# Patient Record
Sex: Female | Born: 1935
Health system: Southern US, Community
[De-identification: ages and names within clinical notes are randomized; demographics above are authoritative.]

## PROBLEM LIST (undated history)

## (undated) DIAGNOSIS — L03221 Cellulitis of neck: Secondary | ICD-10-CM

## (undated) DIAGNOSIS — D329 Benign neoplasm of meninges, unspecified: Secondary | ICD-10-CM

## (undated) DIAGNOSIS — L0211 Cutaneous abscess of neck: Secondary | ICD-10-CM

## (undated) DIAGNOSIS — I10 Essential (primary) hypertension: Secondary | ICD-10-CM

## (undated) DIAGNOSIS — N6019 Diffuse cystic mastopathy of unspecified breast: Secondary | ICD-10-CM

## (undated) DIAGNOSIS — S96919A Strain of unspecified muscle and tendon at ankle and foot level, unspecified foot, initial encounter: Secondary | ICD-10-CM

## (undated) DIAGNOSIS — J3089 Other allergic rhinitis: Secondary | ICD-10-CM

## (undated) DIAGNOSIS — L723 Sebaceous cyst: Secondary | ICD-10-CM

## (undated) HISTORY — DX: Diffuse cystic mastopathy of unspecified breast: N60.19

## (undated) HISTORY — DX: Sebaceous cyst: L72.3

## (undated) HISTORY — DX: Strain of unspecified muscle and tendon at ankle and foot level, unspecified foot, initial encounter: S96.919A

## (undated) HISTORY — DX: Essential (primary) hypertension: I10

## (undated) HISTORY — DX: Cutaneous abscess of neck: L02.11

## (undated) HISTORY — DX: Benign neoplasm of meninges, unspecified: D32.9

## (undated) HISTORY — DX: Cellulitis of neck: L03.221

## (undated) HISTORY — PX: COLONOSCOPY: SHX174

## (undated) HISTORY — PX: BREAST LUMPECTOMY: SHX2

## (undated) HISTORY — PX: TONSILLECTOMY: SUR1361

## (undated) HISTORY — PX: OTHER SURGICAL HISTORY: SHX169

## (undated) HISTORY — DX: Other allergic rhinitis: J30.89

---

## 1999-06-10 ENCOUNTER — Ambulatory Visit (HOSPITAL_COMMUNITY): Admission: RE | Admit: 1999-06-10 | Discharge: 1999-06-10 | Payer: Self-pay | Admitting: Neurosurgery

## 1999-06-11 ENCOUNTER — Encounter: Admission: RE | Admit: 1999-06-11 | Discharge: 1999-06-11 | Payer: Self-pay | Admitting: Internal Medicine

## 1999-06-11 ENCOUNTER — Encounter: Payer: Self-pay | Admitting: Internal Medicine

## 2000-06-11 ENCOUNTER — Ambulatory Visit (HOSPITAL_COMMUNITY): Admission: RE | Admit: 2000-06-11 | Discharge: 2000-06-11 | Payer: Self-pay | Admitting: Internal Medicine

## 2001-03-29 ENCOUNTER — Other Ambulatory Visit: Admission: RE | Admit: 2001-03-29 | Discharge: 2001-03-29 | Payer: Self-pay | Admitting: Obstetrics and Gynecology

## 2001-11-28 ENCOUNTER — Encounter: Payer: Self-pay | Admitting: Internal Medicine

## 2001-11-28 ENCOUNTER — Encounter: Admission: RE | Admit: 2001-11-28 | Discharge: 2001-11-28 | Payer: Self-pay | Admitting: Internal Medicine

## 2002-01-24 ENCOUNTER — Encounter: Payer: Self-pay | Admitting: Internal Medicine

## 2002-01-24 ENCOUNTER — Encounter: Admission: RE | Admit: 2002-01-24 | Discharge: 2002-01-24 | Payer: Self-pay | Admitting: Internal Medicine

## 2002-07-06 ENCOUNTER — Other Ambulatory Visit: Admission: RE | Admit: 2002-07-06 | Discharge: 2002-07-06 | Payer: Self-pay | Admitting: Obstetrics and Gynecology

## 2002-07-12 ENCOUNTER — Encounter: Payer: Self-pay | Admitting: Obstetrics and Gynecology

## 2002-07-12 ENCOUNTER — Encounter: Admission: RE | Admit: 2002-07-12 | Discharge: 2002-07-12 | Payer: Self-pay | Admitting: Obstetrics and Gynecology

## 2004-02-07 ENCOUNTER — Ambulatory Visit: Payer: Self-pay | Admitting: Family Medicine

## 2004-02-18 ENCOUNTER — Ambulatory Visit: Payer: Self-pay | Admitting: Internal Medicine

## 2004-06-12 ENCOUNTER — Ambulatory Visit: Payer: Self-pay | Admitting: Internal Medicine

## 2005-08-03 ENCOUNTER — Ambulatory Visit: Payer: Self-pay | Admitting: Internal Medicine

## 2005-08-31 ENCOUNTER — Ambulatory Visit: Payer: Self-pay | Admitting: Internal Medicine

## 2006-01-25 ENCOUNTER — Ambulatory Visit: Payer: Self-pay | Admitting: Internal Medicine

## 2006-05-10 ENCOUNTER — Ambulatory Visit: Payer: Self-pay | Admitting: Internal Medicine

## 2006-09-21 DIAGNOSIS — I10 Essential (primary) hypertension: Secondary | ICD-10-CM | POA: Insufficient documentation

## 2006-09-21 DIAGNOSIS — D320 Benign neoplasm of cerebral meninges: Secondary | ICD-10-CM | POA: Insufficient documentation

## 2006-09-21 DIAGNOSIS — N6019 Diffuse cystic mastopathy of unspecified breast: Secondary | ICD-10-CM | POA: Insufficient documentation

## 2006-10-05 ENCOUNTER — Ambulatory Visit: Payer: Self-pay | Admitting: Internal Medicine

## 2006-10-05 LAB — CONVERTED CEMR LAB
Albumin: 4.1 g/dL (ref 3.5–5.2)
Alkaline Phosphatase: 72 units/L (ref 39–117)
BUN: 9 mg/dL (ref 6–23)
Basophils Absolute: 0 10*3/uL (ref 0.0–0.1)
Cholesterol: 276 mg/dL (ref 0–200)
Direct LDL: 184.8 mg/dL
Eosinophils Absolute: 0.3 10*3/uL (ref 0.0–0.6)
GFR calc Af Amer: 91 mL/min
Hemoglobin: 14.3 g/dL (ref 12.0–15.0)
Lymphocytes Relative: 30 % (ref 12.0–46.0)
MCHC: 34.5 g/dL (ref 30.0–36.0)
Monocytes Absolute: 0.6 10*3/uL (ref 0.2–0.7)
Monocytes Relative: 8.5 % (ref 3.0–11.0)
Neutro Abs: 3.6 10*3/uL (ref 1.4–7.7)
Potassium: 3.6 meq/L (ref 3.5–5.1)
TSH: 1.42 microintl units/mL (ref 0.35–5.50)
Total CHOL/HDL Ratio: 7.7

## 2006-10-18 ENCOUNTER — Telehealth: Payer: Self-pay | Admitting: Internal Medicine

## 2006-10-19 ENCOUNTER — Telehealth: Payer: Self-pay | Admitting: Internal Medicine

## 2006-10-28 ENCOUNTER — Ambulatory Visit: Payer: Self-pay | Admitting: Internal Medicine

## 2006-10-28 DIAGNOSIS — R079 Chest pain, unspecified: Secondary | ICD-10-CM | POA: Insufficient documentation

## 2006-11-09 ENCOUNTER — Encounter: Admission: RE | Admit: 2006-11-09 | Discharge: 2006-11-09 | Payer: Self-pay | Admitting: Internal Medicine

## 2006-11-23 ENCOUNTER — Telehealth: Payer: Self-pay | Admitting: Internal Medicine

## 2007-02-08 ENCOUNTER — Ambulatory Visit: Payer: Self-pay | Admitting: Internal Medicine

## 2007-02-08 DIAGNOSIS — L723 Sebaceous cyst: Secondary | ICD-10-CM | POA: Insufficient documentation

## 2007-02-08 HISTORY — DX: Sebaceous cyst: L72.3

## 2007-03-18 ENCOUNTER — Ambulatory Visit: Payer: Self-pay | Admitting: Internal Medicine

## 2007-03-18 DIAGNOSIS — L0211 Cutaneous abscess of neck: Secondary | ICD-10-CM | POA: Insufficient documentation

## 2007-03-18 DIAGNOSIS — L03221 Cellulitis of neck: Secondary | ICD-10-CM

## 2007-03-18 HISTORY — DX: Cutaneous abscess of neck: L02.11

## 2007-08-02 ENCOUNTER — Ambulatory Visit: Payer: Self-pay

## 2007-08-02 ENCOUNTER — Encounter: Payer: Self-pay | Admitting: Internal Medicine

## 2007-08-02 ENCOUNTER — Ambulatory Visit: Payer: Self-pay | Admitting: Internal Medicine

## 2007-08-02 DIAGNOSIS — M79609 Pain in unspecified limb: Secondary | ICD-10-CM | POA: Insufficient documentation

## 2007-08-04 ENCOUNTER — Emergency Department (HOSPITAL_COMMUNITY): Admission: EM | Admit: 2007-08-04 | Discharge: 2007-08-04 | Payer: Self-pay | Admitting: Emergency Medicine

## 2007-08-05 ENCOUNTER — Telehealth: Payer: Self-pay | Admitting: Internal Medicine

## 2007-08-06 ENCOUNTER — Ambulatory Visit (HOSPITAL_COMMUNITY): Admission: RE | Admit: 2007-08-06 | Discharge: 2007-08-06 | Payer: Self-pay | Admitting: Family Medicine

## 2007-08-06 ENCOUNTER — Ambulatory Visit: Payer: Self-pay | Admitting: Family Medicine

## 2007-08-11 ENCOUNTER — Telehealth: Payer: Self-pay | Admitting: Internal Medicine

## 2007-08-24 ENCOUNTER — Encounter: Payer: Self-pay | Admitting: Internal Medicine

## 2007-09-21 ENCOUNTER — Encounter: Payer: Self-pay | Admitting: Internal Medicine

## 2007-10-27 ENCOUNTER — Encounter: Payer: Self-pay | Admitting: Internal Medicine

## 2007-12-28 ENCOUNTER — Encounter: Payer: Self-pay | Admitting: Internal Medicine

## 2008-02-08 ENCOUNTER — Encounter: Payer: Self-pay | Admitting: Internal Medicine

## 2008-04-30 ENCOUNTER — Ambulatory Visit: Payer: Self-pay | Admitting: Internal Medicine

## 2008-04-30 DIAGNOSIS — M542 Cervicalgia: Secondary | ICD-10-CM | POA: Insufficient documentation

## 2008-04-30 LAB — CONVERTED CEMR LAB
AST: 18 units/L (ref 0–37)
Basophils Absolute: 0 10*3/uL (ref 0.0–0.1)
Basophils Relative: 0.8 % (ref 0.0–3.0)
Calcium: 9.6 mg/dL (ref 8.4–10.5)
Chloride: 99 meq/L (ref 96–112)
Creatinine, Ser: 0.7 mg/dL (ref 0.4–1.2)
Eosinophils Absolute: 0.2 10*3/uL (ref 0.0–0.7)
GFR calc Af Amer: 105 mL/min
GFR calc non Af Amer: 87 mL/min
HCT: 39.2 % (ref 36.0–46.0)
HDL: 37.8 mg/dL — ABNORMAL LOW (ref >39.0)
MCHC: 35.1 g/dL (ref 30.0–36.0)
MCV: 82.3 fL (ref 78.0–100.0)
Monocytes Absolute: 0.4 10*3/uL (ref 0.1–1.0)
Neutrophils Relative %: 57.3 % (ref 43.0–77.0)
Platelets: 221 10*3/uL (ref 150–400)
RBC: 4.77 M/uL (ref 3.87–5.11)
Total Bilirubin: 1.1 mg/dL (ref 0.3–1.2)
Triglycerides: 283 mg/dL (ref 0–149)

## 2008-05-08 ENCOUNTER — Telehealth: Payer: Self-pay | Admitting: Internal Medicine

## 2008-06-06 ENCOUNTER — Telehealth: Payer: Self-pay | Admitting: Internal Medicine

## 2008-06-14 ENCOUNTER — Ambulatory Visit: Payer: Self-pay | Admitting: Internal Medicine

## 2008-06-14 DIAGNOSIS — J3089 Other allergic rhinitis: Secondary | ICD-10-CM | POA: Insufficient documentation

## 2008-06-14 DIAGNOSIS — D179 Benign lipomatous neoplasm, unspecified: Secondary | ICD-10-CM | POA: Insufficient documentation

## 2008-06-14 HISTORY — DX: Other allergic rhinitis: J30.89

## 2008-09-14 ENCOUNTER — Ambulatory Visit: Payer: Self-pay | Admitting: Internal Medicine

## 2008-09-14 DIAGNOSIS — E785 Hyperlipidemia, unspecified: Secondary | ICD-10-CM | POA: Insufficient documentation

## 2008-09-14 DIAGNOSIS — R42 Dizziness and giddiness: Secondary | ICD-10-CM | POA: Insufficient documentation

## 2008-09-14 LAB — CONVERTED CEMR LAB
BUN: 12 mg/dL (ref 6–23)
Direct LDL: 162.6 mg/dL
HDL: 36.6 mg/dL — ABNORMAL LOW (ref >39.00)
Total CHOL/HDL Ratio: 7
VLDL: 57.4 mg/dL — ABNORMAL HIGH (ref 0.0–40.0)

## 2008-09-19 ENCOUNTER — Telehealth: Payer: Self-pay | Admitting: Internal Medicine

## 2008-09-20 ENCOUNTER — Encounter: Admission: RE | Admit: 2008-09-20 | Discharge: 2008-09-20 | Payer: Self-pay | Admitting: Internal Medicine

## 2008-09-21 ENCOUNTER — Encounter: Payer: Self-pay | Admitting: Internal Medicine

## 2008-09-21 ENCOUNTER — Ambulatory Visit: Payer: Self-pay

## 2008-09-24 ENCOUNTER — Telehealth (INDEPENDENT_AMBULATORY_CARE_PROVIDER_SITE_OTHER): Payer: Self-pay | Admitting: *Deleted

## 2008-10-04 ENCOUNTER — Telehealth (INDEPENDENT_AMBULATORY_CARE_PROVIDER_SITE_OTHER): Payer: Self-pay | Admitting: *Deleted

## 2009-02-05 ENCOUNTER — Telehealth: Payer: Self-pay | Admitting: Internal Medicine

## 2009-05-06 ENCOUNTER — Telehealth: Payer: Self-pay | Admitting: Internal Medicine

## 2009-05-07 ENCOUNTER — Ambulatory Visit: Payer: Self-pay | Admitting: Internal Medicine

## 2009-05-09 ENCOUNTER — Ambulatory Visit: Payer: Self-pay | Admitting: Family Medicine

## 2009-05-09 LAB — CONVERTED CEMR LAB
Specific Gravity, Urine: >=1.03
Urobilinogen, UA: 0.2
pH: 5

## 2009-05-10 ENCOUNTER — Encounter: Payer: Self-pay | Admitting: Internal Medicine

## 2009-05-10 LAB — CONVERTED CEMR LAB
Basophils Relative: 0.7 % (ref 0.0–3.0)
Eosinophils Absolute: 0.2 10*3/uL (ref 0.0–0.7)
MCHC: 33.2 g/dL (ref 30.0–36.0)
MCV: 85.2 fL (ref 78.0–100.0)
Monocytes Absolute: 0.5 10*3/uL (ref 0.1–1.0)
Neutrophils Relative %: 59.1 % (ref 43.0–77.0)
Platelets: 226 10*3/uL (ref 150.0–400.0)
RBC: 4.92 M/uL (ref 3.87–5.11)

## 2009-05-11 ENCOUNTER — Encounter: Payer: Self-pay | Admitting: Internal Medicine

## 2009-05-14 ENCOUNTER — Telehealth: Payer: Self-pay | Admitting: Family Medicine

## 2009-06-19 ENCOUNTER — Ambulatory Visit: Payer: Self-pay | Admitting: Internal Medicine

## 2009-06-19 DIAGNOSIS — M8580 Other specified disorders of bone density and structure, unspecified site: Secondary | ICD-10-CM | POA: Insufficient documentation

## 2009-06-19 LAB — CONVERTED CEMR LAB
AST: 19 units/L (ref 0–37)
Albumin: 4.1 g/dL (ref 3.5–5.2)
Alkaline Phosphatase: 66 units/L (ref 39–117)
CO2: 31 meq/L (ref 19–32)
Chloride: 102 meq/L (ref 96–112)
Cholesterol: 257 mg/dL — ABNORMAL HIGH (ref 0–200)
Glucose, Bld: 84 mg/dL (ref 70–99)
Potassium: 3.5 meq/L (ref 3.5–5.1)
Sodium: 142 meq/L (ref 135–145)
Total CHOL/HDL Ratio: 6

## 2009-07-01 ENCOUNTER — Ambulatory Visit (HOSPITAL_COMMUNITY): Admission: RE | Admit: 2009-07-01 | Discharge: 2009-07-01 | Payer: Self-pay | Admitting: Internal Medicine

## 2009-07-01 LAB — HM MAMMOGRAPHY

## 2009-07-11 ENCOUNTER — Telehealth: Payer: Self-pay | Admitting: Internal Medicine

## 2009-07-18 ENCOUNTER — Encounter: Payer: Self-pay | Admitting: Internal Medicine

## 2009-08-14 ENCOUNTER — Telehealth: Payer: Self-pay | Admitting: Internal Medicine

## 2009-10-02 ENCOUNTER — Ambulatory Visit: Payer: Self-pay | Admitting: Family Medicine

## 2009-10-02 DIAGNOSIS — M25569 Pain in unspecified knee: Secondary | ICD-10-CM | POA: Insufficient documentation

## 2009-10-02 DIAGNOSIS — R3911 Hesitancy of micturition: Secondary | ICD-10-CM | POA: Insufficient documentation

## 2009-12-16 ENCOUNTER — Telehealth: Payer: Self-pay | Admitting: Internal Medicine

## 2010-01-14 ENCOUNTER — Ambulatory Visit: Payer: Self-pay | Admitting: Family Medicine

## 2010-01-14 ENCOUNTER — Telehealth (INDEPENDENT_AMBULATORY_CARE_PROVIDER_SITE_OTHER): Payer: Self-pay | Admitting: *Deleted

## 2010-01-14 LAB — CONVERTED CEMR LAB
Basophils Relative: 0.3 % (ref 0.0–3.0)
Eosinophils Relative: 1.7 % (ref 0.0–5.0)
HCT: 40.1 % (ref 36.0–46.0)
Hemoglobin: 13.8 g/dL (ref 12.0–15.0)
Lymphs Abs: 1.8 10*3/uL (ref 0.7–4.0)
MCV: 84.2 fL (ref 78.0–100.0)
Monocytes Absolute: 0.8 10*3/uL (ref 0.1–1.0)
RBC: 4.76 M/uL (ref 3.87–5.11)
WBC: 7.6 10*3/uL (ref 4.5–10.5)

## 2010-01-21 ENCOUNTER — Ambulatory Visit: Payer: Self-pay | Admitting: Internal Medicine

## 2010-01-21 DIAGNOSIS — R739 Hyperglycemia, unspecified: Secondary | ICD-10-CM | POA: Insufficient documentation

## 2010-01-21 DIAGNOSIS — T887XXA Unspecified adverse effect of drug or medicament, initial encounter: Secondary | ICD-10-CM | POA: Insufficient documentation

## 2010-01-21 LAB — CONVERTED CEMR LAB
ALT: 16 units/L (ref 0–35)
Albumin: 4.2 g/dL (ref 3.5–5.2)
CO2: 33 meq/L — ABNORMAL HIGH (ref 19–32)
Calcium: 9.8 mg/dL (ref 8.4–10.5)
Cholesterol: 291 mg/dL — ABNORMAL HIGH (ref 0–200)
Creatinine, Ser: 0.7 mg/dL (ref 0.4–1.2)
Creatinine,U: 64.7 mg/dL
Direct LDL: 195.7 mg/dL
Glucose, Bld: 106 mg/dL — ABNORMAL HIGH (ref 70–99)
HDL: 43.4 mg/dL (ref >39.00)
Total Bilirubin: 0.9 mg/dL (ref 0.3–1.2)
Total CHOL/HDL Ratio: 7
Triglycerides: 251 mg/dL — ABNORMAL HIGH (ref 0.0–149.0)

## 2010-03-23 ENCOUNTER — Encounter: Payer: Self-pay | Admitting: Internal Medicine

## 2010-03-24 ENCOUNTER — Encounter: Payer: Self-pay | Admitting: Internal Medicine

## 2010-04-01 NOTE — Assessment & Plan Note (Signed)
Summary: low abd pain/dm   Vital Signs:  Patient profile:   77 year old female Temp:     97.3 degrees F oral BP sitting:   140 / 80  (left arm) Cuff size:   regular  Vitals Entered By: Sid Falcon LPN (May 09, 2009 2:16 PM) CC: lower abd pain   History of Present Illness: Acute visit. Patient complains of three-day history of intermittent pain left lower abdominal region. Patient concerned about possible UTI but she's not had any symptoms of burning with urination or urinary frequency. Symptoms are fleeting usually lasting a few seconds, sharp quality, and moderate severity. No exacerbating features. No alleviating features. No history of diverticular disease. No history of kidney stones. Denies any fever, chills, or change in bowel habits.  Patient has hypertension and recent blood pressure medications changed. She has significant concerns regarding her elevated blood pressure. She is tolerating Diovan HCTZ well. Reported allergy to sulfa.  Allergies: 1)  Sulfa  Past History:  Past Medical History: Last updated: 04/30/2008 Diabetes mellitus, type II Hypertension fibercystic breast dz meningioma tendon tear of right ankle  Social History: Last updated: 09/21/2006 Retired Married Never Smoked PMH reviewed for relevance, PSH reviewed for relevance  Review of Systems  The patient denies anorexia, fever, weight loss, chest pain, melena, hematochezia, severe indigestion/heartburn, incontinence, and enlarged lymph nodes.    Physical Exam  General:  Well-developed,well-nourished,in no acute distress; alert,appropriate and cooperative throughout examination Mouth:  Oral mucosa and oropharynx without lesions or exudates.  Teeth in good repair. Neck:  No deformities, masses, or tenderness noted. Lungs:  Normal respiratory effort, chest expands symmetrically. Lungs are clear to auscultation, no crackles or wheezes. Heart:  normal rate and regular rhythm.   Abdomen:  soft,  normal bowel sounds, no distention, no masses, no guarding, no rigidity, no hepatomegaly, and no splenomegaly.  minimal tenderness to deep palpation left lower quadrant. Her tenderness is very inconsistent and seemed to diminish with repeat exam. No evidence for inguinal hernia. her minimal tenderness is almost more suprapubic just to the left side. Extremities:  no edema   Impression & Recommendations:  Problem # 1:  SUPRAPUBIC PAIN (ICD-789.09) urine dip reveals only small LE and trace blood of ?significance.  Urine cx and cover with Cipro pending results.   Doubt diverticulitis. Orders: UA Dipstick w/o Micro (manual) (16109) T-Culture, Urine (60454-09811) Venipuncture (91478) TLB-CBC Platelet - w/Differential (85025-CBCD)  Problem # 2:  HYPERTENSION (ICD-401.9) Assessment: Improved  Her updated medication list for this problem includes:    Atenolol 100 Mg Tabs (Atenolol) ..... Once daily    Diovan Hct 320-12.5 Mg Tabs (Valsartan-hydrochlorothiazide) ..... One by mouth daily  Complete Medication List: 1)  Atenolol 100 Mg Tabs (Atenolol) .... Once daily 2)  Bl Lecithin 400 Mg Caps (Lecithin) .... Once daily 3)  Omnaris 50 Mcg/act Susp (Ciclesonide) .... Tw spray q nare daily ( samples) 4)  Vitamin D 400 Unit Caps (Cholecalciferol) .Marland Kitchen.. 1 once daily 5)  Vitamin E 1000 Unit Caps (Vitamin e) .Marland Kitchen.. 1 once daily 6)  Lipoic Acid 150 Mg Caps (Alpha-lipoic acid) .Marland Kitchen.. 1 once daily 7)  Red Yeast Rice 600 Mg Tabs (Red yeast rice extract) .Marland Kitchen.. 1 once daily 8)  Diovan Hct 320-12.5 Mg Tabs (Valsartan-hydrochlorothiazide) .... One by mouth daily 9)  Ciprofloxacin Hcl 250 Mg Tabs (Ciprofloxacin hcl) .... One by mouth two times a day for 7 days  Patient Instructions: 1)  Drink plenty of fluids. 2)  Follow up promptly with Dr.  Lovell Sheehan if you have any fever or worsening symptoms. Prescriptions: CIPROFLOXACIN HCL 250 MG TABS (CIPROFLOXACIN HCL) one by mouth two times a day for 7 days  #14 x 0    Entered and Authorized by:   Evelena Peat MD   Signed by:   Evelena Peat MD on 05/09/2009   Method used:   [facility]  [address] [id number]       [address]       [place], [place]  [address]       Ph: [phone] or [phone]       Fax: [phone]   RxID:   [id number]   Laboratory Results   Urine Tests    Routine Urinalysis   Color: lt. yellow Appearance: Clear Glucose: negative   (Normal Range: Negative) Bilirubin: negative   (Normal Range: Negative) Ketone: negative   (Normal Range: Negative) Spec. Gravity: >=1.030   (Normal Range: 1.003-1.035) Blood: trace-lysed   (Normal Range: Negative) pH: 5.0   (Normal Range: 5.0-8.0) Protein: trace   (Normal Range: Negative) Urobilinogen: 0.2   (Normal Range: 0-1) Nitrite: negative   (Normal Range: Negative) Leukocyte Esterace: small   (Normal Range: Negative)    Comments: Sid Falcon LPN  May 09, 2009 2:37 PM

## 2010-04-01 NOTE — Assessment & Plan Note (Signed)
Summary: foot pain//ccm   Vital Signs:  Patient profile:   77 year old female Height:      68 inches (172.72 cm) Weight:      184.31 pounds (83.78 kg) O2 Sat:      97 % on Room air Temp:     97.6 degrees F (36.44 degrees C) oral Pulse rate:   66 / minute BP sitting:   118 / 84  (left arm) Cuff size:   regular  Vitals Entered By: Josph Macho RMA (January 14, 2010 11:06 AM)  O2 Flow:  Room air CC: Left foot pain X 2 days/ pt fell Saturday doesn't know if this affected foot or not/ CF Is Patient Diabetic? No   History of Present Illness: 77 y/o WF with c/o 2 days of gradually worsening left foot pain. Stumbled over some clutter in her home 2 days ago and had mild pain in left foot briefly.  Pain has gradually become more persistent/severe.  She did not rest her foot in the first 24h after the initial fall, but has had to limp lately and can't do her usual activities.   Denies hx of any inflammitory arthritis.  No fevers, no other joint complaints.    Current Problems (verified): 1)  Foot Pain, Left  (ICD-729.5) 2)  Knee Pain, Right  (ICD-719.46) 3)  Sinusitis, Acute  (ICD-461.9) 4)  Urinary Hesitancy  (ICD-788.64) 5)  Preventive Health Care  (ICD-V70.0) 6)  Osteopenia  (ICD-733.90) 7)  Hyperlipidemia, Atherogenic  (ICD-272.4) 8)  Dizziness, Chronic  (ICD-780.4) 9)  Allergic Rhinitis Due To Other Allergen  (ICD-477.8) 10)  Lipoma  (ICD-214.9) 11)  Neck Pain  (ICD-723.1) 12)  Leg Pain, Right  (ICD-729.5) 13)  Cellulitis and Abscess of Neck  (ICD-682.1) 14)  Sebaceous Cyst, Neck  (ICD-706.2) 15)  Chest Pain  (ICD-786.50) 16)  Preventive Health Care  (ICD-V70.0) 17)  Family History of Colon Ca 1st Degree Relative <60  (ICD-V16.0) 18)  Family History of Cad Female 1st Degree Relative <50  (ICD-V17.3) 19)  Meningioma  (ICD-225.2) 20)  Hypertension  (ICD-401.9) 21)  Diabetes Mellitus, Type II  (ICD-250.00) 22)  Fibrocystic Breast Disease  (ICD-610.1)  Current Medications  (verified): 1)  Atenolol 100 Mg Tabs (Atenolol) .... Once Daily 2)  Vitamin D 400 Unit Caps (Cholecalciferol) .Marland Kitchen.. 1 Once Daily 3)  Vitamin E 1000 Unit Caps (Vitamin E) .Marland Kitchen.. 1 Once Daily 4)  Lipoic Acid 150 Mg Caps (Alpha-Lipoic Acid) .Marland Kitchen.. 1 Once Daily 5)  Red Yeast Rice 600 Mg Tabs (Red Yeast Rice Extract) .Marland Kitchen.. 1 Once Daily 6)  Losartan Potassium-Hctz 100-12.5 Mg Tabs (Losartan Potassium-Hctz) .Marland Kitchen.. 1 Once Daily  Allergies (verified): 1)  Sulfa  Past History:  Past medical, surgical, family and social histories (including risk factors) reviewed, and no changes noted (except as noted below).  Past Medical History: Reviewed history from 04/30/2008 and no changes required. Diabetes mellitus, type II Hypertension fibercystic breast dz meningioma tendon tear of right ankle  Past Surgical History: Reviewed history from 04/30/2008 and no changes required. Lumpectomy Tonsillectomy tendon tear right achilles  Family History: Reviewed history from 09/21/2006 and no changes required. Family History of CAD Female 1st degree relative  98 Family History of Colon CA 1st degree relative <60 mother Family History of Stroke F 1st degree relative <60  Social History: Reviewed history from 09/21/2006 and no changes required. Retired Married Never Smoked  Physical Exam  General:  VS: noted, all normal. Gen: Alert, well appearing, oriented x 4.  Left foot: subtle soft tissue fullness on mid dorsum of foot, with mild/mod tenderness here and mild warmth to touch.  No erythema.  ROM of foot and ankle are intact.  Skin pink/warm.     Impression & Recommendations:  Problem # 1:  FOOT PAIN, LEFT (ICD-729.5) Contusion vs first episode of gout---contusion more likely. Joint fluid not really easily accessible in this case.  Will check uric acid, cbc, and left foot x-ray. Rx'd diclofenac 75mg  two times a day x 7d and rx'd a post op shoe to wear on left foot when bearing wt/ambulating.    Complete  Medication List: 1)  Atenolol 100 Mg Tabs (Atenolol) .... Once daily 2)  Vitamin D 400 Unit Caps (Cholecalciferol) .Marland Kitchen.. 1 once daily 3)  Vitamin E 1000 Unit Caps (Vitamin e) .Marland Kitchen.. 1 once daily 4)  Lipoic Acid 150 Mg Caps (Alpha-lipoic acid) .Marland Kitchen.. 1 once daily 5)  Red Yeast Rice 600 Mg Tabs (Red yeast rice extract) .Marland Kitchen.. 1 once daily 6)  Losartan Potassium-hctz 100-12.5 Mg Tabs (Losartan potassium-hctz) .Marland Kitchen.. 1 once daily 7)  Diclofenac Sodium 75 Mg Tbec (Diclofenac sodium) .Marland Kitchen.. 1 tab by mouth two times a day x 7d 8)  Post Op Shoe -left Foot  .... Wear when ambulating or bearing weight on left foot  Other Orders: TLB-CBC Platelet - w/Differential (85025-CBCD) TLB-Uric Acid, Blood (84550-URIC) T-Foot Left Min 3 Views (73630TC) Venipuncture (47425) Specimen Handling (95638)  Patient Instructions: 1)  Please schedule a follow-up appointment in 1 week.  Prescriptions: POST OP SHOE -LEFT FOOT Wear when ambulating or bearing weight on left foot  #1 x 0   Entered and Authorized by:   Michell Heinrich M.D.   Signed by:   Michell Heinrich M.D. on 01/14/2010   Method used:   Print then Give to Patient   RxID:   [id number] DICLOFENAC SODIUM 75 MG TBEC (DICLOFENAC SODIUM) 1 tab by mouth two times a day x 7d  #14 x 0   Entered and Authorized by:   Michell Heinrich M.D.   Signed by:   Michell Heinrich M.D. on 01/14/2010   Method used:   [facility]        [facility]  [address] [id number]       [address]       [place], [place]  [address]       Ph: [phone] or [phone]       Fax: [phone]   RxID:   [id number]    Orders Added: 1)  Est. Patient Level III [62703] 2)  TLB-CBC Platelet - w/Differential [85025-CBCD] 3)  TLB-Uric Acid, Blood [84550-URIC] 4)  T-Foot Left Min 3 Views [73630TC] 5)  Venipuncture [50093] 6)  Specimen Handling [99000]

## 2010-04-01 NOTE — Consult Note (Signed)
Summary: [facility]  [facility]   Imported By: Maryln Gottron 08/20/2009 13:46:44  _____________________________________________________________________  External Attachment:    Type:   Image     Comment:   External Document

## 2010-04-01 NOTE — Progress Notes (Signed)
Summary: Lab and xray results  Phone Note Other Incoming   Summary of Call: Please call pt and let her know that her x-ray showed NO FRACTURE.  It was a completely normal x-ray. Also, her blood tests were all normal.  Thanks--PM Initial call taken by: Michell Heinrich M.D.,  January 14, 2010 5:31 PM  Follow-up for Phone Call        Patient informed Follow-up by: Josph Macho RMA,  January 15, 2010 8:20 AM

## 2010-04-01 NOTE — Progress Notes (Signed)
Summary: lab results  Phone Note Call from Patient   Caller: Patient Call For: Stacie Glaze MD Reason for Call: Privacy/Consent Authorization Summary of Call: Pt is asking for lab results. [phone] Initial call taken by: Lynann Beaver CMA,  May 14, 2009 3:00 PM  Follow-up for Phone Call        CBC normal and urine cx neg. Follow-up by: Evelena Peat MD,  May 14, 2009 4:06 PM  Additional Follow-up for Phone Call Additional follow up Details #1::        Pt given results. Additional Follow-up by: Lynann Beaver CMA,  May 14, 2009 4:23 PM

## 2010-04-01 NOTE — Assessment & Plan Note (Signed)
Summary: SORE THROAT // RS   Vital Signs:  Patient profile:   77 year old female Height:      68 inches (172.72 cm) Weight:      184.31 pounds (83.78 kg) O2 Sat:      98 % on Room air Temp:     97.6 degrees F (36.44 degrees C) oral Pulse rate:   61 / minute BP sitting:   142 / 90  (left arm) Cuff size:   regular  Vitals Entered By: Josph Macho RMA (October 02, 2009 10:24 AM)  O2 Flow:  Room air  Serial Vital Signs/Assessments:  Time      Position  BP       Pulse  Resp  Temp     By                     142/82                         Danise Edge MD  CC: Sore throat X8 days- cough with phlegm "slightly green"/ CF Is Patient Diabetic? Yes   History of Present Illness: Patient in today with symptoms which began last Tuesday 8 days ago. She started to notice irritation in her throat initially and eventually developed actual pain, worse with swallowing but present all day to a degree. Since then she has developed worsening nasal congestion productive of green rhinorrhea, cough, PND, facial pressure and some right ear pain. She notes an intermittent cough but denies any CP/palp/SOB/wheezing or GI c/o. No n/v/diarrhea/constipation or reflux.  She does note a couple month history of urinary hesitancy, with difficulty initiating a stream at times. She denies any dysuria/hematuria/frequency/urgency/abdominal or back pain. No sensation of prolapse or incontinence. Her final c/o is some right knee pain present for a couple weeks. She reports it started after a drive to  [place] and back. She denies any trauma and does acknowledge she has had some trouble with this knee in the past. She denies any calf pain. No redness/swelling or warmth in knee.  Current Medications (verified): 1)  Atenolol 100 Mg Tabs (Atenolol) .... Once Daily 2)  Bl Lecithin 400 Mg  Caps (Lecithin) .... Once Daily 3)  Omnaris 50 Mcg/act Susp (Ciclesonide) .... Tw Spray Q Nare Daily ( Samples) 4)  Vitamin D 400 Unit  Caps (Cholecalciferol) .Marland Kitchen.. 1 Once Daily 5)  Vitamin E 1000 Unit Caps (Vitamin E) .Marland Kitchen.. 1 Once Daily 6)  Lipoic Acid 150 Mg Caps (Alpha-Lipoic Acid) .Marland Kitchen.. 1 Once Daily 7)  Red Yeast Rice 600 Mg Tabs (Red Yeast Rice Extract) .Marland Kitchen.. 1 Once Daily 8)  Micardis Hct 80-12.5 Mg Tabs (Telmisartan-Hctz) .... One By Mouth Daily  Allergies (verified): 1)  Sulfa  Past History:  Past medical history reviewed for relevance to current acute and chronic problems. Social history (including risk factors) reviewed for relevance to current acute and chronic problems.  Past Medical History: Reviewed history from 04/30/2008 and no changes required. Diabetes mellitus, type II Hypertension fibercystic breast dz meningioma tendon tear of right ankle  Social History: Reviewed history from 09/21/2006 and no changes required. Retired Married Never Smoked  Review of Systems      See HPI  Physical Exam  General:  Well-developed,well-nourished,in no acute distress; alert,appropriate and cooperative throughout examination Head:  Normocephalic and atraumatic without obvious abnormalities. Ears:  slight clear fluid behind b/l tympanic membranes. No erythema or pustular component Nose:  External nasal examination  shows no deformity or inflammation. Nasal mucosa are pink and moist without lesions or exudates. Mouth:  Oral mucosa and oropharynx without lesions or exudates.  Neck:  No deformities, masses, or tenderness noted. Lungs:  Normal respiratory effort, chest expands symmetrically. Lungs are clear to auscultation, no crackles or wheezes. Heart:  Normal rate and regular rhythm. S1 and S2 normal without gallop, murmur, click, rub or other extra sounds. Abdomen:  Bowel sounds positive,abdomen soft and non-tender without masses, organomegaly or hernias noted. Msk:  No deformity or scoliosis noted of thoracic or lumbar spine.   Extremities:  No clubbing, cyanosis, edema, or deformity noted. No swelling, warmth or  redness in knees, no joint line tenderness or ligamentous laxity. No calf swelling, redness or nodule Skin:  Intact without suspicious lesions or rashes Psych:  Cognition and judgment appear intact. Alert and cooperative with normal attention span and concentration. No apparent delusions, illusions, hallucinations   Impression & Recommendations:  Problem # 1:  SINUSITIS, ACUTE (ICD-461.9)  Her updated medication list for this problem includes:     Cefdinir 300 Mg Caps (Cefdinir) .Marland Kitchen... 1 cap by mouth two times a day x 10days    Flonase 50 Mcg/act Susp (Fluticasone propionate) .Marland Kitchen... 2 sprays each nostril daily as needed congestion Encouraged Claritin daily for the next week and then prn  Problem # 2:  URINARY HESITANCY (EXB-284.13)  Orders: Urinalysis-dipstick only (Medicare patient) (24401UU) Tried to get sample today but patient unable to provide, will treat with Cefdinir if symptoms do not resolve she may come back and leave a sample for further consideration.  Problem # 3:  HYPERTENSION (ICD-401.9)  Her updated medication list for this problem includes:    Atenolol 100 Mg Tabs (Atenolol) ..... Once daily    Micardis Hct 80-12.5 Mg Tabs (Telmisartan-hctz) ..... One by mouth daily Mild elevation with acute illness, no change in therapy today  Complete Medication List: 1)  Atenolol 100 Mg Tabs (Atenolol) .... Once daily 2)  Bl Lecithin 400 Mg Caps (Lecithin) .... Once daily 3)  Omnaris 50 Mcg/act Susp (Ciclesonide) .... Tw spray q nare daily ( samples) 4)  Vitamin D 400 Unit Caps (Cholecalciferol) .Marland Kitchen.. 1 once daily 5)  Vitamin E 1000 Unit Caps (Vitamin e) .Marland Kitchen.. 1 once daily 6)  Lipoic Acid 150 Mg Caps (Alpha-lipoic acid) .Marland Kitchen.. 1 once daily 7)  Red Yeast Rice 600 Mg Tabs (Red yeast rice extract) .Marland Kitchen.. 1 once daily 8)  Micardis Hct 80-12.5 Mg Tabs (Telmisartan-hctz) .... One by mouth daily 9)  Cefdinir 300 Mg Caps (Cefdinir) .Marland Kitchen.. 1 cap by mouth two times a day x 10days 10)  Flonase 50  Mcg/act Susp (Fluticasone propionate) .... 2 sprays each nostril daily as needed congestion  Patient Instructions: 1)  Please schedule a follow-up appointment as needed .  2)  Drink plenty of fluids up to 3-4 quarts a day. Cranberry juice is especially recommended in addition to large amounts of water. Avoid caffeine & carbonated drinks, they tend to irritate the bladder, Return in 3-5 days if you're not better: sooner if you're feeling worse.  3)  Take your antibiotic as prescribed until ALL of it is gone, but stop if you develop a rash or swelling and contact our office as soon as possible.  4)  Acute sinusitis symptoms for less than 10 days are not helped by antibiotics. Use warm moist compresses, and over the counter decongestants( only as directed). Call if no improvement in 5-7 days, sooner if increasing pain, fever,  or new symptoms.  5)  For Right knee pain, apply Aspercreme 2-3 times daily and take Tylenol (generic name is Acetaminophen) ES 500mg  tabs 2 tabs by mouth two times a day for next week and then as needed if symptoms worsen notify office 6)  Use Claritin (generic is Loratadine) 10mg  1 tab by mouth daily as needed for congestion, would take 1 daily for the next week 7)  Take nose spray, Fluticasone, 2 sprays ea nostril daily for the next week as well Prescriptions: FLONASE 50 MCG/ACT SUSP (FLUTICASONE PROPIONATE) 2 sprays each nostril daily as needed congestion  #1 bottle x 1   Entered and Authorized by:   Danise Edge MD   Signed by:   Danise Edge MD on 10/02/2009   Method used:   [facility]  [address] [id number]       [address]       [place], [place]  [address]       Ph: [phone] or [phone]       Fax: [phone]   RxID:   [id number] CEFDINIR 300 MG CAPS (CEFDINIR) 1 cap by mouth two times a day x 10days  #20 x 0   Entered and Authorized by:   Danise Edge MD   Signed by:   Danise Edge MD on 10/02/2009    Method used:   [facility]  [address] [id number]       [address]       [place], [place]  [address]       Ph: [phone] or [phone]       Fax: [phone]   RxID:   [id number]

## 2010-04-01 NOTE — Progress Notes (Signed)
Summary: seasonal allergies?  drug allergies???  Phone Note Call from Patient   Caller: Patient Call For: Stacie Glaze MD Reason for Call: Acute Illness Summary of Call: Pt is complaining of URI, cough, and nose is running non stop(clear). Sneezing........itching of neck.  Wonders if she is having allergies to her BP meds. ??  taking Diovan/Hctz, and Atenolol.   [phone] Initial call taken by: Lynann Beaver CMA,  Jul 11, 2009 3:50 PM  Follow-up for Phone Call        not from diovan-sounds like seasonal allergies per dr Lovell Sheehan- try allergy d otc-pt informed Follow-up by: Willy Eddy, LPN,  Jul 12, 2009 12:25 PM

## 2010-04-01 NOTE — Assessment & Plan Note (Signed)
Summary: PT WILL COME IN FASTING/NJR   Vital Signs:  Patient profile:   77 year old female Height:      68 inches Weight:      188 pounds BMI:     28.69 Temp:     98.3 degrees F oral Pulse rate:   76 / minute Pulse rhythm:   regular Resp:     14 per minute BP sitting:   140 / 80  (left arm)  Vitals Entered By: Willy Eddy, LPN (June 19, 2009 10:51 AM)  Nutrition Counseling: Patient's BMI is greater than 25 and therefore counseled on weight management options.  Contraindications/Deferment of Procedures/Staging:    Test/Procedure: TD vaccine    Reason for deferment: declined     Test/Procedure: Pneumovax vaccine    Reason for deferment: patient declined     Test/Procedure: FLU VAX    Reason for deferment: patient declined   Diabetic Foot Exam Foot Inspection Is there a history of a foot ulcer?              No Is there a foot ulcer now?              No Can the patient see the bottom of their feet?          Yes Are the shoes appropriate in style and fit?          Yes  Diabetic Foot Care Education Pulse Check          Right Foot          Left Foot Posterior Tibial:        normal            normal Dorsalis Pedis:        normal            normal   CC: ANNUAL VISIT FOR DISEASE MANAGEMENT-- will make appointment for gyn- no mamm in 3 years--fasting this am Pain Assessment Patient in pain? no      Nutritional Status BMI of 25 - 29 = overweight  Does patient need assistance? Functional Status Self care, Cook/clean, Shopping, Social activities Ambulation Normal Comments no depresswed, fully functional lives in two stoy house and reconizes risks need to consider one story living   CC:  ANNUAL VISIT FOR DISEASE MANAGEMENT-- will make appointment for gyn- no mamm in 3 years--fasting this am.  History of Present Illness: Here for Medicare AWV:  1.   Risk factors based on Past M, S, F history: reviewed PMH and SSH/FH 2.   Physical Activities:  see assement 3.    Depression/mood: not depressed 4.   Hearing:  notmal 5.   ADL's:  can function normally 6.   Fall Risk: none assessed 7.   Home Safety: reviewed, lives in two story house, but lives on first floor nly 8.   Height, weight, &visual acuity:see virtals 9.   Counseling: weigtht, adherance and immunizations 10.   Labs ordered based on risk factors:  see orders 11.           Referral Coordination  nees a referral to to the eye specialist [facility] 12.           Care Plan weight loss, eye exam, med adherance  congitive function in the PE   Preventive Screening-Counseling & Management  Alcohol-Tobacco     Smoking Status: never  Caffeine-Diet-Exercise     Diet Comments: normal     Does Patient Exercise: yes  Type of exercise: walks     [facility] Depression Score: not depressed  Current Medications (verified): 1)  Atenolol 100 Mg Tabs (Atenolol) .... Once Daily 2)  Bl Lecithin 400 Mg  Caps (Lecithin) .... Once Daily 3)  Omnaris 50 Mcg/act Susp (Ciclesonide) .... Tw Spray Q Nare Daily ( Samples) 4)  Vitamin D 400 Unit Caps (Cholecalciferol) .Marland Kitchen.. 1 Once Daily 5)  Vitamin E 1000 Unit Caps (Vitamin E) .Marland Kitchen.. 1 Once Daily 6)  Lipoic Acid 150 Mg Caps (Alpha-Lipoic Acid) .Marland Kitchen.. 1 Once Daily 7)  Red Yeast Rice 600 Mg Tabs (Red Yeast Rice Extract) .Marland Kitchen.. 1 Once Daily 8)  Diovan Hct 320-12.5 Mg Tabs (Valsartan-Hydrochlorothiazide) .... One By Mouth Daily 9)  Ciprofloxacin Hcl 250 Mg Tabs (Ciprofloxacin Hcl) .... One By Mouth Two Times A Day For 7 Days  Allergies (verified): 1)  Sulfa  Social History: Does Patient Exercise:  yes  Physical Exam  Psych:  Oriented X3, memory intact for recent and remote, normally interactive, good eye contact, not anxious appearing, not depressed appearing, not suicidal, and not homicidal.     Impression & Recommendations:  Problem # 1:  OTHER SCREENING MAMMOGRAM (ICD-V76.12) referred pt agrees to go Orders: Radiology Referral (Radiology)  Problem #  2:  HYPERLIPIDEMIA, ATHEROGENIC (ICD-272.4) screening is appropriate today Orders: TLB-Lipid Panel (80061-LIPID)  Problem # 3:  HYPERTENSION (ICD-401.9)  monitering bemt and keep pressure log Her updated medication list for this problem includes:    Atenolol 100 Mg Tabs (Atenolol) ..... Once daily    Diovan Hct 320-12.5 Mg Tabs (Valsartan-hydrochlorothiazide) ..... One by mouth daily  BP today: 140/80 Prior BP: 140/80 (05/09/2009)  Prior 10 Yr Risk Heart Disease: Not enough information (02/08/2007)  Labs Reviewed: K+: 3.4 (04/30/2008) Creat: : 0.7 (09/14/2008)   Chol: 253 (09/14/2008)   HDL: 36.60 (09/14/2008)   LDL: DEL (04/30/2008)   TG: 287.0 (09/14/2008)  Orders: T-Urinalysis (81003-65000) TLB-BMP (Basic Metabolic Panel-BMET) (80048-METABOL)  Problem # 4:  DIABETES MELLITUS, TYPE II (ICD-250.00) monitering screening a1c  and urine screeen Her updated medication list for this problem includes:    Diovan Hct 320-12.5 Mg Tabs (Valsartan-hydrochlorothiazide) ..... One by mouth daily  Orders: TLB-A1C / Hgb A1C (Glycohemoglobin) (83036-A1C) Ophthalmology Referral (Ophthalmology) TLB-Microalbumin/Creat Ratio, Urine (82043-MALB)  Labs Reviewed: Creat: 0.7 (09/14/2008)    Reviewed HgBA1c results: 5.9 (10/05/2006)  Problem # 5:  Preventive Health Care (ICD-V70.0) Assessment: Unchanged  reveiwed protocol for preventative visit The pt was asked about all immunizations, health maint. services that are appropriate to their age and was given guidance on diet exercize  and weight management  Colonoscopy: Results: Normal. Results: Hemorrhoids.      (05/25/2000) Chol: 253 (09/14/2008)   HDL: 36.60 (09/14/2008)   LDL: DEL (04/30/2008)   TG: 287.0 (09/14/2008) TSH: 1.06 (04/30/2008)   HgbA1C: 5.9 (10/05/2006)   Next Colonoscopy due:: 06/2010 (09/21/2006)  Discussed using sunscreen, use of alcohol, drug use, self breast exam, routine dental care, routine eye care, schedule for GYN  exam, routine physical exam, seat belts, multiple vitamins, osteoporosis prevention, adequate calcium intake in diet, recommendations for immunizations, mammograms and Pap smears.  Discussed exercise and checking cholesterol.  Discussed gun safety, safe sex, and contraception.  Orders: First annual wellness visit with prevention plan  [phone])  Complete Medication List: 1)  Atenolol 100 Mg Tabs (Atenolol) .... Once daily 2)  Bl Lecithin 400 Mg Caps (Lecithin) .... Once daily 3)  Omnaris 50 Mcg/act Susp (Ciclesonide) .... Tw spray q nare daily ( samples) 4)  Vitamin D 400 Unit Caps (Cholecalciferol) .Marland Kitchen.. 1 once daily 5)  Vitamin E 1000 Unit Caps (Vitamin e) .Marland Kitchen.. 1 once daily 6)  Lipoic Acid 150 Mg Caps (Alpha-lipoic acid) .Marland Kitchen.. 1 once daily 7)  Red Yeast Rice 600 Mg Tabs (Red yeast rice extract) .Marland Kitchen.. 1 once daily 8)  Diovan Hct 320-12.5 Mg Tabs (Valsartan-hydrochlorothiazide) .... One by mouth daily  Other Orders: TLB-Hepatic/Liver Function Pnl (80076-HEPATIC) T-Vitamin D (25-Hydroxy) ([id number])  Patient Instructions: 1)  Please schedule a follow-up appointment in 6 months.       Prevention & Chronic Care Immunizations   Influenza vaccine: Not documented   Influenza vaccine deferral: patient declined  (06/19/2009)    Tetanus booster: Not documented   Td booster deferral: declined  (06/19/2009)    Pneumococcal vaccine: Not documented   Pneumococcal vaccine deferral: patient declined  (06/19/2009)    H. zoster vaccine: Not documented   H. zoster vaccine deferral: Refused  (06/19/2009)    Immunization comments: reefused immunizations and we counsilled about risks... still refused  Colorectal Screening   Hemoccult: Not documented    Colonoscopy: Results: Normal. Results: Hemorrhoids.       (05/25/2000)   Colonoscopy action/deferral: Repeat colonoscopy in 10 years.    (05/25/2000)   Colonoscopy due: 06/2010  Other Screening   Pap smear: Not documented   Pap smear  action/deferral: GYN referral  (06/19/2009)    Mammogram: Not documented   Mammogram action/deferral: Ordered  (06/19/2009)    DXA bone density scan: Not documented   DXA bone density action/deferral: Refused  (06/19/2009)   Smoking status: never  (06/19/2009)    Screening comments: she is resistant to screening exams  " go wil take care of me"  Diabetes Mellitus   HgbA1C: 5.9  (10/05/2006)   HgbA1C action/deferral: Ordered  (06/19/2009)    Eye exam: Not documented   Diabetic eye exam action/deferral: Ophthalmology referral  (06/19/2009)    Foot exam: Not documented   Foot exam action/deferral: Do today   High risk foot: Not documented   Foot care education: Not documented    Urine microalbumin/creatinine ratio: Not documented  Lipids   Total Cholesterol: 253  (09/14/2008)   Lipid panel action/deferral: Lipid Panel ordered   LDL: DEL  (04/30/2008)   LDL Direct: 162.6  (09/14/2008)   HDL: 36.60  (09/14/2008)   Triglycerides: 287.0  (09/14/2008)   Lipid panel due: 06/20/2010    SGOT (AST): 18  (04/30/2008)   BMP action: Ordered   SGPT (ALT): 21  (04/30/2008)   Alkaline phosphatase: 77  (04/30/2008)   Total bilirubin: 1.1  (04/30/2008)   Liver panel due: 06/20/2010    Lipid flowsheet reviewed?: Yes   Progress toward LDL goal: Deteriorated    Stage of readiness to change (lipid management): Precontemplation  Hypertension   Last Blood Pressure: 140 / 80  (06/19/2009)   Serum creatinine: 0.7  (09/14/2008)   Serum potassium 3.4  (04/30/2008)    Hypertension flowsheet reviewed?: Yes   Progress toward BP goal: At goal  Self-Management Support :    Patient will work on the following items until the next clinic visit to reach self-care goals:     Medications and monitoring: take my medicines every day  (06/19/2009)     Eating: eat more vegetables, eat foods that are low in salt  (06/19/2009)     Activity: take a 30 minute walk every day  (06/19/2009)    Diabetes  self-management support: CBG self-monitoring log, Written self-care plan  (06/19/2009)  Diabetes care plan printed    Hypertension self-management support: BP self-monitoring log  (06/19/2009)    Lipid self-management support: Lipid monitoring log  (06/19/2009)    Nursing Instructions: Schedule screening mammogram (see order) HgbA1C today (see order) Refer for screening diabetic eye exam (see order) Diabetic foot exam today   Appended Document: PT WILL COME IN FASTING/NJR     Vital Signs:  Patient profile:   77 year old female BP sitting:   140 / 80 CC: Type 2 diabetes mellitus follow-up  Vision Screening:Right eye with correction: 20 / 20 Both eyes with correction: 20 / 20  Color vision testing: normal     40db HL: Left  Right  Audiometry Comment: NORMAL    CC:  Type 2 diabetes mellitus follow-up.  History of Present Illness: see vitals in the appended document   Type 2 Diabetes Mellitus Follow-Up      This is a 77 year old woman who presents for Type 2 diabetes mellitus follow-up.  high risk for dm DIET CONTROLLED WITH A1C SCREENONG AND WEIGHT GUIDANCE.  The patient denies polyuria, polydipsia, blurred vision, self managed hypoglycemia, hypoglycemia requiring help, weight loss, weight gain, and numbness of extremities.  The patient denies the following symptoms: neuropathic pain, chest pain, vomiting, orthostatic symptoms, poor wound healing, intermittent claudication, vision loss, and foot ulcer.  Since the last visit the patient reports poor dietary compliance, noncompliance with medications, not exercising regularly, and not monitoring blood glucose.    Lipid Management History:      Positive NCEP/ATP III risk factors include female age 59 years old or older, diabetes, HDL cholesterol less than 40, and hypertension.  Negative NCEP/ATP III risk factors include non-tobacco-user status, no ASHD (atherosclerotic heart disease), no prior stroke/TIA, no peripheral  vascular disease, and no history of aortic aneurysm.     Problems Prior to Update: 1)  Preventive Health Care  (ICD-V70.0) 2)  Osteopenia  (ICD-733.90) 3)  Other Screening Mammogram  (ICD-V76.12) 4)  Hyperlipidemia, Atherogenic  (ICD-272.4) 5)  Dizziness, Chronic  (ICD-780.4) 6)  Allergic Rhinitis Due To Other Allergen  (ICD-477.8) 7)  Lipoma  (ICD-214.9) 8)  Neck Pain  (ICD-723.1) 9)  Leg Pain, Right  (ICD-729.5) 10)  Cellulitis and Abscess of Neck  (ICD-682.1) 11)  Sebaceous Cyst, Neck  (ICD-706.2) 12)  Chest Pain  (ICD-786.50) 13)  Preventive Health Care  (ICD-V70.0) 14)  Family History of Colon Ca 1st Degree Relative <60  (ICD-V16.0) 15)  Family History of Cad Female 1st Degree Relative <50  (ICD-V17.3) 16)  Meningioma  (ICD-225.2) 17)  Hypertension  (ICD-401.9) 18)  Diabetes Mellitus, Type II  (ICD-250.00) 19)  Fibrocystic Breast Disease  (ICD-610.1)  Medications Prior to Update: 1)  Atenolol 100 Mg Tabs (Atenolol) .... Once Daily 2)  Bl Lecithin 400 Mg  Caps (Lecithin) .... Once Daily 3)  Omnaris 50 Mcg/act Susp (Ciclesonide) .... Tw Spray Q Nare Daily ( Samples) 4)  Vitamin D 400 Unit Caps (Cholecalciferol) .Marland Kitchen.. 1 Once Daily 5)  Vitamin E 1000 Unit Caps (Vitamin E) .Marland Kitchen.. 1 Once Daily 6)  Lipoic Acid 150 Mg Caps (Alpha-Lipoic Acid) .Marland Kitchen.. 1 Once Daily 7)  Red Yeast Rice 600 Mg Tabs (Red Yeast Rice Extract) .Marland Kitchen.. 1 Once Daily 8)  Diovan Hct 320-12.5 Mg Tabs (Valsartan-Hydrochlorothiazide) .... One By Mouth Daily  Current Medications (verified): 1)  Atenolol 100 Mg Tabs (Atenolol) .... Once Daily 2)  Bl Lecithin 400 Mg  Caps (Lecithin) .... Once Daily 3)  Omnaris 50 Mcg/act Susp (  Ciclesonide) .... Tw Spray Q Nare Daily ( Samples) 4)  Vitamin D 400 Unit Caps (Cholecalciferol) .Marland Kitchen.. 1 Once Daily 5)  Vitamin E 1000 Unit Caps (Vitamin E) .Marland Kitchen.. 1 Once Daily 6)  Lipoic Acid 150 Mg Caps (Alpha-Lipoic Acid) .Marland Kitchen.. 1 Once Daily 7)  Red Yeast Rice 600 Mg Tabs (Red Yeast Rice Extract) .Marland Kitchen.. 1  Once Daily 8)  Diovan Hct 320-12.5 Mg Tabs (Valsartan-Hydrochlorothiazide) .... One By Mouth Daily  Allergies (verified): 1)  Sulfa  Past History:  Family History: Last updated: 09/21/2006 Family History of CAD Female 1st degree relative  64 Family History of Colon CA 1st degree relative <60 mother Family History of Stroke F 1st degree relative <60  Social History: Last updated: 09/21/2006 Retired Married Never Smoked  Risk Factors: Diet: normal (06/19/2009) Exercise: yes (06/19/2009)  Risk Factors: Smoking Status: never (06/19/2009)  Past medical, surgical, family and social histories (including risk factors) reviewed, and no changes noted (except as noted below).  Past Medical History: Reviewed history from 04/30/2008 and no changes required. Diabetes mellitus, type II Hypertension fibercystic breast dz meningioma tendon tear of right ankle  Past Surgical History: Reviewed history from 04/30/2008 and no changes required. Lumpectomy Tonsillectomy tendon tear right achilles  Family History: Reviewed history from 09/21/2006 and no changes required. Family History of CAD Female 1st degree relative  7 Family History of Colon CA 1st degree relative <60 mother Family History of Stroke F 1st degree relative <60  Social History: Reviewed history from 09/21/2006 and no changes required. Retired Married Never Smoked  Review of Systems  The patient denies anorexia, fever, weight loss, weight gain, vision loss, decreased hearing, hoarseness, chest pain, syncope, dyspnea on exertion, peripheral edema, prolonged cough, headaches, hemoptysis, abdominal pain, melena, hematochezia, severe indigestion/heartburn, hematuria, incontinence, genital sores, muscle weakness, suspicious skin lesions, transient blindness, difficulty walking, depression, unusual weight change, abnormal bleeding, enlarged lymph nodes, angioedema, and breast masses.    Physical Exam  General:   Well-developed,well-nourished,in no acute distress; alert,appropriate and cooperative throughout examination Head:  normocephalic, atraumatic, and no abnormalities observed.  no sinus tenderness  Eyes:  vision grossly intact and pupils reactive to light.   Ears:  R ear normal and L ear normal.   Neck:  full ROM.   Lungs:  normal respiratory effort, no dullness, and no wheezes.   Heart:  normal rate and regular rhythm.   Abdomen:  soft and non-tender.     Impression & Recommendations:  Problem # 1:  HYPERTENSION (ICD-401.9)  Her updated medication list for this problem includes:    Atenolol 100 Mg Tabs (Atenolol) ..... Once daily    Diovan Hct 320-12.5 Mg Tabs (Valsartan-hydrochlorothiazide) ..... One by mouth daily  BP today: 140/80 Prior BP: 140/80 (06/19/2009)  Prior 10 Yr Risk Heart Disease: Not enough information (02/08/2007)  Labs Reviewed: K+: 3.4 (04/30/2008) Creat: : 0.7 (09/14/2008)   Chol: 253 (09/14/2008)   HDL: 36.60 (09/14/2008)   LDL: DEL (04/30/2008)   TG: 287.0 (09/14/2008)  Problem # 2:  DIABETES MELLITUS, TYPE II (ICD-250.00) MONITER A1C. WEIGHT LOSS GOAL Her updated medication list for this problem includes:    Diovan Hct 320-12.5 Mg Tabs (Valsartan-hydrochlorothiazide) ..... One by mouth daily  Labs Reviewed: Creat: 0.7 (09/14/2008)    Reviewed HgBA1c results: 5.9 (10/05/2006)  Problem # 3:  HYPERLIPIDEMIA, ATHEROGENIC (ICD-272.4) PT REFUSED MEDS AND WAS BEEN TRYING DEIT ONLY WE ASKED TO CONSIDER FISH OIL AD NIACIN Labs Reviewed: SGOT: 18 (04/30/2008)   SGPT: 21 (04/30/2008)  Lipid  Goals: Chol Goal: 200 (06/19/2009)   HDL Goal: 40 (06/19/2009)   LDL Goal: 100 (06/19/2009)   TG Goal: 150 (06/19/2009)  Prior 10 Yr Risk Heart Disease: Not enough information (02/08/2007)   HDL:36.60 (09/14/2008), 37.8 (04/30/2008)  LDL:DEL (04/30/2008), DEL (10/05/2006)  Chol:253 (09/14/2008), 296 (04/30/2008)  Trig:287.0 (09/14/2008), 283 (04/30/2008)  Complete  Medication List: 1)  Atenolol 100 Mg Tabs (Atenolol) .... Once daily 2)  Bl Lecithin 400 Mg Caps (Lecithin) .... Once daily 3)  Omnaris 50 Mcg/act Susp (Ciclesonide) .... Tw spray q nare daily ( samples) 4)  Vitamin D 400 Unit Caps (Cholecalciferol) .Marland Kitchen.. 1 once daily 5)  Vitamin E 1000 Unit Caps (Vitamin e) .Marland Kitchen.. 1 once daily 6)  Lipoic Acid 150 Mg Caps (Alpha-lipoic acid) .Marland Kitchen.. 1 once daily 7)  Red Yeast Rice 600 Mg Tabs (Red yeast rice extract) .Marland Kitchen.. 1 once daily 8)  Diovan Hct 320-12.5 Mg Tabs (Valsartan-hydrochlorothiazide) .... One by mouth daily  Lipid Assessment/Plan:      Based on NCEP/ATP III, the patient's risk factor category is "history of diabetes".  The patient's lipid goals are as follows: Total cholesterol goal is 200; LDL cholesterol goal is 100; HDL cholesterol goal is 40; Triglyceride goal is 150.  Her LDL cholesterol goal has not been met.  Secondary causes for hyperlipidemia have been ruled out.  She has been counseled on adjunctive measures for lowering her cholesterol and has been provided with dietary instructions.    Appended Document: PT WILL COME IN FASTING/NJR  Laboratory Results   Urine Tests    Routine Urinalysis   Color: yellow Appearance: Clear Glucose: negative   (Normal Range: Negative) Bilirubin: negative   (Normal Range: Negative) Ketone: negative   (Normal Range: Negative) Spec. Gravity: 1.015   (Normal Range: 1.003-1.035) Blood: negative   (Normal Range: Negative) pH: 6.0   (Normal Range: 5.0-8.0) Protein: negative   (Normal Range: Negative) Urobilinogen: 0.2   (Normal Range: 0-1) Nitrite: negative   (Normal Range: Negative) Leukocyte Esterace: trace   (Normal Range: Negative)    Comments: Rita Ohara  June 19, 2009 1:12 PM

## 2010-04-01 NOTE — Assessment & Plan Note (Signed)
Summary: 6 month ov//ccm   Vital Signs:  Patient profile:   77 year old female Height:      68 inches Weight:      182 pounds BMI:     27.77 Temp:     98.1 degrees F oral Pulse rate:   72 / minute Resp:     14 per minute BP sitting:   130 / 80  (left arm)  Vitals Entered By: Willy Eddy, LPN (January 21, 2010 10:33 AM)  Nutrition Counseling: Patient's BMI is greater than 25 and therefore counseled on weight management options. CC: roa labs and recheck from ov with dr Marvel Plan- foot pain has subsided, Hypertension Management Is Patient Diabetic? No   Primary Care Provider:  Stacie Glaze MD  CC:  roa labs and recheck from ov with dr Marvel Plan- foot pain has subsided and Hypertension Management.  History of Present Illness: Foot pain and [place] from fall... xray normal and pain resolving she has not  been diagnosed with DM A1c was 5.9 we have screened her due to fm hx   Hypertension History:      She denies headache, chest pain, palpitations, dyspnea with exertion, orthopnea, PND, peripheral edema, visual symptoms, neurologic problems, syncope, and side effects from treatment.        Positive major cardiovascular risk factors include female age 61 years old or older, diabetes, hyperlipidemia, and hypertension.  Negative major cardiovascular risk factors include non-tobacco-user status.        Further assessment for target organ damage reveals no history of ASHD, stroke/TIA, or peripheral vascular disease.     Preventive Screening-Counseling & Management  Alcohol-Tobacco     Smoking Status: never  Problems Prior to Update: 1)  Foot Pain, Left  (ICD-729.5) 2)  Knee Pain, Right  (ICD-719.46) 3)  Sinusitis, Acute  (ICD-461.9) 4)  Urinary Hesitancy  (ICD-788.64) 5)  Preventive Health Care  (ICD-V70.0) 6)  Osteopenia  (ICD-733.90) 7)  Hyperlipidemia, Atherogenic  (ICD-272.4) 8)  Dizziness, Chronic  (ICD-780.4) 9)  Allergic Rhinitis Due To Other Allergen   (ICD-477.8) 10)  Lipoma  (ICD-214.9) 11)  Neck Pain  (ICD-723.1) 12)  Leg Pain, Right  (ICD-729.5) 13)  Cellulitis and Abscess of Neck  (ICD-682.1) 14)  Sebaceous Cyst, Neck  (ICD-706.2) 15)  Chest Pain  (ICD-786.50) 16)  Preventive Health Care  (ICD-V70.0) 17)  Family History of Colon Ca 1st Degree Relative <60  (ICD-V16.0) 18)  Family History of Cad Female 1st Degree Relative <50  (ICD-V17.3) 19)  Meningioma  (ICD-225.2) 20)  Hypertension  (ICD-401.9) 21)  Diabetes Mellitus, Type II  (ICD-250.00) 22)  Fibrocystic Breast Disease  (ICD-610.1)  Current Problems (verified): 1)  Foot Pain, Left  (ICD-729.5) 2)  Knee Pain, Right  (ICD-719.46) 3)  Sinusitis, Acute  (ICD-461.9) 4)  Urinary Hesitancy  (ICD-788.64) 5)  Preventive Health Care  (ICD-V70.0) 6)  Osteopenia  (ICD-733.90) 7)  Hyperlipidemia, Atherogenic  (ICD-272.4) 8)  Dizziness, Chronic  (ICD-780.4) 9)  Allergic Rhinitis Due To Other Allergen  (ICD-477.8) 10)  Lipoma  (ICD-214.9) 11)  Neck Pain  (ICD-723.1) 12)  Leg Pain, Right  (ICD-729.5) 13)  Cellulitis and Abscess of Neck  (ICD-682.1) 14)  Sebaceous Cyst, Neck  (ICD-706.2) 15)  Chest Pain  (ICD-786.50) 16)  Preventive Health Care  (ICD-V70.0) 17)  Family History of Colon Ca 1st Degree Relative <60  (ICD-V16.0) 18)  Family History of Cad Female 1st Degree Relative <50  (ICD-V17.3) 19)  Meningioma  (ICD-225.2) 20)  Hypertension  (  ICD-401.9) 21)  Diabetes Mellitus, Type II  (ICD-250.00) 22)  Fibrocystic Breast Disease  (ICD-610.1)  Medications Prior to Update: 1)  Atenolol 100 Mg Tabs (Atenolol) .... Once Daily 2)  Vitamin D 400 Unit Caps (Cholecalciferol) .Marland Kitchen.. 1 Once Daily 3)  Vitamin E 1000 Unit Caps (Vitamin E) .Marland Kitchen.. 1 Once Daily 4)  Lipoic Acid 150 Mg Caps (Alpha-Lipoic Acid) .Marland Kitchen.. 1 Once Daily 5)  Red Yeast Rice 600 Mg Tabs (Red Yeast Rice Extract) .Marland Kitchen.. 1 Once Daily 6)  Losartan Potassium-Hctz 100-12.5 Mg Tabs (Losartan Potassium-Hctz) .Marland Kitchen.. 1 Once Daily 7)   Diclofenac Sodium 75 Mg Tbec (Diclofenac Sodium) .Marland Kitchen.. 1 Tab By Mouth Two Times A Day X 7d 8)  Post Op Shoe -Left Foot .... Wear When Ambulating or Bearing Weight On Left Foot  Current Medications (verified): 1)  Atenolol 100 Mg Tabs (Atenolol) .... Once Daily 2)  Vitamin D 400 Unit Caps (Cholecalciferol) .Marland Kitchen.. 1 Once Daily 3)  Vitamin E 1000 Unit Caps (Vitamin E) .Marland Kitchen.. 1 Once Daily 4)  Lipoic Acid 150 Mg Caps (Alpha-Lipoic Acid) .Marland Kitchen.. 1 Once Daily 5)  Red Yeast Rice 600 Mg Tabs (Red Yeast Rice Extract) .Marland Kitchen.. 1 Once Daily 6)  Losartan Potassium-Hctz 100-12.5 Mg Tabs (Losartan Potassium-Hctz) .Marland Kitchen.. 1 Once Daily  Allergies (verified): 1)  Sulfa  Past History:  Family History: Last updated: 09/21/2006 Family History of CAD Female 1st degree relative  46 Family History of Colon CA 1st degree relative <60 mother Family History of Stroke F 1st degree relative <60  Social History: Last updated: 09/21/2006 Retired Married Never Smoked  Risk Factors: Diet: normal (06/19/2009) Exercise: yes (06/19/2009)  Risk Factors: Smoking Status: never (01/21/2010)  Past medical, surgical, family and social histories (including risk factors) reviewed, and no changes noted (except as noted below).  Past Medical History: Reviewed history from 04/30/2008 and no changes required. Diabetes mellitus, type II Hypertension fibercystic breast dz meningioma tendon tear of right ankle  Past Surgical History: Reviewed history from 04/30/2008 and no changes required. Lumpectomy Tonsillectomy tendon tear right achilles  Family History: Reviewed history from 09/21/2006 and no changes required. Family History of CAD Female 1st degree relative  32 Family History of Colon CA 1st degree relative <60 mother Family History of Stroke F 1st degree relative <60  Social History: Reviewed history from 09/21/2006 and no changes required. Retired Married Never Smoked  Review of Systems  The patient denies  anorexia, fever, weight loss, weight gain, vision loss, decreased hearing, hoarseness, chest pain, syncope, dyspnea on exertion, peripheral edema, prolonged cough, headaches, hemoptysis, abdominal pain, melena, hematochezia, severe indigestion/heartburn, hematuria, incontinence, genital sores, muscle weakness, suspicious skin lesions, transient blindness, difficulty walking, depression, unusual weight change, abnormal bleeding, enlarged lymph nodes, angioedema, and breast masses.    Physical Exam  General:  VS: noted, all normal. Gen: Alert, well appearing, oriented x 4. Left foot: subtle soft tissue fullness on mid dorsum of foot, with mild/mod tenderness here and mild warmth to touch.  No erythema.  ROM of foot and ankle are intact.  Skin pink/warm.   Head:  Normocephalic and atraumatic without obvious abnormalities. Eyes:  vision grossly intact and pupils reactive to light.   Ears:  slight clear fluid behind b/l tympanic membranes. No erythema or pustular component Nose:  External nasal examination shows no deformity or inflammation. Nasal mucosa are pink and moist without lesions or exudates. Mouth:  Oral mucosa and oropharynx without lesions or exudates.  Neck:  No deformities, masses, or tenderness noted. Lungs:  Normal respiratory effort, chest expands symmetrically. Lungs are clear to auscultation, no crackles or wheezes. Heart:  Normal rate and regular rhythm. S1 and S2 normal without gallop, murmur, click, rub or other extra sounds. Abdomen:  Bowel sounds positive,abdomen soft and non-tender without masses, organomegaly or hernias noted.   Impression & Recommendations:  Problem # 1:  HYPERLIPIDEMIA, ATHEROGENIC (ICD-272.4) Assessment Unchanged  on red rice yeast she refuled the statins need to moniter LDLc  Labs Reviewed: SGOT: 19 (06/19/2009)   SGPT: 22 (06/19/2009)  Lipid Goals: Chol Goal: 200 (06/19/2009)   HDL Goal: 40 (06/19/2009)   LDL Goal: 100 (06/19/2009)   TG Goal:  150 (06/19/2009)  Prior 10 Yr Risk Heart Disease: Not enough information (02/08/2007)   HDL:43.50 (06/19/2009), 36.60 (09/14/2008)  LDL:DEL (04/30/2008), DEL (10/05/2006)  Chol:257 (06/19/2009), 253 (09/14/2008)  Trig:249.0 (06/19/2009), 287.0 (09/14/2008)  Orders: TLB-Lipid Panel (80061-LIPID)  Problem # 2:  HYPERTENSION (ICD-401.9) Assessment: Unchanged  Her updated medication list for this problem includes:    Atenolol 100 Mg Tabs (Atenolol) ..... Once daily    Losartan Potassium-hctz 100-12.5 Mg Tabs (Losartan potassium-hctz) .Marland Kitchen... 1 once daily  BP today: 130/80 Prior BP: 118/84 (01/14/2010)  Prior 10 Yr Risk Heart Disease: Not enough information (02/08/2007)  Labs Reviewed: K+: 3.5 (06/19/2009) Creat: : 0.7 (06/19/2009)   Chol: 257 (06/19/2009)   HDL: 43.50 (06/19/2009)   LDL: DEL (04/30/2008)   TG: 249.0 (06/19/2009)  Problem # 3:  LEG PAIN, RIGHT (ICD-729.5) Assessment: Improved resolving  Problem # 4:  HYPERGLYCEMIA, BORDERLINE (ICD-790.29) Assessment: Unchanged  Orders: Fingerstick (16109) TLB-BMP (Basic Metabolic Panel-BMET) (80048-METABOL) TLB-A1C / Hgb A1C (Glycohemoglobin) (83036-A1C) TLB-Microalbumin/Creat Ratio, Urine (82043-MALB)  Complete Medication List: 1)  Atenolol 100 Mg Tabs (Atenolol) .... Once daily 2)  Vitamin D 400 Unit Caps (Cholecalciferol) .Marland Kitchen.. 1 once daily 3)  Vitamin E 1000 Unit Caps (Vitamin e) .Marland Kitchen.. 1 once daily 4)  Lipoic Acid 150 Mg Caps (Alpha-lipoic acid) .Marland Kitchen.. 1 once daily 5)  Red Yeast Rice 600 Mg Tabs (Red yeast rice extract) .Marland Kitchen.. 1 once daily 6)  Losartan Potassium-hctz 100-12.5 Mg Tabs (Losartan potassium-hctz) .Marland Kitchen.. 1 once daily  Other Orders: TLB-Hepatic/Liver Function Pnl (80076-HEPATIC)  Hypertension Assessment/Plan:      The patient's hypertensive risk group is category C: Target organ damage and/or diabetes.  Today's blood pressure is 130/80.  Her blood pressure goal is < 130/80.  Patient Instructions: 1)  Please  schedule a follow-up appointment in 4 months.   Orders Added: 1)  Fingerstick [36416] 2)  TLB-BMP (Basic Metabolic Panel-BMET) [80048-METABOL] 3)  TLB-A1C / Hgb A1C (Glycohemoglobin) [83036-A1C] 4)  TLB-Microalbumin/Creat Ratio, Urine [82043-MALB] 5)  TLB-Lipid Panel [80061-LIPID] 6)  Est. Patient Level IV [60454] 7)  TLB-Hepatic/Liver Function Pnl [80076-HEPATIC]  Appended Document: Orders Update     Clinical Lists Changes  Orders: Added new Service order of Venipuncture (09811) - Signed Added new Service order of Specimen Handling (91478) - Signed

## 2010-04-01 NOTE — Progress Notes (Signed)
Summary: micardis  Phone Note Call from Patient Call back at Home Phone [phone]   Reason for Call: Acute Illness Summary of Call: Wants to go back on Micardis instead of Diovan, samples.  She feels the other worked better.  Unsteady Micardis, then losartan cheaper & got unsteady, then diovan.  Even though not cheaper, wants to go back on Micardis.  [facility]  NKDA except one antibiotic when she was 9 that is on record.   Initial call taken by: Rudy Jew, RN,  August 14, 2009 4:11 PM  Follow-up for Phone Call        has not been on micardis since 2008 Follow-up by: Willy Eddy, LPN,  August 14, 2009 4:15 PM  Additional Follow-up for Phone Call Additional follow up Details #1::        sent in but she wiull have to pay for this it is not on formulary Additional Follow-up by: Stacie Glaze MD,  August 14, 2009 5:01 PM    Additional Follow-up for Phone Call Additional follow up Details #2::    pATIENT AWARE. Follow-up by: Rudy Jew, RN,  August 14, 2009 5:04 PM  New/Updated Medications: MICARDIS HCT 80-12.5 MG TABS (TELMISARTAN-HCTZ) one by mouth daily Prescriptions: MICARDIS HCT 80-12.5 MG TABS (TELMISARTAN-HCTZ) one by mouth daily  #30 x 11   Entered and Authorized by:   Stacie Glaze MD   Signed by:   Stacie Glaze MD on 08/14/2009   Method used:   [facility]  [address] [id number]       [address]       [place], [place]  [address]       Ph: [phone] or [phone]       Fax: [phone]   RxID:   [id number]

## 2010-04-01 NOTE — Progress Notes (Signed)
Summary: Pt is req cheaper alternative to Micardis  Phone Note Call from Patient Call back at Home Phone [phone]   Caller: Patient Reason for Call: Acute Illness Summary of Call: Pt is req a cheaper medicine than Micardis. [facility]s [facility] [address]  Initial call taken by: Lucy Antigua,  December 16, 2009 8:31 AM  Follow-up for Phone Call        pt informed she needs to call insurance company to find out what they will pay for Follow-up by: Willy Eddy, LPN,  December 16, 2009 8:35 AM

## 2010-04-01 NOTE — Progress Notes (Signed)
Summary: concerned about BP  Phone Note Call from Patient   Caller: Patient Call For: Stacie Glaze MD Reason for Call: Acute Illness Summary of Call: Pt felt dizzy x 2 mornings and had BP taken at [facility] yesterday.  It was 153/48.  Dizzy today but had not taken BP.  Is asking to come in office to have it checked. Taking Atenolol, Hctz and Losartan. [phone] - Home #     /    [phone] - Cell #  Initial call taken by: Lynann Beaver CMA,  May 06, 2009 10:25 AM  Follow-up for Phone Call        MUST HAVE OFFICE VISIT- GIVE  OV SOMETIME THIS WEEK Follow-up by: Willy Eddy, LPN,  May 06, 2009 10:31 AM  Additional Follow-up for Phone Call Additional follow up Details #1::        Phone Call [facility] with pt, appt was scheduled for March 8th, 2011 at 11:45am with Dr Lovell Sheehan... Pt adv to c/b, seek medical help if condition worsens / changes.  Additional Follow-up by: Debbra Riding,  May 06, 2009 10:47 AM

## 2010-04-01 NOTE — Letter (Signed)
Summary: [facility]  [facility]   Imported By: Maryln Gottron 05/15/2009 13:13:29  _____________________________________________________________________  External Attachment:    Type:   Image     Comment:   External Document

## 2010-04-01 NOTE — Assessment & Plan Note (Signed)
Summary: BP CONCERNS // RS   Vital Signs:  Patient profile:   77 year old female Height:      68 inches Weight:      186 pounds BMI:     28.38 Temp:     97.8 degrees F oral Pulse rate:   68 / minute Resp:     14 per minute BP sitting:   160 / 80  (left arm)  Vitals Entered By: Willy Eddy, LPN (May 07, 1608 12:09 PM) CC: c/o dizziness and wants bp check--PT IS TAKING HCTZ 12.5  PLUS HYZAAR 100-25   CC:  c/o dizziness and wants bp check--PT IS TAKING HCTZ 12.5  PLUS HYZAAR 100-25.  History of Present Illness: pt has increased blod pressure and wide puse pressure from changes mandanted by her desire to use generics and the confusion over recent drug changes she is light head some days and has noted wide pulse pressure with reading of 160/40 at home this are associated wothj the "dizzy" spells she has not noted rapid or iregular heart rates she has not noted edema she has no chest pain or nausea  Preventive Screening-Counseling & Management  Alcohol-Tobacco     Smoking Status: never  Problems Prior to Update: 1)  Hyperlipidemia, Atherogenic  (ICD-272.4) 2)  Dizziness, Chronic  (ICD-780.4) 3)  Allergic Rhinitis Due To Other Allergen  (ICD-477.8) 4)  Lipoma  (ICD-214.9) 5)  Neck Pain  (ICD-723.1) 6)  Leg Pain, Right  (ICD-729.5) 7)  Cellulitis and Abscess of Neck  (ICD-682.1) 8)  Sebaceous Cyst, Neck  (ICD-706.2) 9)  Chest Pain  (ICD-786.50) 10)  Preventive Health Care  (ICD-V70.0) 11)  Family History of Colon Ca 1st Degree Relative <60  (ICD-V16.0) 12)  Family History of Cad Female 1st Degree Relative <50  (ICD-V17.3) 13)  Meningioma  (ICD-225.2) 14)  Hypertension  (ICD-401.9) 15)  Diabetes Mellitus, Type II  (ICD-250.00) 16)  Fibrocystic Breast Disease  (ICD-610.1)  Current Problems (verified): 1)  Hyperlipidemia, Atherogenic  (ICD-272.4) 2)  Dizziness, Chronic  (ICD-780.4) 3)  Allergic Rhinitis Due To Other Allergen  (ICD-477.8) 4)  Lipoma  (ICD-214.9) 5)   Neck Pain  (ICD-723.1) 6)  Leg Pain, Right  (ICD-729.5) 7)  Cellulitis and Abscess of Neck  (ICD-682.1) 8)  Sebaceous Cyst, Neck  (ICD-706.2) 9)  Chest Pain  (ICD-786.50) 10)  Preventive Health Care  (ICD-V70.0) 11)  Family History of Colon Ca 1st Degree Relative <60  (ICD-V16.0) 12)  Family History of Cad Female 1st Degree Relative <50  (ICD-V17.3) 13)  Meningioma  (ICD-225.2) 14)  Hypertension  (ICD-401.9) 15)  Diabetes Mellitus, Type II  (ICD-250.00) 16)  Fibrocystic Breast Disease  (ICD-610.1)  Medications Prior to Update: 1)  Atenolol 100 Mg Tabs (Atenolol) .... Once Daily 2)  Hydrochlorothiazide 12.5 Mg  Caps (Hydrochlorothiazide) .... Once Daily 3)  Bl Lecithin 400 Mg  Caps (Lecithin) .... Once Daily 4)  Omnaris 50 Mcg/act Susp (Ciclesonide) .... Tw Spray Q Nare Daily ( Samples) 5)  Vitamin D 400 Unit Caps (Cholecalciferol) .Marland Kitchen.. 1 Once Daily 6)  Vitamin E 1000 Unit Caps (Vitamin E) .Marland Kitchen.. 1 Once Daily 7)  Lipoic Acid 150 Mg Caps (Alpha-Lipoic Acid) .Marland Kitchen.. 1 Once Daily 8)  Red Yeast Rice 600 Mg Tabs (Red Yeast Rice Extract) .Marland Kitchen.. 1 Once Daily 9)  Doxycycline Hyclate 100 Mg Caps (Doxycycline Hyclate) .Marland Kitchen.. 1 Two Times A Day For 10 Days 10)  Hyzaar 100-25 Mg Tabs (Losartan Potassium-Hctz) .... Take 1 Tablet By Mouth  Once A Day  Current Medications (verified): 1)  Atenolol 100 Mg Tabs (Atenolol) .... Once Daily 2)  Bl Lecithin 400 Mg  Caps (Lecithin) .... Once Daily 3)  Omnaris 50 Mcg/act Susp (Ciclesonide) .... Tw Spray Q Nare Daily ( Samples) 4)  Vitamin D 400 Unit Caps (Cholecalciferol) .Marland Kitchen.. 1 Once Daily 5)  Vitamin E 1000 Unit Caps (Vitamin E) .Marland Kitchen.. 1 Once Daily 6)  Lipoic Acid 150 Mg Caps (Alpha-Lipoic Acid) .Marland Kitchen.. 1 Once Daily 7)  Red Yeast Rice 600 Mg Tabs (Red Yeast Rice Extract) .Marland Kitchen.. 1 Once Daily 8)  Diovan Hct 320-12.5 Mg Tabs (Valsartan-Hydrochlorothiazide) .... One By Mouth Daily  Allergies (verified): 1)  Sulfa  Past History:  Family History: Last updated:  09/21/2006 Family History of CAD Female 1st degree relative  25 Family History of Colon CA 1st degree relative <60 mother Family History of Stroke F 1st degree relative <60  Social History: Last updated: 09/21/2006 Retired Married Never Smoked  Risk Factors: Smoking Status: never (05/07/2009)  Past medical, surgical, family and social histories (including risk factors) reviewed, and no changes noted (except as noted below).  Past Medical History: Reviewed history from 04/30/2008 and no changes required. Diabetes mellitus, type II Hypertension fibercystic breast dz meningioma tendon tear of right ankle  Past Surgical History: Reviewed history from 04/30/2008 and no changes required. Lumpectomy Tonsillectomy tendon tear right achilles  Family History: Reviewed history from 09/21/2006 and no changes required. Family History of CAD Female 1st degree relative  8 Family History of Colon CA 1st degree relative <60 mother Family History of Stroke F 1st degree relative <60  Social History: Reviewed history from 09/21/2006 and no changes required. Retired Married Never Smoked  Review of Systems  The patient denies anorexia, fever, weight loss, weight gain, vision loss, decreased hearing, hoarseness, chest pain, syncope, dyspnea on exertion, peripheral edema, prolonged cough, headaches, hemoptysis, abdominal pain, melena, hematochezia, severe indigestion/heartburn, hematuria, incontinence, genital sores, muscle weakness, suspicious skin lesions, transient blindness, difficulty walking, depression, unusual weight change, abnormal bleeding, enlarged lymph nodes, angioedema, and breast masses.    Physical Exam  General:  overweight but generally well appearing  Head:  normocephalic, atraumatic, and no abnormalities observed.  no sinus tenderness  Eyes:  pupils equal, pupils round, and nystagmus.   Ears:  R ear normal and L ear normal.   Neck:  no masses, nuchal rigidity, and  decreased ROM.   Lungs:  normal respiratory effort, no dullness, and no wheezes.   Heart:  normal rate and regular rhythm.   Abdomen:  Bowel sounds positive,abdomen soft and non-tender without masses, organomegaly or hernias noted. Msk:  no joint swelling, no joint warmth, and decreased ROM.   Neurologic:  No cranial nerve deficits noted. Station and gait are normal. Plantar reflexes are down-going bilaterally. DTRs are symmetrical throughout. Sensory, motor and coordinative functions appear intact. Skin:  Intact without suspicious lesions or rashes   Impression & Recommendations:  Problem # 1:  DIZZINESS, CHRONIC (ICD-780.4) Assessment Deteriorated related to the overuse of hctz and wide pulse pressure? Demonstrated maneuvers to self-treat vertigo. Patient to call to be seen if no improvement in 10-14 days, sooner if worse.   Problem # 2:  HYPERTENSION (ICD-401.9) Assessment: Deteriorated lond discssion of medications and careful written instrcton on new meds folllow up in 2 weeks for BP reading and 2 months for OV The following medications were removed from the medication list:    Hydrochlorothiazide 12.5 Mg Caps (Hydrochlorothiazide) ..... Once daily  Hyzaar 100-25 Mg Tabs (Losartan potassium-hctz) .Marland Kitchen... Take 1 tablet by mouth once a day Her updated medication list for this problem includes:    Atenolol 100 Mg Tabs (Atenolol) ..... Once daily    Diovan Hct 320-12.5 Mg Tabs (Valsartan-hydrochlorothiazide) ..... One by mouth daily  BP today: 160/80 Prior BP: 144/80 (09/14/2008)  Prior 10 Yr Risk Heart Disease: Not enough information (02/08/2007)  Labs Reviewed: K+: 3.4 (04/30/2008) Creat: : 0.7 (09/14/2008)   Chol: 253 (09/14/2008)   HDL: 36.60 (09/14/2008)   LDL: DEL (04/30/2008)   TG: 287.0 (09/14/2008)  Complete Medication List: 1)  Atenolol 100 Mg Tabs (Atenolol) .... Once daily 2)  Bl Lecithin 400 Mg Caps (Lecithin) .... Once daily 3)  Omnaris 50 Mcg/act Susp  (Ciclesonide) .... Tw spray q nare daily ( samples) 4)  Vitamin D 400 Unit Caps (Cholecalciferol) .Marland Kitchen.. 1 once daily 5)  Vitamin E 1000 Unit Caps (Vitamin e) .Marland Kitchen.. 1 once daily 6)  Lipoic Acid 150 Mg Caps (Alpha-lipoic acid) .Marland Kitchen.. 1 once daily 7)  Red Yeast Rice 600 Mg Tabs (Red yeast rice extract) .Marland Kitchen.. 1 once daily 8)  Diovan Hct 320-12.5 Mg Tabs (Valsartan-hydrochlorothiazide) .... One by mouth daily  Patient Instructions: 1)  Please schedule a follow-up appointment in 2 months.

## 2010-04-17 ENCOUNTER — Telehealth: Payer: Self-pay | Admitting: *Deleted

## 2010-04-17 NOTE — Telephone Encounter (Signed)
Pt wants to change Losartan back to Micardis.  She thinks the Losartan is causing her to be depressed. [facility] [address])

## 2010-04-18 NOTE — Telephone Encounter (Signed)
May resume the micardis but it may not be covered by insurance

## 2010-04-18 NOTE — Telephone Encounter (Signed)
Pt informed-left message on machine  

## 2010-05-08 ENCOUNTER — Ambulatory Visit: Payer: Self-pay | Admitting: Internal Medicine

## 2010-05-09 ENCOUNTER — Ambulatory Visit (INDEPENDENT_AMBULATORY_CARE_PROVIDER_SITE_OTHER): Payer: Medicare HMO | Admitting: Internal Medicine

## 2010-05-09 ENCOUNTER — Encounter: Payer: Self-pay | Admitting: Internal Medicine

## 2010-05-09 DIAGNOSIS — R7309 Other abnormal glucose: Secondary | ICD-10-CM

## 2010-05-09 DIAGNOSIS — E785 Hyperlipidemia, unspecified: Secondary | ICD-10-CM

## 2010-05-09 DIAGNOSIS — I10 Essential (primary) hypertension: Secondary | ICD-10-CM

## 2010-05-09 LAB — BASIC METABOLIC PANEL
BUN: 8 mg/dL (ref 6–23)
Calcium: 9.4 mg/dL (ref 8.4–10.5)
Potassium: 4.1 mEq/L (ref 3.5–5.3)

## 2010-05-09 MED ORDER — TELMISARTAN-HCTZ 80-12.5 MG PO TABS: 1.0000 | ORAL_TABLET | Freq: Every day | ORAL | Status: DC

## 2010-05-23 ENCOUNTER — Telehealth: Payer: Self-pay | Admitting: *Deleted

## 2010-05-23 NOTE — Telephone Encounter (Signed)
[name] 

## 2010-05-23 NOTE — Telephone Encounter (Signed)
Would like lab work, please.

## 2010-05-23 NOTE — Telephone Encounter (Signed)
Left message on machine Ok except for slightly elevated blood sugar

## 2010-07-15 NOTE — Assessment & Plan Note (Signed)
[facility]                                 ON-CALL NOTE   KRISNA, OMAR                     MRN:          [id number]  DATE:08/05/2007                            DOB:          August 13, 1931    TIME:  8:12 p.m.   PHONE NUMBER:  [phone].   Dr. Lovell Sheehan is the regular doctor.   Patient says she has had swelling in her leg and ankle for two weeks.  It is starting to go up further into her leg.  She denies any redness or  heat.  She said it is somewhat sore.   She went and saw Dr. Amador Cunas this week.  She said she had an  ultrasound done on her leg, and there was no blood clot.  She went to  urgent care today on [employer].  She said the doctor told her she  had a good pulse in the foot, but they did not know what was wrong.   She is calling now because she is very concerned.  She said she has a  family member in the past who knew someone with a blood clot that was  not detected by ultrasound, and she wants to know if there is any chance  she should still have a blood clot.  I advised her that without being  able to see her and examine her at this moment, I cannot tell her  whether that is the case or not.   She said she did not feel comfortable waiting until tomorrow morning to  be seen at the Saturday clinic, and I advised her to go to the emergency  room for evaluation now, and that is what she is going to do.  Of note,  she did deny any chest pain, shortness of breath, fever, or other  symptoms.     Marne A. Tower, MD  Electronically Signed    MAT/MedQ  DD: 08/05/2007  DT: 08/05/2007  Job #: [id number]

## 2010-08-07 ENCOUNTER — Ambulatory Visit (INDEPENDENT_AMBULATORY_CARE_PROVIDER_SITE_OTHER): Payer: Medicare HMO | Admitting: Internal Medicine

## 2010-08-07 VITALS — BP 140/80 | HR 72 | Temp 98.2°F | Resp 16 | Ht 63.0 in | Wt 186.0 lb

## 2010-08-07 DIAGNOSIS — I10 Essential (primary) hypertension: Secondary | ICD-10-CM

## 2010-08-07 DIAGNOSIS — Z0000 Encounter for general adult medical examination without abnormal findings: Secondary | ICD-10-CM

## 2010-08-07 LAB — BASIC METABOLIC PANEL
Calcium: 8.9 mg/dL (ref 8.4–10.5)
GFR: 77.35 mL/min (ref >60.00)
Glucose, Bld: 100 mg/dL — ABNORMAL HIGH (ref 70–99)
Potassium: 3.9 mEq/L (ref 3.5–5.1)
Sodium: 140 mEq/L (ref 135–145)

## 2010-08-07 LAB — POCT URINALYSIS DIPSTICK
Bilirubin, UA: NEGATIVE
Ketones, UA: NEGATIVE
Leukocytes, UA: NEGATIVE
Nitrite, UA: NEGATIVE
Protein, UA: NEGATIVE
pH, UA: 6.5

## 2010-08-07 LAB — LIPID PANEL
HDL: 44.7 mg/dL (ref >39.00)
Total CHOL/HDL Ratio: 6
VLDL: 50.6 mg/dL — ABNORMAL HIGH (ref 0.0–40.0)

## 2010-08-07 LAB — CBC WITH DIFFERENTIAL/PLATELET
Basophils Relative: 0.4 % (ref 0.0–3.0)
Eosinophils Absolute: 0.1 10*3/uL (ref 0.0–0.7)
Hemoglobin: 13.7 g/dL (ref 12.0–15.0)
Lymphocytes Relative: 16 % (ref 12.0–46.0)
Monocytes Relative: 7.2 % (ref 3.0–12.0)
Neutro Abs: 4.6 10*3/uL (ref 1.4–7.7)
Neutrophils Relative %: 74.2 % (ref 43.0–77.0)
RBC: 4.71 Mil/uL (ref 3.87–5.11)
WBC: 6.2 10*3/uL (ref 4.5–10.5)

## 2010-08-07 LAB — HEPATIC FUNCTION PANEL
AST: 20 U/L (ref 0–37)
Albumin: 4.3 g/dL (ref 3.5–5.2)
Alkaline Phosphatase: 80 U/L (ref 39–117)
Bilirubin, Direct: 0.2 mg/dL (ref 0.0–0.3)
Total Bilirubin: 1 mg/dL (ref 0.3–1.2)

## 2010-08-07 MED ORDER — TELMISARTAN-HCTZ 80-12.5 MG PO TABS: 1.0000 | ORAL_TABLET | Freq: Every day | ORAL | Status: DC

## 2010-08-08 LAB — LDL CHOLESTEROL, DIRECT: Direct LDL: 185.8 mg/dL

## 2010-08-08 LAB — VITAMIN D 25 HYDROXY (VIT D DEFICIENCY, FRACTURES): Vit D, 25-Hydroxy: 50 ng/mL (ref 30–89)

## 2010-08-28 ENCOUNTER — Other Ambulatory Visit: Payer: Self-pay | Admitting: Internal Medicine

## 2010-09-09 ENCOUNTER — Encounter: Payer: Self-pay | Admitting: *Deleted

## 2010-09-12 ENCOUNTER — Encounter (HOSPITAL_BASED_OUTPATIENT_CLINIC_OR_DEPARTMENT_OTHER): Payer: Self-pay | Admitting: *Deleted

## 2010-09-12 ENCOUNTER — Emergency Department (INDEPENDENT_AMBULATORY_CARE_PROVIDER_SITE_OTHER): Payer: Medicare HMO

## 2010-09-12 ENCOUNTER — Other Ambulatory Visit: Payer: Self-pay

## 2010-09-12 ENCOUNTER — Telehealth: Payer: Self-pay | Admitting: Internal Medicine

## 2010-09-12 ENCOUNTER — Inpatient Hospital Stay (HOSPITAL_COMMUNITY)
Admission: AD | Admit: 2010-09-12 | Discharge: 2010-09-15 | DRG: 149 | Disposition: A | Discharge status: Home / Self Care | Payer: Medicare HMO | Source: Other Acute Inpatient Hospital | Attending: Family Medicine | Admitting: Family Medicine

## 2010-09-12 ENCOUNTER — Inpatient Hospital Stay (HOSPITAL_COMMUNITY): Payer: Medicare HMO

## 2010-09-12 ENCOUNTER — Emergency Department (HOSPITAL_BASED_OUTPATIENT_CLINIC_OR_DEPARTMENT_OTHER)
Admission: EM | Admit: 2010-09-12 | Discharge: 2010-09-12 | Disposition: A | Discharge status: Other Acute Inpatient Hospital | Payer: Medicare HMO | Source: Home / Self Care | Attending: Emergency Medicine | Admitting: Emergency Medicine

## 2010-09-12 DIAGNOSIS — E785 Hyperlipidemia, unspecified: Secondary | ICD-10-CM | POA: Diagnosis present

## 2010-09-12 DIAGNOSIS — R220 Localized swelling, mass and lump, head: Secondary | ICD-10-CM

## 2010-09-12 DIAGNOSIS — R42 Dizziness and giddiness: Secondary | ICD-10-CM

## 2010-09-12 DIAGNOSIS — R011 Cardiac murmur, unspecified: Secondary | ICD-10-CM | POA: Diagnosis present

## 2010-09-12 DIAGNOSIS — R2681 Unsteadiness on feet: Secondary | ICD-10-CM

## 2010-09-12 DIAGNOSIS — G459 Transient cerebral ischemic attack, unspecified: Secondary | ICD-10-CM

## 2010-09-12 DIAGNOSIS — T50995A Adverse effect of other drugs, medicaments and biological substances, initial encounter: Secondary | ICD-10-CM | POA: Diagnosis present

## 2010-09-12 DIAGNOSIS — I10 Essential (primary) hypertension: Secondary | ICD-10-CM

## 2010-09-12 DIAGNOSIS — R51 Headache: Secondary | ICD-10-CM

## 2010-09-12 DIAGNOSIS — I2510 Atherosclerotic heart disease of native coronary artery without angina pectoris: Secondary | ICD-10-CM | POA: Diagnosis present

## 2010-09-12 DIAGNOSIS — I951 Orthostatic hypotension: Secondary | ICD-10-CM | POA: Diagnosis present

## 2010-09-12 DIAGNOSIS — G319 Degenerative disease of nervous system, unspecified: Secondary | ICD-10-CM

## 2010-09-12 LAB — URINALYSIS, ROUTINE W REFLEX MICROSCOPIC
Bilirubin Urine: NEGATIVE
Ketones, ur: NEGATIVE mg/dL
Nitrite: NEGATIVE
Urobilinogen, UA: 0.2 mg/dL (ref 0.0–1.0)

## 2010-09-12 LAB — TSH: TSH: 1.355 u[IU]/mL (ref 0.350–4.500)

## 2010-09-12 LAB — CARDIAC PANEL(CRET KIN+CKTOT+MB+TROPI)
CK, MB: 1.5 ng/mL (ref 0.3–4.0)
Relative Index: INVALID (ref 0.0–2.5)
Total CK: 42 U/L (ref 7–177)
Troponin I: <0.3 ng/mL (ref <0.30)

## 2010-09-12 LAB — COMPREHENSIVE METABOLIC PANEL
AST: 17 U/L (ref 0–37)
CO2: 31 mEq/L (ref 19–32)
Calcium: 9.9 mg/dL (ref 8.4–10.5)
Creatinine, Ser: 0.7 mg/dL (ref 0.50–1.10)
GFR calc Af Amer: >60 mL/min (ref >60)
GFR calc non Af Amer: >60 mL/min (ref >60)
Glucose, Bld: 99 mg/dL (ref 70–99)
Total Protein: 7 g/dL (ref 6.0–8.3)

## 2010-09-12 LAB — CBC
MCH: 27.9 pg (ref 26.0–34.0)
MCHC: 34.7 g/dL (ref 30.0–36.0)
MCV: 80.4 fL (ref 78.0–100.0)
Platelets: 195 10*3/uL (ref 150–400)
RBC: 4.99 MIL/uL (ref 3.87–5.11)

## 2010-09-12 LAB — PROTIME-INR: INR: 0.95 (ref 0.00–1.49)

## 2010-09-12 LAB — APTT: aPTT: 30 seconds (ref 24–37)

## 2010-09-12 MED ORDER — ASPIRIN 81 MG PO CHEW
324.0000 mg | CHEWABLE_TABLET | Freq: Once | ORAL | Status: AC
  Administered 2010-09-12: 324 mg via ORAL
  Filled 2010-09-12: qty 4

## 2010-09-12 MED ORDER — SODIUM CHLORIDE 0.9 % IV SOLN
Freq: Once | INTRAVENOUS | Status: AC
  Administered 2010-09-12: 16:00:00 via INTRAVENOUS

## 2010-09-12 NOTE — Telephone Encounter (Signed)
Pt called 7/13 requesting an appt with Dr. Lovell Sheehan. Stated she has called EMS because her BP was high. EMS came and checked BP, which pt stated was 200/100. She refused to go with EMS. They wanted her to follow up with Dr. Lovell Sheehan. Dr. Lovell Sheehan advised me to tell pt to go to the hospital with EMS to be checked out. When I told this to the pt, she informed me she had sent the EMS away 1 hour before she called. Pt then stated she had not taken her BP meds prior to calling EMS, but she had since, and could feel her BP was down. I repeatedly offered her an appt to follow up with Dr. Lovell Sheehan on Monday. Pt did not make an appt and verbalized no plan to me of going to the hospital.

## 2010-09-12 NOTE — ED Notes (Signed)
Labs recollected for clotted samples.   Waste drawn from [employer] and sample obtained for recollects.

## 2010-09-12 NOTE — ED Notes (Signed)
Pt took home BP med Atenolol 100mg  @ 1550, Dr. Karma Ganja aware and authorized.

## 2010-09-12 NOTE — ED Provider Notes (Signed)
History     Chief Complaint  Patient presents with  . Dizziness   Patient is a 77 y.o. female presenting with neurologic complaint. The history is provided by the patient.  Neurologic Problem The primary symptoms include dizziness. Primary symptoms do not include syncope, focal weakness, loss of sensation, speech change or memory loss. The symptoms began 6 to 12 hours ago. Episode duration: several. The symptoms are improving. The neurological symptoms are diffuse. The symptoms occurred while sleeping and after standing up.  Dizziness does not occur with weakness.  Additional symptoms include loss of balance and vertigo. Additional symptoms do not include weakness. Medical issues also include hypertension. Medical issues do not include seizures or cerebral vascular accident. Procedure history comments: none.    Past Medical History  Diagnosis Date  . Diabetes mellitus   . Hypertension   . Fibrocystic breast disease   . Meningioma   . Tendon tear, ankle     Past Surgical History  Procedure Date  . Breast lumpectomy   . Tonsillectomy   . Tendon tear   . Colonoscopy     05/25/2000. Results: Normal. Hemorrhoids    Family History  Problem Relation Age of Onset  . Cancer Mother   . Heart disease Father   . Stroke Father     History  Substance Use Topics  . Smoking status: Never Smoker   . Smokeless tobacco: Not on file  . Alcohol Use: No    OB History    Grav Para Term Preterm Abortions TAB SAB Ect Mult Living                  Review of Systems  Cardiovascular: Negative for syncope.  Neurological: Positive for dizziness, vertigo and loss of balance. Negative for speech change, focal weakness and weakness.  Psychiatric/Behavioral: Negative for memory loss.  10 Systems reviewed and are negative for acute change except as noted in the HPI.   Physical Exam  BP 156/63  Pulse 66  Temp(Src) 98.1 F (36.7 C) (Oral)  Resp 16  Ht 5\' 4"  (1.626 m)  Wt 180 lb (81.647  kg)  BMI 30.90 kg/m2  SpO2 96%  Physical Exam  Vitals reviewed. BP 155/67  Pulse 69  Temp(Src) 98.1 F (36.7 C) (Oral)  Resp 16  Ht 5\' 4"  (1.626 m)  Wt 180 lb (81.647 kg)  BMI 30.90 kg/m2  SpO2 97% General appearance: alert, cooperative and appears stated age Head: Normocephalic, without obvious abnormality, atraumatic Eyes: negative findings: conjunctivae and sclerae normal, pupils equal, round, reactive to light and accomodation, visual fields full to confrontation and EOMI, no nystagmus Neck: no carotid bruit, no JVD and supple, symmetrical, trachea midline Lungs: clear to auscultation bilaterally Heart: regular rate and rhythm, S1, S2 normal, no murmur, click, rub or gallop Abdomen: soft, non-tender; bowel sounds normal; no masses,  no organomegaly Extremities: extremities normal, atraumatic, no cyanosis or edema Pulses: 2+ and symmetric Skin: Skin color, texture, turgor normal. No rashes or lesions Neurologic: Alert and oriented X 3, normal strength and tone. Normal symmetric reflexes. Normal coordination and gait Cranial nerves: normal Sensory: normal Motor: grossly normal Coordination: finger to nose normal bilaterally Gait: Normal Heel to shin testing normal bilaterally  ED Course  Procedures  Date: 09/12/2010  Rate: 61  Rhythm: normal sinus rhythm  QRS Axis: normal  Intervals: normal  ST/T Wave abnormalities: nonspecific T wave changes  Conduction Disutrbances:none  Narrative Interpretation:   Old EKG Reviewed: 02/05/96, no significant changes except flattened/biphasic  t waves in anterior leads   MDM Pt with onset of symptoms of unsteadiness of gait and feelings of loss of balance, denies spinning.  Symptoms were present upon awakening from sleep and are improving.  Neuro exam is normal.  EKG, labs unremarkable.  Head CT shows meningioma- stable in size and appearance from MRI 2010 (images reviewed by me).  Pt given ASA.  D/w [facility] hospitalist for admission at  [facility] for TIA workup.

## 2010-09-12 NOTE — Telephone Encounter (Signed)
Dr Lovell Sheehan aware

## 2010-09-12 NOTE — ED Notes (Signed)
Woke up this morning and felt "woozy"

## 2010-09-12 NOTE — ED Notes (Signed)
IV intact and NS 142mL/hr continues to infuse.

## 2010-09-12 NOTE — ED Notes (Signed)
Via  [facility]--spoke with crystal

## 2010-09-13 DIAGNOSIS — Z79899 Other long term (current) drug therapy: Secondary | ICD-10-CM

## 2010-09-13 DIAGNOSIS — R42 Dizziness and giddiness: Secondary | ICD-10-CM

## 2010-09-13 LAB — LIPID PANEL
Cholesterol: 233 mg/dL — ABNORMAL HIGH (ref 0–200)
HDL: 37 mg/dL — ABNORMAL LOW (ref >39)
LDL Cholesterol: 156 mg/dL — ABNORMAL HIGH (ref 0–99)
Total CHOL/HDL Ratio: 6.3 RATIO
Triglycerides: 198 mg/dL — ABNORMAL HIGH (ref <150)
VLDL: 40 mg/dL (ref 0–40)

## 2010-09-13 LAB — CARDIAC PANEL(CRET KIN+CKTOT+MB+TROPI)
CK, MB: 1.3 ng/mL (ref 0.3–4.0)
CK, MB: 1.2 ng/mL (ref 0.3–4.0)
Relative Index: INVALID (ref 0.0–2.5)
Relative Index: INVALID (ref 0.0–2.5)
Total CK: 38 U/L (ref 7–177)
Total CK: 38 U/L (ref 7–177)
Troponin I: <0.3 ng/mL (ref <0.30)
Troponin I: <0.3 ng/mL (ref <0.30)

## 2010-09-13 LAB — BASIC METABOLIC PANEL
BUN: 10 mg/dL (ref 6–23)
CO2: 29 mEq/L (ref 19–32)
Calcium: 9.3 mg/dL (ref 8.4–10.5)
Chloride: 103 mEq/L (ref 96–112)
Creatinine, Ser: 0.68 mg/dL (ref 0.50–1.10)
GFR calc Af Amer: >60 mL/min (ref >60)
GFR calc non Af Amer: >60 mL/min (ref >60)
Glucose, Bld: 86 mg/dL (ref 70–99)
Potassium: 3.2 mEq/L — ABNORMAL LOW (ref 3.5–5.1)
Sodium: 139 mEq/L (ref 135–145)

## 2010-09-13 LAB — HEMOGLOBIN A1C
Hgb A1c MFr Bld: 6 % — ABNORMAL HIGH (ref <5.7)
Mean Plasma Glucose: 126 mg/dL — ABNORMAL HIGH (ref <117)

## 2010-09-14 LAB — BASIC METABOLIC PANEL
Calcium: 9.4 mg/dL (ref 8.4–10.5)
GFR calc non Af Amer: >60 mL/min (ref >60)
Glucose, Bld: 90 mg/dL (ref 70–99)
Sodium: 140 mEq/L (ref 135–145)

## 2010-09-14 NOTE — H&P (Signed)
[name], [name] NO.:  [id number]  MEDICAL RECORD NO.:  [id number]  LOCATION:  1441                         FACILITY:  [facility]  PHYSICIAN:  Celso Amy, MD   DATE OF BIRTH:  [birth year]  DATE OF ADMISSION:  09/12/2010 DATE OF DISCHARGE:                             HISTORY & PHYSICAL   PRIMARY CARE PHYSICIAN:  Stacie Glaze, MD  CHIEF COMPLAINT:  Unsteadiness.  HISTORY OF PRESENT ILLNESS:  The patient is a 77 year old white female with past medical history of hypertension who presented to the ER with a chief complaint of unsteadiness.  History of present illness dates back to this a.m. when the patient woke up and she was feeling her right toe was normal.  Right toe numbness later improved.  The patient later started feeling dizzy and that is the reason she came to the ER.  The patient says that she feels increased dizziness on postural change and also during ambulation.  No complaint of chest pain or shortness of breath.  No complaint of syncope or seizures.  No complaint of change in vision.  No complaint of double vision.  No complaint of any focal weakness.  No complaint of fever, cough.  No complaint of trauma or fall.  REVIEW OF SYSTEMS:  Positive for chills.  ALLERGIES:  The patient has no known drug allergies.  SOCIAL HISTORY:  Nonsmoker, nondrinker, lives alone.  FAMILY HISTORY:  Father died at the age of 39 from heart attack.  Mother died at age of 81 from stroke.  PAST MEDICAL HISTORY:  Positive for hypertension.  REVIEW OF SYSTEMS:  Positive only for chills.  MEDICATIONS:  As outpatient, the patient takes losartan 50 mg p.o. daily, hydrochlorothiazide 12.5 mg p.o. daily, atenolol 100 mg p.o. daily.  PHYSICAL EXAMINATION:  VITAL SIGNS:  Blood pressure is 155/70, pulse is 63, respiratory rate 16, temperature afebrile. GENERAL:  On general examination, the patient is awake, alert, oriented to time, place and person; is well  built, well nourished, well groomed, follows commands. RESPIRATORY:  No acute respite distress. CHEST:  Clear to auscultation.  No rhonchi or rales were heard. HEENT:  Pupils equally reactive to light and accommodation.  Extraocular movements intact.  Head is atraumatic, normocephalic.  No carotid bruits were heard. CARDIOVASCULAR:  S1, S2.  Regular rate and rhythm.  No murmurs were heard. GI:  Bowel sounds are present.  Abdomen is soft, nontender, nondistended.  No organomegaly was detected. EXTREMITIES:  No lower extremity edema.  No cyanosis was seen. CNS: Cranial nerves II through XII are grossly intact.  The patient's strength is equal in both upper and lower extremities.  The patient had no pronator drift.  The patient's finger-to-nose test was normal. PSYCH:  The patient has normal mood and affect.  LABORATORY DATA:  The patient's sodium is 140, potassium is 3.9.  The patient's serum chloride is 101, bicarb is 31, BUN 9, serum creatinine 0.7, glucose 99.  Hemoglobin 13.9.  Platelets 195.  AST 17, ALT 18, calcium 9.9, albumin 4.0.  CT head done showed no acute intracranial abnormality.  It does have 1.8 x 1.2 cm calcified right parietal meningioma-which has been there in  the past as well.  IMPRESSION: 1. CNS: The patient is being admitted with dizziness/unsteadiness.     This could be transient ischemic attack.  The patient does not take     aspirin on daily basis. 2. Hypertension.  Hypertension was noted not at goal early but now the     blood pressure is better. 3. DVT.  We will keep the patient on DVT prophylaxis. 4. Social.  The patient is full code.  PLAN: 1. We will start the patient on aspirin. 2. We will get MRI of brain. 3. We will get carotid Dopplers. 4. We will get 2-D echo. 5. We will ask for OT, PT consult. 6. The patient's further clinical course depends whether she follows     this plan.     Celso Amy, MD     MB/MEDQ  D:  09/12/2010  T:   09/12/2010  Job:  [id number]  Electronically Signed by Celso Amy M.D. on 09/14/2010 12:15:43 PM

## 2010-09-15 DIAGNOSIS — R42 Dizziness and giddiness: Secondary | ICD-10-CM

## 2010-09-16 ENCOUNTER — Ambulatory Visit (INDEPENDENT_AMBULATORY_CARE_PROVIDER_SITE_OTHER): Payer: Medicare HMO | Admitting: Internal Medicine

## 2010-09-16 VITALS — BP 180/80 | HR 80 | Temp 98.2°F | Resp 16 | Ht 63.0 in | Wt 186.0 lb

## 2010-09-16 DIAGNOSIS — I10 Essential (primary) hypertension: Secondary | ICD-10-CM

## 2010-09-16 DIAGNOSIS — Z9119 Patient's noncompliance with other medical treatment and regimen: Secondary | ICD-10-CM

## 2010-09-16 DIAGNOSIS — Z91199 Patient's noncompliance with other medical treatment and regimen due to unspecified reason: Secondary | ICD-10-CM

## 2010-09-16 MED ORDER — AMLODIPINE-OLMESARTAN 10-40 MG PO TABS: 1.0000 | ORAL_TABLET | Freq: Every day | ORAL | Status: DC

## 2010-09-16 NOTE — Patient Instructions (Addendum)
Referred allergies to stop both the hydrochlorothiazide and amlodipine that she got at discharge from the hospital and I do not want to take any other medications that he might have at home. In fact I want you to bring all medications to her next visit.  For now you going to take a Azor   10/40 one a day  At night time beginning tonight

## 2010-09-19 NOTE — Discharge Summary (Signed)
[name], Rodgers NO.:  [id number]  MEDICAL RECORD NO.:  [id number]  LOCATION:  1441                         FACILITY:  [facility]  PHYSICIAN:  Pleas Koch, MD        DATE OF BIRTH:  [birth year]  DATE OF ADMISSION:  09/12/2010 DATE OF DISCHARGE:  09/15/2010                              DISCHARGE SUMMARY   DISCHARGE DIAGNOSES: 1. Dizziness. 2. Hypertension. 3. History of coronary artery disease. 4. Hyperlipidemia. 5. A1c of 6.0. 6. Holosystolic murmur, second upper intercostal space. 7. Hyperlipidemia, total cholesterol 233, LDL 156.  DISCHARGE MEDICATIONS: 1. I have discontinued her losartan/HCTZ. 2. I have added meclizine 25 q.8 hourly. 3. Discontinued her atenolol 100 daily. 4. Added amlodipine 10 mg daily. 5. Added aspirin 325 1 tablet daily. 6. Added promethazine 6.25 mg q.8 p.r.n. 7. Added hydrochlorothiazide 12.5 daily.  PERTINENT IMAGING STUDIES: 1. CT head on September 12, 2010, showed no acute intracranial     abnormalities, a 1.8 x 1.2 cm calcified high right parietal mass     compatible with meningioma.  MR brain showed mild age-related     atrophy and chronic small vessel disease of hemispheric white     matter.  No evidence of acute reversible process.  Chronically     known 18-mm calcified meningioma along the right posterior frontal     convexity without significant mass effect. 2. Carotid duplex ultrasound showed no stenosis. 3. Echocardiogram done on September 13, 2010, showed EF 60% to 65%, no wall     motion abnormalities, diastolic function parameters were normal, no     patent foramen or defect was noted.  HISTORY:  This is briefly a pleasant 77 year old female with history of hypertension who presented to the ED today with history of dizziness. This relates to this morning when she woke up, feeling her right toe was numb.  She starting feeling dizziness and she came to the emergency room.  She says the dizziness was more postural change,  not during ambulation.  Had no chest pain, no shortness breath, no syncope, no seizures.  No change of vision, no double vision, no focal weakness, no fever, no cough.  Complained of trauma.  No fall.  PHYSICAL EXAMINATION:  VITAL SIGNS:  Initially showed blood pressure 125/70, pulse 63, respiratory rate 16; temperature, she was afebrile. CHEST:  Clinically clear. HEART:  S1, S2.  No rubs or gallops.  I heard a grade 2 murmur left to the right upper sternal edge. NEUROLOGIC:  Cranial nerves II through XII were grossly intact. Strength equal in upper and lower extremities.  Finger-nose-finger was normal.  No pronator drift. PSYCHIATRIC:  Normal mood, affect.  PERTINENT LABORATORIES:  Initially BUN/creatinine 9/0.7.  Platelets 195, hemoglobin 13.9.  HOSPITAL COURSE ACCORDING TO ISSUES: 1. Dizziness.  The patient had an extensive workup including the     above.  I have also asked PT/OT to see her for vestibular therapy.     They did note some element of the fact that initially; however, she     states that she got dizzy mainly after medication being given,     specifically her blood pressure medications.  As such, I  have     discontinued the majority of her antihypertensives including her     atenolol and her losartan.  I am sending her home on very low dose     of HCTZ in addition to Norvasc, and hopefully she will not have a     recurrence of the same.  I think the cause of her dizziness is     still cryptogenic, it may be multifactorial with BPV playing a part     in addition to some orthostatic hypotension even though her     orthostatics were relatively normal. 2. Hypertension.  I have replaced as noted, on various medications     including Norvasc and HCTZ.  She is to follow up with primary care     physician in the future. 3. Possible history of coronary artery disease.  In view of this, I     have started her on aspirin. 4. Hyperlipidemia.  She had a lipid panel done in the  hospital, it is     not our goal.  As such, as an outpatient she will need dietary     counseling, will also need to continue her statin if she is on one.     Recommended her discussing with her primary care physician, and     recommend Crestor. 5. A1c of 6.0.  She was checked for diabetes and found to be possibly     prediabetic, again dietary counseling would be appropriate at her     age.  On day of discharge, the patient was doing well.  She was tolerating diet.  She had a low-grade temperature of 99, pulse 83, respirations 18, blood pressure 136 to 150 over 90 to 83, and sats of 100% on room air. She was alert, oriented.  Chest clinically clear.  S1, S2.  No rubs or gallops.  Abdomen was soft, nontender, nondistended.  Extremities were soft.  She was reviewed by PT/OT on day of discharge and did not really have any symptoms consistent with benign positional vertigo, so she may just be able to go home without any of those precautions.  She is counseled not to drive without assistance for the next week; hopefully, she will be able to resume full activities as an outpatient.  It was a pleasure taking care of this patient.  I spent over 40 minutes of time coordinating her discharge discussion and care.          ______________________________ Pleas Koch, MD     JS/MEDQ  D:  09/15/2010  T:  09/15/2010  Job:  [id number]  Electronically Signed by Pleas Koch MD on 09/19/2010 09:14:21 PM

## 2010-09-23 ENCOUNTER — Telehealth: Payer: Self-pay | Admitting: *Deleted

## 2010-09-23 NOTE — Telephone Encounter (Signed)
Pt make take 1/2 atenolol daily,monitor bp and try calling during our office hours, not call a nurse

## 2010-09-23 NOTE — Telephone Encounter (Signed)
[facility] Triage Call Report Triage Record Num: [id number] Operator: Albertine Grates Patient Name: [name] Call Date & Time: 09/22/2010 7:44:27PM Patient Phone: [phone] PCP: Darryll Capers Patient Gender: Female PCP Fax : [phone] Patient DOB: [birth year] Practice Name: [facility] Reason for Call: BP has been 182/101 7-23. Took Azor 10mg /40mg  at 1700. Denies emergent symptoms. Was just started on this med 7-17 after being hospitalized with increased blood pressure. Has Atenolol 100mg  tabs and wants to know if can take. HR is 95. Dr. Drue Novel notified and advised can take Atenolol 100mg  1/2 tab now. Is to follow up with MD 7-24. Call back if needed. Protocol(s) Used: Hypertension, Diagnosed or Suspected Recommended Outcome per Protocol: See Provider within 4 hours Reason for Outcome: Systolic blood pressure of more than 180 mmHg OR diastolic blood pressure of more than 120 mmHg Care Advice: ~ 07

## 2010-09-23 NOTE — Telephone Encounter (Signed)
LMTCB

## 2010-09-25 NOTE — Telephone Encounter (Signed)
LMTCB for the 3rd time.  Left instructions on voicemail and to call back if needed

## 2010-09-29 ENCOUNTER — Telehealth: Payer: Self-pay | Admitting: *Deleted

## 2010-09-29 NOTE — Telephone Encounter (Signed)
Dr jenkins is aware 

## 2010-09-29 NOTE — Telephone Encounter (Signed)
BP readings are as follows: Sat 139-78 am  115/66 pm Sun 133/79 am  118/74 pm Mon 144/86 am  103/92 pm  Told pt to keep med the same for now.  She has an appt with Dr Lovell Sheehan on Wednesday. She will continue to monitor BP and bring in readings to appt

## 2010-10-01 ENCOUNTER — Encounter: Payer: Self-pay | Admitting: Internal Medicine

## 2010-10-01 ENCOUNTER — Ambulatory Visit (INDEPENDENT_AMBULATORY_CARE_PROVIDER_SITE_OTHER): Payer: Medicare HMO | Admitting: Internal Medicine

## 2010-10-01 VITALS — BP 130/78 | HR 72 | Temp 98.2°F | Resp 14 | Ht 63.0 in | Wt 186.0 lb

## 2010-10-01 DIAGNOSIS — T887XXA Unspecified adverse effect of drug or medicament, initial encounter: Secondary | ICD-10-CM

## 2010-10-01 DIAGNOSIS — I10 Essential (primary) hypertension: Secondary | ICD-10-CM

## 2010-10-01 DIAGNOSIS — R42 Dizziness and giddiness: Secondary | ICD-10-CM

## 2010-10-01 MED ORDER — ATENOLOL 100 MG PO TABS: 50.0000 mg | ORAL_TABLET | Freq: Every day | ORAL | Status: DC

## 2010-10-01 MED ORDER — OLMESARTAN-AMLODIPINE-HCTZ 40-5-12.5 MG PO TABS: 1.0000 | ORAL_TABLET | Freq: Every day | ORAL | Status: DC

## 2010-10-01 NOTE — Progress Notes (Signed)
  Subjective:    Patient ID: [name], female    DOB: [birth year], 77 y.o.   MRN: [id number]  HPI  Follow up htn There has been questionable compliance with her medications she had a recent hospitalization for elevated blood pressure 82 confusion over medications and noncompliance.  She has currently been on complying drug of amlodipine and Benicar that has worked well for her blood pressure along with a small dose of hydrochlorothiazide and a half dose of her Tenormin.  She states however that she has been intermittently using the Tenormin.  The dizziness has resolved and she has no chest pain shortness of breath PND orthopnea  Review of Systems  Constitutional: Negative for activity change, appetite change and fatigue.  HENT: Negative for ear pain, congestion, neck pain, postnasal drip and sinus pressure.   Eyes: Negative for redness and visual disturbance.  Respiratory: Negative for cough, shortness of breath and wheezing.   Gastrointestinal: Negative for abdominal pain and abdominal distention.  Genitourinary: Negative for dysuria, frequency and menstrual problem.  Musculoskeletal: Negative for myalgias, joint swelling and arthralgias.  Skin: Negative for rash and wound.  Neurological: Negative for dizziness, weakness and headaches.  Hematological: Negative for adenopathy. Does not bruise/bleed easily.  Psychiatric/Behavioral: Negative for sleep disturbance and decreased concentration.       Objective:   Physical Exam  Nursing note and vitals reviewed. Constitutional: She is oriented to person, place, and time. She appears well-developed and well-nourished. No distress.  HENT:  Head: Normocephalic and atraumatic.  Right Ear: External ear normal.  Left Ear: External ear normal.  Nose: Nose normal.  Mouth/Throat: Oropharynx is clear and moist.  Eyes: Conjunctivae and EOM are normal. Pupils are equal, round, and reactive to light.  Neck: Normal range of motion. Neck  supple. No JVD present. No tracheal deviation present. No thyromegaly present.  Cardiovascular: Normal rate, regular rhythm, normal heart sounds and intact distal pulses.   No murmur heard. Pulmonary/Chest: Effort normal and breath sounds normal. She has no wheezes. She exhibits no tenderness.  Abdominal: Soft. Bowel sounds are normal.  Musculoskeletal: Normal range of motion. She exhibits no edema and no tenderness.  Lymphadenopathy:    She has no cervical adenopathy.  Neurological: She is alert and oriented to person, place, and time. She has normal reflexes. No cranial nerve deficit.  Skin: Skin is warm and dry. She is not diaphoretic.  Psychiatric: She has a normal mood and affect. Her behavior is normal.          Assessment & Plan:  We will change her medications to a combination pill of amlodipine 5 Benicar 40 and hydrochlorothiazide 12.5 and continue the 50 mg of Tenormin daily her blood pressure after rest had normalized today we explained this new regimen to her daughter and the patient and discussed the need to do salt limitation and exercise.  We spent more than 30 minutes face-to-face with patient counseling her about her blood pressure and medication use

## 2010-10-02 ENCOUNTER — Encounter: Payer: Self-pay | Admitting: Internal Medicine

## 2010-10-02 NOTE — Progress Notes (Signed)
Subjective:    Patient ID: [name], female    DOB: 1931/04/20, 77 y.o.   MRN: [id number]  HPI patient is a 77 year old white female who presents after a emergency room visit and brief hospitalization for elevated blood pressure.  His marked confusion about her medications but she should be taking what she has been taking and what the possible etiologies of her marked hypotension R. I do believe she has been changing her medications inappropriately discontinuing or intermittently using medications as a pot of part of the confusion as to what is causing her hypertension.  She has no mental status changes. Her daughter is with her to listen to her explanations of why she changing her medications part of the changes of her medications have been because of intermittent dizziness that she has associated with her medications and stopped started medications to treat this.      Review of Systems  Constitutional: Negative for activity change, appetite change and fatigue.  HENT: Negative for ear pain, congestion, neck pain, postnasal drip and sinus pressure.   Eyes: Negative for redness and visual disturbance.  Respiratory: Negative for cough, shortness of breath and wheezing.   Gastrointestinal: Negative for abdominal pain and abdominal distention.  Genitourinary: Negative for dysuria, frequency and menstrual problem.  Musculoskeletal: Negative for myalgias, joint swelling and arthralgias.  Skin: Negative for rash and wound.  Neurological: Negative for dizziness, weakness and headaches.  Hematological: Negative for adenopathy. Does not bruise/bleed easily.  Psychiatric/Behavioral: Negative for sleep disturbance and decreased concentration.       Objective:   Physical Exam  Constitutional: She is oriented to person, place, and time. She appears well-developed and well-nourished. No distress.  HENT:  Head: Normocephalic and atraumatic.  Right Ear: External ear normal.  Left Ear:  External ear normal.  Nose: Nose normal.  Mouth/Throat: Oropharynx is clear and moist.  Eyes: Conjunctivae and EOM are normal. Pupils are equal, round, and reactive to light.  Neck: Normal range of motion. Neck supple. No JVD present. No tracheal deviation present. No thyromegaly present.  Cardiovascular: Normal rate, regular rhythm, normal heart sounds and intact distal pulses.   No murmur heard. Pulmonary/Chest: Effort normal and breath sounds normal. She has no wheezes. She exhibits no tenderness.  Abdominal: Soft. Bowel sounds are normal.  Musculoskeletal: Normal range of motion. She exhibits no edema and no tenderness.  Lymphadenopathy:    She has no cervical adenopathy.  Neurological: She is alert and oriented to person, place, and time. She has normal reflexes. No cranial nerve deficit.  Skin: Skin is warm and dry. She is not diaphoretic.  Psychiatric: She has a normal mood and affect. Her behavior is normal.          Assessment & Plan:  The med reconciliation from the hospital has been quite confusing she has been on Azor in the past they put her on amlodipine but did not continue the Benicar component she has not been on the Tenormin since her hospitalization.  We discussed the need to continue her on losartan amlodipine S. previous and discussed the use of hydrochlorothiazide and half of her Tenormin on a daily basis to control her blood pressure.  Reviewed her medications and gave her a new med list as well as discussed with her daughter the appropriate dosing of these medications she will come back in 2 weeks' time to ensure that she has been able to comply with this.  I do not suspect that she has a memory  disorder Alzheimer's but certainly her failure to comply with a mean that there may be more of a need for more supervision

## 2010-10-17 ENCOUNTER — Telehealth: Payer: Self-pay | Admitting: Internal Medicine

## 2010-10-17 ENCOUNTER — Telehealth: Payer: Self-pay | Admitting: *Deleted

## 2010-10-17 NOTE — Telephone Encounter (Signed)
Patient called today to cancel her appt with Dr Lovell Sheehan, stating that she was feeling better. Apparently, she was with  her daughter at her daughter's appt and  Mrs Renaldo Fiddler  was complaining there with chest pains. They gave her an aspirin and she started to feel better. She then stated that someone from her daughter's office called here and someone had instructed that she come now to [place]. There was no message or appt made. The patient said that maybe they had contacted the wrong office. Once again, she stated that she is better. I offered her to speak with Debby or [place]. She declined to be seen or even speak with a nurse.

## 2010-10-17 NOTE — Telephone Encounter (Signed)
Per previous phone note, there was a voice mail on triage from a MD office telling us they were sending pt straight over here with chest pain and upper abd pain.   The gave the patient an aspirin before she left that office.  Pt called [place] and informed us she would not be coming.  Feeling better?

## 2010-12-17 ENCOUNTER — Ambulatory Visit (INDEPENDENT_AMBULATORY_CARE_PROVIDER_SITE_OTHER)
Admission: RE | Admit: 2010-12-17 | Discharge: 2010-12-17 | Disposition: A | Discharge status: Home / Self Care | Payer: Medicare HMO | Source: Ambulatory Visit | Attending: Internal Medicine | Admitting: Internal Medicine

## 2010-12-17 ENCOUNTER — Encounter: Payer: Self-pay | Admitting: Internal Medicine

## 2010-12-17 ENCOUNTER — Ambulatory Visit (INDEPENDENT_AMBULATORY_CARE_PROVIDER_SITE_OTHER): Payer: Medicare HMO | Admitting: Internal Medicine

## 2010-12-17 VITALS — BP 124/76 | HR 72 | Wt 184.0 lb

## 2010-12-17 DIAGNOSIS — M79609 Pain in unspecified limb: Secondary | ICD-10-CM

## 2010-12-17 DIAGNOSIS — M79671 Pain in right foot: Secondary | ICD-10-CM

## 2010-12-17 NOTE — Progress Notes (Signed)
  Subjective:    Patient ID: Hale Drone, female    DOB: 07-Aug-1931, 77 y.o.   MRN: [id number]  HPI  Patient comes in today for SDA  For acute problem evaluation. Generally well but then had sudden  Onset last pm lateral foot and excruciating last pm  hard to sleep with this.    Happened a few year ago s and resolved. ? Cause  This am pain was severe and tried some cream on it   Arthritis cream helped ( tolamine salicylate 10%)  Now is better  . No swelling or back pain or radiation .No trauma  No new shoes.  Or heels.   Review of Systems No fever gout hx but hx of some in family    No rash there  No significant arthritis  Or hx f fracture      Objective:   Physical Exam WDWN in nad  Looks well wearing slip on shoes. FOOT : no redness or swelling NV intact no point tenderness but points to lateral 5th metatarsal as area of pain area.  Pulse present nl temperature.  No redness or rash  ankle seems stable       Assessment & Plan:

## 2010-12-17 NOTE — Patient Instructions (Signed)
I do not know why your had the severe pain.  But  Will get x ray and if ok then recheck if comes back. Otherwise good foot wear   .

## 2010-12-18 ENCOUNTER — Telehealth: Payer: Self-pay | Admitting: *Deleted

## 2010-12-18 DIAGNOSIS — M959 Acquired deformity of musculoskeletal system, unspecified: Secondary | ICD-10-CM

## 2010-12-18 NOTE — Telephone Encounter (Signed)
Message copied by Romualdo Bolk on Thu Dec 18, 2010 12:42 PM ------      Message from: [facility] [place] [facility]      Created: Wed Dec 17, 2010  5:28 PM       Advise patient that the x ray shows a fracture deformity that could be old  But seems to be at area of her pain.       I would recommend we have her see orthopedics .        Please do referral for fracture right foot.?       They may need to rexray the area       Please do ortho referral .

## 2010-12-18 NOTE — Telephone Encounter (Signed)
Pt aware of results. Pt aware of results.

## 2011-01-01 ENCOUNTER — Encounter: Payer: Self-pay | Admitting: Internal Medicine

## 2011-01-01 ENCOUNTER — Ambulatory Visit (INDEPENDENT_AMBULATORY_CARE_PROVIDER_SITE_OTHER): Payer: Medicare HMO | Admitting: Internal Medicine

## 2011-01-01 VITALS — BP 140/76 | HR 72 | Temp 98.6°F | Resp 16 | Ht 65.0 in | Wt 182.0 lb

## 2011-01-01 DIAGNOSIS — R7309 Other abnormal glucose: Secondary | ICD-10-CM

## 2011-01-01 DIAGNOSIS — F329 Major depressive disorder, single episode, unspecified: Secondary | ICD-10-CM

## 2011-01-01 DIAGNOSIS — T887XXA Unspecified adverse effect of drug or medicament, initial encounter: Secondary | ICD-10-CM

## 2011-01-01 DIAGNOSIS — F32A Depression, unspecified: Secondary | ICD-10-CM

## 2011-01-01 DIAGNOSIS — I10 Essential (primary) hypertension: Secondary | ICD-10-CM

## 2011-01-01 DIAGNOSIS — R42 Dizziness and giddiness: Secondary | ICD-10-CM

## 2011-01-01 MED ORDER — DULOXETINE HCL 30 MG PO CPEP: 30.0000 mg | ORAL_CAPSULE | Freq: Every day | ORAL | Status: DC

## 2011-01-01 NOTE — Patient Instructions (Signed)
Take the cymbalta one a day

## 2011-01-01 NOTE — Progress Notes (Signed)
  Subjective:    Patient ID: [name], female    DOB: November 25, 1931, 77 y.o.   MRN: [id number]  HPI follow up for medicaton and for HTN Stable with resolution of dizzyness Mood is poor with depression and anxiety Lethargic, increased somnolence tearfullness Loss of eye contact   Review of Systems  Constitutional: Negative for activity change, appetite change and fatigue.  HENT: Negative for ear pain, congestion, neck pain, postnasal drip and sinus pressure.   Eyes: Negative for redness and visual disturbance.  Respiratory: Negative for cough, shortness of breath and wheezing.   Gastrointestinal: Negative for abdominal pain and abdominal distention.  Genitourinary: Negative for dysuria, frequency and menstrual problem.  Musculoskeletal: Negative for myalgias, joint swelling and arthralgias.  Skin: Negative for rash and wound.  Neurological: Negative for dizziness, weakness and headaches.  Hematological: Negative for adenopathy. Does not bruise/bleed easily.  Psychiatric/Behavioral: Negative for sleep disturbance and decreased concentration.   Past Medical History  Diagnosis Date  . Diabetes mellitus   . Hypertension   . Fibrocystic breast disease   . Meningioma   . Tendon tear, ankle    Past Surgical History  Procedure Date  . Breast lumpectomy   . Tonsillectomy   . Tendon tear   . Colonoscopy     05/25/2000. Results: Normal. Hemorrhoids    reports that she has never smoked. She does not have any smokeless tobacco history on file. She reports that she does not drink alcohol or use illicit drugs. family history includes Cancer in her mother; Heart disease in her father; and Stroke in her father. Allergies  Allergen Reactions  . Sulfonamide Derivatives     REACTION: childhood unknown type        Objective:   Physical Exam  Nursing note and vitals reviewed. Constitutional: She is oriented to person, place, and time. She appears well-developed and well-nourished.  No distress.  HENT:  Head: Normocephalic and atraumatic.  Right Ear: External ear normal.  Left Ear: External ear normal.  Nose: Nose normal.  Mouth/Throat: Oropharynx is clear and moist.  Eyes: Conjunctivae and EOM are normal. Pupils are equal, round, and reactive to light.  Neck: Normal range of motion. Neck supple. No JVD present. No tracheal deviation present. No thyromegaly present.  Cardiovascular: Normal rate, regular rhythm, normal heart sounds and intact distal pulses.   No murmur heard. Pulmonary/Chest: Effort normal and breath sounds normal. She has no wheezes. She exhibits no tenderness.  Abdominal: Soft. Bowel sounds are normal.  Musculoskeletal: Normal range of motion. She exhibits no edema and no tenderness.  Lymphadenopathy:    She has no cervical adenopathy.  Neurological: She is alert and oriented to person, place, and time. She has normal reflexes. No cranial nerve deficit.  Skin: Skin is warm and dry. She is not diaphoretic.  Psychiatric: She has a normal mood and affect. Her behavior is normal.          Assessment & Plan:  Trial of cymbalta Follow up in 2 months Stay on mycardis Ear tinnitis persistant

## 2011-02-26 DIAGNOSIS — IMO0002 Reserved for concepts with insufficient information to code with codable children: Secondary | ICD-10-CM | POA: Insufficient documentation

## 2011-03-06 ENCOUNTER — Other Ambulatory Visit: Payer: Self-pay | Admitting: Internal Medicine

## 2011-03-07 ENCOUNTER — Ambulatory Visit: Payer: Medicare HMO | Admitting: Family Medicine

## 2011-04-06 ENCOUNTER — Ambulatory Visit: Payer: Medicare HMO | Admitting: Internal Medicine

## 2011-04-07 ENCOUNTER — Encounter: Payer: Self-pay | Admitting: Internal Medicine

## 2011-04-07 ENCOUNTER — Ambulatory Visit (INDEPENDENT_AMBULATORY_CARE_PROVIDER_SITE_OTHER): Payer: Medicare HMO | Admitting: Internal Medicine

## 2011-04-07 DIAGNOSIS — E785 Hyperlipidemia, unspecified: Secondary | ICD-10-CM

## 2011-04-07 DIAGNOSIS — T887XXA Unspecified adverse effect of drug or medicament, initial encounter: Secondary | ICD-10-CM

## 2011-04-07 DIAGNOSIS — I10 Essential (primary) hypertension: Secondary | ICD-10-CM

## 2011-04-07 MED ORDER — QUINAPRIL-HYDROCHLOROTHIAZIDE 20-25 MG PO TABS: 1.0000 | ORAL_TABLET | Freq: Every day | ORAL | Status: DC

## 2011-04-07 NOTE — Progress Notes (Signed)
Subjective:    Patient ID: [name], female    DOB: 1931-03-10, 78 y.o.   MRN: [id number]  HPI  Patient is a 78 year old white female who is followed for hypertension. Her insurance no longer covers by Mikle Bosworth a reasonable price issues that have to change her medications.  I reviewed her chart and saw that she had had no prior history of allergic reaction to ACE inhibitor so we can use a generic ACE and it is strong enough to control her blood pressure.  She is on Tenormin in the afternoon to control her pulse she has the risk factors of hyperlipidemia hypertension and age.  She has had TIAs in the past.  Review of Systems  Constitutional: Negative for activity change, appetite change and fatigue.  HENT: Negative for ear pain, congestion, neck pain, postnasal drip and sinus pressure.   Eyes: Negative for redness and visual disturbance.  Respiratory: Negative for cough, shortness of breath and wheezing.   Gastrointestinal: Negative for abdominal pain and abdominal distention.  Genitourinary: Negative for dysuria, frequency and menstrual problem.  Musculoskeletal: Negative for myalgias, joint swelling and arthralgias.  Skin: Negative for rash and wound.  Neurological: Negative for dizziness, weakness and headaches.  Hematological: Negative for adenopathy. Does not bruise/bleed easily.  Psychiatric/Behavioral: Negative for sleep disturbance and decreased concentration.   Past Medical History  Diagnosis Date  . Diabetes mellitus   . Hypertension   . Fibrocystic breast disease   . Meningioma   . Tendon tear, ankle     History   Social History  . Marital Status: Widowed    Spouse Name: N/A    Number of Children: N/A  . Years of Education: N/A   Occupational History  . Not on file.   Social History Main Topics  . Smoking status: Never Smoker   . Smokeless tobacco: Not on file  . Alcohol Use: No  . Drug Use: No  . Sexually Active: No   Other Topics Concern  .  Not on file   Social History Narrative  . No narrative on file    Past Surgical History  Procedure Date  . Breast lumpectomy   . Tonsillectomy   . Tendon tear   . Colonoscopy     05/25/2000. Results: Normal. Hemorrhoids    Family History  Problem Relation Age of Onset  . Cancer Mother   . Heart disease Father   . Stroke Father     Allergies  Allergen Reactions  . Sulfonamide Derivatives     REACTION: childhood unknown type    Current Outpatient Prescriptions on File Prior to Visit  Medication Sig Dispense Refill  . aspirin 81 MG tablet Take 81 mg by mouth daily.        Marland Kitchen atenolol (TENORMIN) 100 MG tablet TAKE 1 TABLET BY MOUTH ONCE DAILY  30 tablet  4  . Cholecalciferol (VITAMIN D) 400 UNITS capsule Take 400 Units by mouth daily.        . meclizine (ANTIVERT) 25 MG tablet Take 25 mg by mouth 3 (three) times daily as needed.        . Red Yeast Rice Extract 600 MG TABS Take by mouth daily.          BP 136/80  Pulse 76  Temp 98.2 F (36.8 C)  Resp 16  Ht 5\' 5"  (1.651 m)  Wt 182 lb (82.555 kg)  BMI 30.29 kg/m2        Objective:   Physical Exam  Constitutional: She is oriented to person, place, and time. She appears well-developed and well-nourished. No distress.  HENT:  Head: Normocephalic and atraumatic.  Right Ear: External ear normal.  Left Ear: External ear normal.  Nose: Nose normal.  Mouth/Throat: Oropharynx is clear and moist.  Eyes: Conjunctivae and EOM are normal. Pupils are equal, round, and reactive to light.  Neck: Normal range of motion. Neck supple. No JVD present. No tracheal deviation present. No thyromegaly present.  Cardiovascular: Normal rate, regular rhythm, normal heart sounds and intact distal pulses.   No murmur heard. Pulmonary/Chest: Effort normal and breath sounds normal. She has no wheezes. She exhibits no tenderness.  Abdominal: Soft. Bowel sounds are normal.  Musculoskeletal: Normal range of motion. She exhibits no edema and no  tenderness.  Lymphadenopathy:    She has no cervical adenopathy.  Neurological: She is alert and oriented to person, place, and time. She has normal reflexes. No cranial nerve deficit.  Skin: Skin is warm and dry. She is not diaphoretic.  Psychiatric: She has a normal mood and affect. Her behavior is normal.          Assessment & Plan:  We will change her medication to him as a probe hydrochlorothiazide 20/25 and monitor her blood pressure on it continue combination of that drug and atenolol in the afternoon.  In 2 months time we will monitor basic metabolic panel to make sure the potassium is adequate on this medication.  She is to keep record of her blood pressures and bring across at that time she'll contact us if her blood pressure becomes elevated with the change of medications.  She admitted to [place] that she did not ever take the Cymbalta but feels that she is doing better with prior study. We discussed her mood disorder and she promised that if she ever became despondent she would contact our office before any action taken.

## 2011-04-07 NOTE — Patient Instructions (Signed)
Replace the mycardis with a new Accuretic that his Accupril and hydrochlorothiazide in 1 tablet Deep taking the atenolol in the afternoon and take the new medicine in the morning instead of the mycardis

## 2011-04-08 NOTE — Progress Notes (Signed)
  Subjective:    Patient ID: [name], female    DOB: [birth year], 78 y.o.   MRN: [id number]  HPI    Review of Systems     Objective:   Physical Exam        Assessment & Plan:

## 2011-06-05 ENCOUNTER — Ambulatory Visit (INDEPENDENT_AMBULATORY_CARE_PROVIDER_SITE_OTHER): Payer: Medicare HMO | Admitting: Internal Medicine

## 2011-06-05 ENCOUNTER — Encounter: Payer: Self-pay | Admitting: Internal Medicine

## 2011-06-05 VITALS — BP 144/80 | HR 76 | Temp 98.2°F | Resp 16 | Ht 65.0 in | Wt 184.0 lb

## 2011-06-05 DIAGNOSIS — I10 Essential (primary) hypertension: Secondary | ICD-10-CM

## 2011-06-05 DIAGNOSIS — E785 Hyperlipidemia, unspecified: Secondary | ICD-10-CM

## 2011-06-05 DIAGNOSIS — T887XXA Unspecified adverse effect of drug or medicament, initial encounter: Secondary | ICD-10-CM

## 2011-06-05 LAB — BASIC METABOLIC PANEL
CO2: 34 mEq/L — ABNORMAL HIGH (ref 19–32)
Chloride: 97 mEq/L (ref 96–112)
Potassium: 4 mEq/L (ref 3.5–5.1)
Sodium: 138 mEq/L (ref 135–145)

## 2011-06-05 LAB — CBC WITH DIFFERENTIAL/PLATELET
Basophils Relative: 0.5 % (ref 0.0–3.0)
Eosinophils Absolute: 0.2 10*3/uL (ref 0.0–0.7)
Eosinophils Relative: 2.9 % (ref 0.0–5.0)
HCT: 41.6 % (ref 36.0–46.0)
Hemoglobin: 14 g/dL (ref 12.0–15.0)
MCHC: 33.7 g/dL (ref 30.0–36.0)
MCV: 84.2 fl (ref 78.0–100.0)
Monocytes Absolute: 0.5 10*3/uL (ref 0.1–1.0)
Neutro Abs: 3.5 10*3/uL (ref 1.4–7.7)
Neutrophils Relative %: 56.6 % (ref 43.0–77.0)
RBC: 4.94 Mil/uL (ref 3.87–5.11)
WBC: 6.1 10*3/uL (ref 4.5–10.5)

## 2011-06-05 LAB — LIPID PANEL
Total CHOL/HDL Ratio: 7
Triglycerides: 360 mg/dL — ABNORMAL HIGH (ref 0.0–149.0)

## 2011-06-05 LAB — LDL CHOLESTEROL, DIRECT: Direct LDL: 178.5 mg/dL

## 2011-06-05 NOTE — Progress Notes (Signed)
  Subjective:    Patient ID: [name], female    DOB: [birth year], 78 y.o.   MRN: [id number]  HPI Pt is a 78 year old white female who is followed for both hypertension and hyperlipidemia.  She states she is compliant with her medications has tolerated the medication without any difficulty the new medication has a combination of an ACE inhibitor and a diuretic it is an increased amount of diuretic and that she is now at 25 mg of hydrochlorothiazide.  She denies any chest pain shortness of breath PND orthopnea she states she tries to go the Y. about 2-3 times a week  Review of Systems  Constitutional: Negative for activity change, appetite change and fatigue.  HENT: Negative for ear pain, congestion, neck pain, postnasal drip and sinus pressure.   Eyes: Negative for redness and visual disturbance.  Respiratory: Negative for cough, shortness of breath and wheezing.   Gastrointestinal: Negative for abdominal pain and abdominal distention.  Genitourinary: Negative for dysuria, frequency and menstrual problem.  Musculoskeletal: Negative for myalgias, joint swelling and arthralgias.  Skin: Negative for rash and wound.  Neurological: Negative for dizziness, weakness and headaches.  Hematological: Negative for adenopathy. Does not bruise/bleed easily.  Psychiatric/Behavioral: Negative for sleep disturbance and decreased concentration.       Objective:   Physical Exam  Nursing note and vitals reviewed. Constitutional: She is oriented to person, place, and time. She appears well-developed and well-nourished. No distress.  HENT:  Head: Normocephalic and atraumatic.  Right Ear: External ear normal.  Left Ear: External ear normal.  Nose: Nose normal.  Mouth/Throat: Oropharynx is clear and moist.  Eyes: Conjunctivae and EOM are normal. Pupils are equal, round, and reactive to light.  Neck: Normal range of motion. Neck supple. No JVD present. No tracheal deviation present. No thyromegaly  present.  Cardiovascular: Normal rate, regular rhythm, normal heart sounds and intact distal pulses.   No murmur heard. Pulmonary/Chest: Effort normal and breath sounds normal. She has no wheezes. She exhibits no tenderness.  Abdominal: Soft. Bowel sounds are normal.  Musculoskeletal: Normal range of motion. She exhibits no edema and no tenderness.  Lymphadenopathy:    She has no cervical adenopathy.  Neurological: She is alert and oriented to person, place, and time. She has normal reflexes. No cranial nerve deficit.  Skin: Skin is warm and dry. She is not diaphoretic.  Psychiatric: She has a normal mood and affect. Her behavior is normal.          Assessment & Plan:  We'll monitor basic metabolic panel today for both renal function and potassium and creatinine on increased diuretic.  We have discussed continuing this medication as her blood pressure remains generally stable.  She has a history of a meningioma but no neurological changes that would indicate growth or problems from the meningioma.  She has a history of hyperlipidemia and will get a lipid panel today to monitor her therapy which currently consists of taking omega-3 fatty acids.  She'll present in 3 months time unless data is collected from the blood work that we'll need immediate intervention

## 2011-06-05 NOTE — Patient Instructions (Signed)
The patient is instructed to continue all medications as prescribed. Schedule followup with check out clerk upon leaving the clinic  

## 2011-06-22 ENCOUNTER — Telehealth: Payer: Self-pay | Admitting: Internal Medicine

## 2011-06-22 NOTE — Telephone Encounter (Signed)
Pt's children are wanting to let Dr. Lovell Sheehan to know her memory is getting very bad, and needs to be addressed.  She if forgetting faces, and names.

## 2011-06-22 NOTE — Telephone Encounter (Signed)
Patient called stating that she would like a call back with labs results. Please assist.

## 2011-06-22 NOTE — Telephone Encounter (Signed)
Pt informed of labs

## 2011-09-16 ENCOUNTER — Telehealth: Payer: Self-pay | Admitting: Internal Medicine

## 2011-09-16 NOTE — Telephone Encounter (Signed)
Caller: Kortlyn/Patient; PCP: Darryll Capers; CB#: [phone]; Call regarding Medication Questions; States she has a bottle of Losartan at home and asks if she can take that instead of Quniapril.  She relates "these blood pressure meds make me feel down". She also asked for dosing on Losartan if provider approves.   Information noted and sent to provider per Medication Questions protocol.

## 2011-09-17 NOTE — Telephone Encounter (Signed)
Talked with pt and made appointment with dr Lovell Sheehan inaugust to discuss  Med change/bmw

## 2011-10-23 ENCOUNTER — Encounter: Payer: Self-pay | Admitting: Internal Medicine

## 2011-10-23 ENCOUNTER — Ambulatory Visit (INDEPENDENT_AMBULATORY_CARE_PROVIDER_SITE_OTHER): Payer: Medicare HMO | Admitting: Internal Medicine

## 2011-10-23 VITALS — BP 164/90 | HR 76 | Temp 98.2°F | Resp 16 | Ht 65.0 in | Wt 180.0 lb

## 2011-10-23 DIAGNOSIS — E785 Hyperlipidemia, unspecified: Secondary | ICD-10-CM

## 2011-10-23 DIAGNOSIS — I10 Essential (primary) hypertension: Secondary | ICD-10-CM

## 2011-10-23 MED ORDER — ATENOLOL 100 MG PO TABS: 50.0000 mg | ORAL_TABLET | Freq: Every day | ORAL | Status: DC

## 2011-10-23 MED ORDER — AMLODIPINE-OLMESARTAN 5-40 MG PO TABS: 1.0000 | ORAL_TABLET | Freq: Every day | ORAL | Status: DC

## 2011-10-23 NOTE — Progress Notes (Signed)
  Subjective:    Patient ID: [name], female    DOB: [birth year], 78 y.o.   MRN: [id number]  HPI  Patient is a 78 year old female who discontinued her blood pressure medicine because she thought it was causing "liver spots".  Her blood pressure today is elevated she has a history of TIAs she is a high risk for stroke given her multiple medical problems.  We discussed her medications tolerance of her medications and the need for compliance of her medications.  We agreed that we would change the medications to a new drug that might not have a risk of "liver spots"  Review of Systems  Constitutional: Negative for activity change, appetite change and fatigue.  HENT: Negative for ear pain, congestion, neck pain, postnasal drip and sinus pressure.   Eyes: Negative for redness and visual disturbance.  Respiratory: Negative for cough, shortness of breath and wheezing.   Gastrointestinal: Negative for abdominal pain and abdominal distention.  Genitourinary: Negative for dysuria, frequency and menstrual problem.  Musculoskeletal: Negative for myalgias, joint swelling and arthralgias.  Skin: Negative for rash and wound.  Neurological: Negative for dizziness, weakness and headaches.  Hematological: Negative for adenopathy. Does not bruise/bleed easily.  Psychiatric/Behavioral: Negative for disturbed wake/sleep cycle and decreased concentration.       Objective:   Physical Exam  Nursing note and vitals reviewed. Constitutional: She is oriented to person, place, and time. She appears well-developed and well-nourished. No distress.  HENT:  Head: Normocephalic and atraumatic.  Right Ear: External ear normal.  Left Ear: External ear normal.  Nose: Nose normal.  Mouth/Throat: Oropharynx is clear and moist.  Eyes: Conjunctivae and EOM are normal. Pupils are equal, round, and reactive to light.  Neck: Normal range of motion. Neck supple. No JVD present. No tracheal deviation present. No  thyromegaly present.  Cardiovascular: Normal rate, regular rhythm, normal heart sounds and intact distal pulses.   No murmur heard. Pulmonary/Chest: Effort normal and breath sounds normal. She has no wheezes. She exhibits no tenderness.  Abdominal: Soft. Bowel sounds are normal.  Musculoskeletal: Normal range of motion. She exhibits no edema and no tenderness.  Lymphadenopathy:    She has no cervical adenopathy.  Neurological: She is alert and oriented to person, place, and time. She has normal reflexes. No cranial nerve deficit.  Skin: Skin is warm and dry. She is not diaphoretic.  Psychiatric: She has a normal mood and affect. Her behavior is normal.          Assessment & Plan:  We will begin Azor 5/40 one by mouth day continue the atenolol.  Monitor blood pressure on a regular basis. Stressed compliance with medication

## 2011-10-23 NOTE — Patient Instructions (Signed)
Recommend that you call and get a mammogram.  The new medicine called Azor is once a day start the medicine tonight

## 2011-10-27 ENCOUNTER — Other Ambulatory Visit: Payer: Self-pay | Admitting: Internal Medicine

## 2011-10-27 ENCOUNTER — Telehealth: Payer: Self-pay | Admitting: Internal Medicine

## 2011-10-27 DIAGNOSIS — Z1231 Encounter for screening mammogram for malignant neoplasm of breast: Secondary | ICD-10-CM

## 2011-10-27 NOTE — Telephone Encounter (Signed)
Pt has questions about meds that dr Lovell Sheehan gave to her. Please call and advise asap.

## 2011-10-27 NOTE — Telephone Encounter (Signed)
Pt didn't answer. 

## 2011-10-30 ENCOUNTER — Encounter: Payer: Medicare HMO | Admitting: Internal Medicine

## 2011-11-04 ENCOUNTER — Telehealth: Payer: Self-pay | Admitting: Internal Medicine

## 2011-11-04 DIAGNOSIS — I10 Essential (primary) hypertension: Secondary | ICD-10-CM

## 2011-11-04 NOTE — Telephone Encounter (Signed)
Pt called and said that she has been taking amLODipine-olmesartan (AZOR) 5-40 MG per tablet, but she said that she normally takes med with diuretic in it. Pt wants to know if she needs a diurectic now or not.

## 2011-11-05 NOTE — Telephone Encounter (Signed)
Pt aware.

## 2011-11-05 NOTE — Telephone Encounter (Signed)
No answer, no machine.

## 2011-11-05 NOTE — Telephone Encounter (Signed)
Monitor blood pressure on the azor alone

## 2011-11-13 ENCOUNTER — Ambulatory Visit (HOSPITAL_COMMUNITY)
Admission: RE | Admit: 2011-11-13 | Discharge: 2011-11-13 | Disposition: A | Discharge status: Home / Self Care | Payer: Medicare Other | Source: Ambulatory Visit | Attending: Internal Medicine | Admitting: Internal Medicine

## 2011-11-13 DIAGNOSIS — Z1231 Encounter for screening mammogram for malignant neoplasm of breast: Secondary | ICD-10-CM | POA: Insufficient documentation

## 2011-12-03 ENCOUNTER — Ambulatory Visit (INDEPENDENT_AMBULATORY_CARE_PROVIDER_SITE_OTHER): Payer: Medicare Other | Admitting: Internal Medicine

## 2011-12-03 ENCOUNTER — Encounter: Payer: Self-pay | Admitting: Internal Medicine

## 2011-12-03 VITALS — BP 160/90 | Temp 97.9°F | Wt 181.0 lb

## 2011-12-03 DIAGNOSIS — I10 Essential (primary) hypertension: Secondary | ICD-10-CM

## 2011-12-03 DIAGNOSIS — M542 Cervicalgia: Secondary | ICD-10-CM

## 2011-12-03 MED ORDER — NAPROXEN SODIUM ER 500 MG PO TB24: 500.0000 mg | ORAL_TABLET | Freq: Every day | ORAL | Status: DC

## 2011-12-03 NOTE — Progress Notes (Signed)
  Subjective:    Patient ID: [name], female    DOB: April 15, 1931, 78 y.o.   MRN: [id number]  HPI  78 year old patient who presents with a two-day history of posterior neck and upper back pain. Pain has been intermittent. No fall or known injury. She has had some neck pain in the past but none recently. No headaches no constitutional complaints denies any fever  Past Medical History  Diagnosis Date  . Diabetes mellitus   . Hypertension   . Fibrocystic breast disease   . Meningioma   . Tendon tear, ankle     History   Social History  . Marital Status: Widowed    Spouse Name: N/A    Number of Children: N/A  . Years of Education: N/A   Occupational History  . Not on file.   Social History Main Topics  . Smoking status: Never Smoker   . Smokeless tobacco: Not on file  . Alcohol Use: No  . Drug Use: No  . Sexually Active: No   Other Topics Concern  . Not on file   Social History Narrative  . No narrative on file    Past Surgical History  Procedure Date  . Breast lumpectomy   . Tonsillectomy   . Tendon tear   . Colonoscopy     05/25/2000. Results: Normal. Hemorrhoids    Family History  Problem Relation Age of Onset  . Cancer Mother   . Heart disease Father   . Stroke Father     Allergies  Allergen Reactions  . Sulfonamide Derivatives     REACTION: childhood unknown type    Current Outpatient Prescriptions on File Prior to Visit  Medication Sig Dispense Refill  . amLODipine-olmesartan (AZOR) 5-40 MG per tablet Take 1 tablet by mouth daily.  30 tablet  2  . aspirin 81 MG tablet Take 81 mg by mouth daily.        Marland Kitchen atenolol (TENORMIN) 100 MG tablet Take 0.5 tablets (50 mg total) by mouth daily.  30 tablet  4  . Cholecalciferol (VITAMIN D) 400 UNITS capsule Take 400 Units by mouth daily.        . meclizine (ANTIVERT) 25 MG tablet Take 25 mg by mouth 3 (three) times daily as needed.        . Red Yeast Rice Extract 600 MG TABS Take by mouth daily.            BP 160/90  Temp 97.9 F (36.6 C) (Oral)  Wt 181 lb (82.101 kg)       Review of Systems  Constitutional: Negative for fever, chills and fatigue.  Musculoskeletal:       Neck pain right greater than the left       Objective:   Physical Exam  Constitutional: She appears well-developed and well-nourished. No distress.       Repeat blood pressure 140/80  Neck: Normal range of motion.       The right posterior neck musculature was quite tight and tense the trapezius musculature especially on the right also tender and  Tense Pain is aggravated by head turning to the right          Assessment & Plan:   Neck  Pain.  Samples of Naprelan dispense.  Call if unimproved

## 2011-12-03 NOTE — Patient Instructions (Signed)
You  may move around, but avoid painful motions and activities.  Apply ice to the sore area for 15 to 20 minutes 3 or 4 times daily for the next two to 3 days.  Naprelan 500 mg once dailyCervical Sprain A cervical sprain is an injury in the neck in which the ligaments are stretched or torn. The ligaments are the tissues that hold the bones of the neck (vertebrae) in place. Cervical sprains can range from very mild to very severe. Most cervical sprains get better in 1 to 3 weeks, but it depends on the cause and extent of the injury. Severe cervical sprains can cause the neck vertebrae to be unstable. This can lead to damage of the spinal cord and can result in serious nervous system problems. Your caregiver will determine whether your cervical sprain is mild or severe. CAUSES   Severe cervical sprains may be caused by:  Contact sport injuries (football, rugby, wrestling, hockey, auto racing, gymnastics, diving, martial arts, boxing).   Motor vehicle collisions.   Whiplash injuries. This means the neck is forcefully whipped backward and forward.   Falls.  Mild cervical sprains may be caused by:    Awkward positions, such as cradling a telephone between your ear and shoulder.   Sitting in a chair that does not offer proper support.   Working at a poorly Marketing executive station.   Activities that require looking up or down for long periods of time.  SYMPTOMS    Pain, soreness, stiffness, or a burning sensation in the front, back, or sides of the neck. This discomfort may develop immediately after injury or it may develop slowly and not begin for 24 hours or more after an injury.   Pain or tenderness directly in the middle of the back of the neck.   Shoulder or upper back pain.   Limited ability to move the neck.   Headache.   Dizziness.   Weakness, numbness, or tingling in the hands or arms.   Muscle spasms.   Difficulty swallowing or chewing.   Tenderness and swelling of the  neck.  DIAGNOSIS   Most of the time, your caregiver can diagnose this problem by taking your history and doing a physical exam. Your caregiver will ask about any known problems, such as arthritis in the neck or a previous neck injury. X-rays may be taken to find out if there are any other problems, such as problems with the bones of the neck. However, an X-ray often does not reveal the full extent of a cervical sprain. Other tests such as a computed tomography (CT) scan or magnetic resonance imaging (MRI) may be needed. TREATMENT   Treatment depends on the severity of the cervical sprain. Mild sprains can be treated with rest, keeping the neck in place (immobilization), and pain medicines. Severe cervical sprains need immediate immobilization and an appointment with an orthopedist or neurosurgeon. Several treatment options are available to help with pain, muscle spasms, and other symptoms. Your caregiver may prescribe:  Medicines, such as pain relievers, numbing medicines, or muscle relaxants.   Physical therapy. This can include stretching exercises, strengthening exercises, and posture training. Exercises and improved posture can help stabilize the neck, strengthen muscles, and help stop symptoms from returning.   A neck collar to be worn for short periods of time. Often, these collars are worn for comfort. However, certain collars may be worn to protect the neck and prevent further worsening of a serious cervical sprain.  HOME CARE INSTRUCTIONS  Put ice on the injured area.   Put ice in a plastic bag.   Place a towel between your skin and the bag.   Leave the ice on for 15 to 20 minutes, 3 to 4 times a day.   Only take over-the-counter or prescription medicines for pain, discomfort, or fever as directed by your caregiver.   Keep all follow-up appointments as directed by your caregiver.   Keep all physical therapy appointments as directed by your caregiver.   If a neck collar is  prescribed, wear it as directed by your caregiver.   Do not drive while wearing a neck collar.   Make any needed adjustments to your work station to promote good posture.   Avoid positions and activities that make your symptoms worse.   Warm up and stretch before being active to help prevent problems.  SEEK MEDICAL CARE IF:    Your pain is not controlled with medicine.   You are unable to decrease your pain medicine over time as planned.   Your activity level is not improving as expected.  SEEK IMMEDIATE MEDICAL CARE IF:    You develop any bleeding, stomach upset, or signs of an allergic reaction to your medicine.   Your symptoms get worse.   You develop new, unexplained symptoms.   You have numbness, tingling, weakness, or paralysis in any part of your body.  MAKE SURE YOU:    Understand these instructions.   Will watch your condition.   Will get help right away if you are not doing well or get worse.  Document Released: 12/14/2006 Document Revised: 05/11/2011 Document Reviewed: 11/19/2010 [facility] Patient Information 2013 [place], [place].

## 2011-12-04 ENCOUNTER — Telehealth: Payer: Self-pay | Admitting: Internal Medicine

## 2011-12-04 ENCOUNTER — Other Ambulatory Visit: Payer: Self-pay | Admitting: Internal Medicine

## 2011-12-04 MED ORDER — CHLORZOXAZONE 500 MG PO TABS: 500.0000 mg | ORAL_TABLET | Freq: Three times a day (TID) | ORAL | Status: DC | PRN

## 2011-12-04 MED ORDER — TRAMADOL HCL 50 MG PO TABS: 50.0000 mg | ORAL_TABLET | Freq: Four times a day (QID) | ORAL | Status: DC | PRN

## 2011-12-04 NOTE — Telephone Encounter (Signed)
Per dr Cameron Ali stop naprosyn and take ultram.. No answer 2pm

## 2011-12-04 NOTE — Telephone Encounter (Signed)
Pt is wondering if she can take a aspirin along with the Naproxen? Pt says that one Naproxen a day is not helping with pain at all. Pls call.

## 2011-12-04 NOTE — Telephone Encounter (Signed)
Caller: [name]/Patient; Patient Name: [name]; PCP: Darryll Capers (Adults only); Best Callback Phone Number: [phone]  Patient states she developed pain in posterior neck and upper back 12/02/11. Patient states she was seen in the office 12/03/11 and prescribed Naprosyn. Patient states no relief with Naprosyn. Denies known injury. Afebrile. No nuccal rigidity. Patient states pain is unbearable.  Triage per Neck Pain Protocol. Care advice given per guidelines realted to positive triage assessment for " Unbearable Pain." Patient advised not to drive. Patient declines offered appointment at 1430 12/04/11. Patient declines to be evaluated in the Emergency Department.  Patient requesting to leave a message for Dr. Lovell Sheehan regarding possible Xray or medication change. Patient uses [facility] on [address] at [phone]. PLEASE RETURN CALL TO PATIENT AT [phone] REGARDING ABOVE.

## 2011-12-04 NOTE — Telephone Encounter (Signed)
Pt requesting Dr. Lovell Sheehan advise - please assist

## 2011-12-04 NOTE — Telephone Encounter (Signed)
Per dr Lovell Sheehan- may have parafon forte #30 1 tid prn-sent in

## 2011-12-04 NOTE — Telephone Encounter (Signed)
Per dr Lovell Sheehan- may also have parafon forte

## 2011-12-04 NOTE — Telephone Encounter (Signed)
Caller: Wanda/Child; Patient Name: [name]; PCP: Darryll Capers; Best Callback Phone Number: [phone]; Call regarding: Neck Pain; was seen in the office 12/03/11 and diagnosed with a cervical strain; given rx for Naproxen, but had no relief; called today at 1347 and requested a medication change; declined an appt; rx then called in for Ultram, but seems to be having no relief; c/o spasms; All emergent sxs of Neck Pain or Injury protocol r/o except "unbearable pain"; disp ED; declines need to be evaluated in the ED; requesting a neck brace and a muscle relaxer; [facility] [address] [phone]; please call back

## 2011-12-11 ENCOUNTER — Telehealth: Payer: Self-pay | Admitting: Internal Medicine

## 2011-12-11 NOTE — Telephone Encounter (Signed)
Caller: [name]/Patient; Patient Name: [name]; PCP: Darryll Capers (Adults only); Best Callback Phone Number: [phone].  Follow up from Neck Pain.  While waiting for call back, Pt placed neck brace on and pain was relieved. Traige offered, Pt feels she is ok at this time. Pt will follow up with office on 10-14.

## 2011-12-14 ENCOUNTER — Other Ambulatory Visit: Payer: Self-pay | Admitting: Internal Medicine

## 2011-12-14 NOTE — Telephone Encounter (Signed)
FYI

## 2011-12-18 ENCOUNTER — Telehealth: Payer: Self-pay | Admitting: Internal Medicine

## 2011-12-18 ENCOUNTER — Ambulatory Visit (INDEPENDENT_AMBULATORY_CARE_PROVIDER_SITE_OTHER): Payer: Medicare Other | Admitting: Internal Medicine

## 2011-12-18 ENCOUNTER — Ambulatory Visit: Payer: Medicare Other | Admitting: Internal Medicine

## 2011-12-18 ENCOUNTER — Ambulatory Visit (INDEPENDENT_AMBULATORY_CARE_PROVIDER_SITE_OTHER)
Admission: RE | Admit: 2011-12-18 | Discharge: 2011-12-18 | Disposition: A | Discharge status: Home / Self Care | Payer: Medicare Other | Source: Ambulatory Visit | Attending: Internal Medicine | Admitting: Internal Medicine

## 2011-12-18 ENCOUNTER — Encounter: Payer: Self-pay | Admitting: Internal Medicine

## 2011-12-18 VITALS — BP 138/62 | Temp 97.6°F | Wt 165.0 lb

## 2011-12-18 DIAGNOSIS — M542 Cervicalgia: Secondary | ICD-10-CM

## 2011-12-18 MED ORDER — HYDROCODONE-ACETAMINOPHEN 5-500 MG PO TABS: 1.0000 | ORAL_TABLET | Freq: Three times a day (TID) | ORAL | Status: DC | PRN

## 2011-12-18 MED ORDER — METHYLPREDNISOLONE ACETATE 80 MG/ML IJ SUSP
80.0000 mg | Freq: Once | INTRAMUSCULAR | Status: AC
  Administered 2011-12-18: 80 mg via INTRAMUSCULAR

## 2011-12-18 NOTE — Progress Notes (Signed)
Subjective:    Patient ID: [name], female    DOB: April 05, 1931, 78 y.o.   MRN: [id number]  HPI  78 year old patient who is seen for persistent posterior neck pain. She was initially seen about 2 weeks ago with a one-day history of atraumatic neck pain;  pain was also involving  the right upper back region. She has been using muscle relaxers a soft cervical collar and alternating heat and ice with little benefit. Pain is aggravated by head turning. No radicular symptoms. No constitutional complaints and  Past Medical History  Diagnosis Date  . Diabetes mellitus   . Hypertension   . Fibrocystic breast disease   . Meningioma   . Tendon tear, ankle     History   Social History  . Marital Status: Widowed    Spouse Name: N/A    Number of Children: N/A  . Years of Education: N/A   Occupational History  . Not on file.   Social History Main Topics  . Smoking status: Never Smoker   . Smokeless tobacco: Not on file  . Alcohol Use: No  . Drug Use: No  . Sexually Active: No   Other Topics Concern  . Not on file   Social History Narrative  . No narrative on file    Past Surgical History  Procedure Date  . Breast lumpectomy   . Tonsillectomy   . Tendon tear   . Colonoscopy     05/25/2000. Results: Normal. Hemorrhoids    Family History  Problem Relation Age of Onset  . Cancer Mother   . Heart disease Father   . Stroke Father     Allergies  Allergen Reactions  . Sulfonamide Derivatives     REACTION: childhood unknown type    Current Outpatient Prescriptions on File Prior to Visit  Medication Sig Dispense Refill  . amLODipine-olmesartan (AZOR) 5-40 MG per tablet Take 1 tablet by mouth daily.  30 tablet  2  . aspirin 81 MG tablet Take 81 mg by mouth daily.        Marland Kitchen atenolol (TENORMIN) 100 MG tablet Take 0.5 tablets (50 mg total) by mouth daily.  30 tablet  4  . chlorzoxazone (PARAFON) 500 MG tablet TAKE 1 TABLET (500 MG TOTAL) BY MOUTH 3 (THREE) TIMES DAILY AS  NEEDED FOR MUSCLE SPASMS.  30 tablet  0  . Cholecalciferol (VITAMIN D) 400 UNITS capsule Take 400 Units by mouth daily.        . meclizine (ANTIVERT) 25 MG tablet Take 25 mg by mouth 3 (three) times daily as needed.        . Red Yeast Rice Extract 600 MG TABS Take by mouth daily.        . traMADol (ULTRAM) 50 MG tablet Take 1 tablet (50 mg total) by mouth every 6 (six) hours as needed for pain.  30 tablet  1    BP 138/62  Temp 97.6 F (36.4 C) (Oral)  Wt 165 lb (74.844 kg)    sa  Review of Systems  Constitutional: Negative.   HENT: Negative for hearing loss, congestion, sore throat, rhinorrhea, dental problem, sinus pressure and tinnitus.   Eyes: Negative for pain, discharge and visual disturbance.  Respiratory: Negative for cough and shortness of breath.   Cardiovascular: Negative for chest pain, palpitations and leg swelling.  Gastrointestinal: Negative for nausea, vomiting, abdominal pain, diarrhea, constipation, blood in stool and abdominal distention.  Genitourinary: Negative for dysuria, urgency, frequency, hematuria, flank pain, vaginal bleeding,  vaginal discharge, difficulty urinating, vaginal pain and pelvic pain.  Musculoskeletal: Negative for joint swelling, arthralgias (neck and right upper back pain) and gait problem.  Skin: Negative for rash.  Neurological: Negative for dizziness, syncope, speech difficulty, weakness, numbness and headaches.  Hematological: Negative for adenopathy.  Psychiatric/Behavioral: Negative for behavioral problems, dysphoric mood and agitation. The patient is not nervous/anxious.        Objective:   Physical Exam  Constitutional: She is oriented to person, place, and time. She appears well-developed and well-nourished. No distress.  HENT:  Mouth/Throat: Oropharynx is clear and moist. No oropharyngeal exudate.  Neck:       Range of motion of the neck appear to be intact but was slightly uncomfortable  Appear to be a small subcutaneous  nodule over the right posterior neck musculature in the mid neck region  Lymphadenopathy:    She has no cervical adenopathy.  Neurological: She is alert and oriented to person, place, and time. She has normal reflexes. No cranial nerve deficit.          Assessment & Plan:  Persistent neck pain now of 2 weeks duration. We'll continue present regimen which includes a soft cervical collar heat therapy and muscle relaxers. Will treat with Depo-Medrol 80 mg IM. A new prescription for Vicodin dispensed. Will reassess in 7 days unless there is dramatic clinical improvement. Radiographs of the neck will be obtained. The area of the right posterior neck will be reassessed next week

## 2011-12-18 NOTE — Telephone Encounter (Signed)
Caller: Dianne/Child; Patient Name: Chilton Si; PCP: Darryll Capers (Adults only); Best Callback Phone Number: [phone]            Unaware of any injury - Started Rt posterior neck and Right shoulder pain on Wednesday 10/2 and seen in office Thursday 10/3.  Diagnosed with Cervical Sprain, started Tramadol.  Has been applying ice and heat, resting.  Gets the best results with ice and rest but still a lot of pain.  Was crying earlier today 10/18 but pain a bit less now but never goes away.  Afraid to move head side to side or shoulder since not wanting to make the pain worse.  Feels the medication not helping very much.    Triaged in Neck Pain Guideline - Dispoisition:  See Provider Within 72 Hours due to Neck Pain radiates to shoulder or arm and has not responded to 48 hours of home care.  Wanting to be re-evaluated today.  APPT 3:00pm today Dr Fabian Sharp since Dr Lovell Sheehan out of the office.

## 2011-12-18 NOTE — Telephone Encounter (Signed)
Pt came in for ov to see Dr Amador Cunas on 12/18/11 and the doctor told pt to come in for 1 wk fup to see Dr Lovell Sheehan if not any better. Pls advise.

## 2011-12-18 NOTE — Patient Instructions (Addendum)
You  may move around, but avoid painful motions and activities.  Apply heat to the sore area for 15 to 20 minutes 3 or 4 times daily for the next two to 3 days.  Office followup 1 week if unimproved

## 2011-12-21 ENCOUNTER — Telehealth: Payer: Self-pay | Admitting: Internal Medicine

## 2011-12-21 NOTE — Telephone Encounter (Signed)
Caller: Zakirah/Patient; Patient Name: [name]; PCP: Darryll Capers (Adults only); Best Callback Phone Number: [phone].  Pt was calling to get the results of xray done on 12/18/11.  Per EPIC, Notes Recorded by Gordy Savers, MD on 12/21/2011 at 7:57 AM Please notify patient that x-ray revealed significant arthritic changes only.  Pt did want to know how often she can get the Depo Medrol Injection because that seemed to help her.  RN unsure of frequency.  OFFICE please follow up regarding medication frequency.

## 2011-12-21 NOTE — Telephone Encounter (Signed)
Lets wait and see if she is better- she may not need an appointment

## 2011-12-21 NOTE — Progress Notes (Signed)
Quick Note:  attmpt to call- no ans no mach - will try tomoorw ______

## 2011-12-21 NOTE — Progress Notes (Signed)
Quick Note:  Spoke with pt- informed if results , discussed pain meds ______

## 2011-12-22 NOTE — Telephone Encounter (Signed)
Per previous note and time frame ,[name] talked with pt and gave her results of cxr

## 2012-01-20 ENCOUNTER — Ambulatory Visit (INDEPENDENT_AMBULATORY_CARE_PROVIDER_SITE_OTHER): Payer: Medicare Other | Admitting: Internal Medicine

## 2012-01-20 VITALS — BP 140/80 | HR 72 | Temp 98.0°F | Resp 16 | Ht 65.0 in | Wt 171.0 lb

## 2012-01-20 DIAGNOSIS — I10 Essential (primary) hypertension: Secondary | ICD-10-CM

## 2012-01-20 DIAGNOSIS — E1165 Type 2 diabetes mellitus with hyperglycemia: Secondary | ICD-10-CM

## 2012-01-20 DIAGNOSIS — E1169 Type 2 diabetes mellitus with other specified complication: Secondary | ICD-10-CM

## 2012-01-20 DIAGNOSIS — IMO0002 Reserved for concepts with insufficient information to code with codable children: Secondary | ICD-10-CM

## 2012-01-20 DIAGNOSIS — Z0000 Encounter for general adult medical examination without abnormal findings: Secondary | ICD-10-CM

## 2012-01-20 LAB — BASIC METABOLIC PANEL
CO2: 33 mEq/L — ABNORMAL HIGH (ref 19–32)
Calcium: 9.5 mg/dL (ref 8.4–10.5)
Glucose, Bld: 87 mg/dL (ref 70–99)
Sodium: 140 mEq/L (ref 135–145)

## 2012-01-20 LAB — CBC WITH DIFFERENTIAL/PLATELET
Eosinophils Relative: 2.8 % (ref 0.0–5.0)
HCT: 41.5 % (ref 36.0–46.0)
Hemoglobin: 13.8 g/dL (ref 12.0–15.0)
Lymphs Abs: 1.8 10*3/uL (ref 0.7–4.0)
MCV: 84.1 fl (ref 78.0–100.0)
Monocytes Absolute: 0.4 10*3/uL (ref 0.1–1.0)
Monocytes Relative: 5.7 % (ref 3.0–12.0)
Neutro Abs: 4.6 10*3/uL (ref 1.4–7.7)
WBC: 7 10*3/uL (ref 4.5–10.5)

## 2012-01-20 LAB — POCT URINALYSIS DIPSTICK
Blood, UA: NEGATIVE
Glucose, UA: NEGATIVE
Ketones, UA: NEGATIVE
Protein, UA: NEGATIVE
Spec Grav, UA: 1.02

## 2012-01-20 LAB — HEPATIC FUNCTION PANEL
Albumin: 4 g/dL (ref 3.5–5.2)
Alkaline Phosphatase: 78 U/L (ref 39–117)
Total Protein: 6.8 g/dL (ref 6.0–8.3)

## 2012-01-20 LAB — HEMOGLOBIN A1C: Hgb A1c MFr Bld: 5.6 % (ref 4.6–6.5)

## 2012-01-20 LAB — LIPID PANEL
Cholesterol: 263 mg/dL — ABNORMAL HIGH (ref 0–200)
HDL: 41 mg/dL (ref >39.00)
VLDL: 35 mg/dL (ref 0.0–40.0)

## 2012-01-20 NOTE — Patient Instructions (Signed)
The patient is instructed to continue all medications as prescribed. Schedule followup with check out clerk upon leaving the clinic  

## 2012-02-01 ENCOUNTER — Telehealth: Payer: Self-pay | Admitting: Internal Medicine

## 2012-02-01 NOTE — Telephone Encounter (Signed)
Patient calls today requesting results of lab tests from 01/2012.  RN not sure if it has been reviewed by provider with some values being abnormal.  Please follow up with patient as is appropriate.  Thanks

## 2012-02-02 NOTE — Telephone Encounter (Signed)
Talked with pt -- all labs wnl except lipids which were better this time-pt informed

## 2012-02-27 ENCOUNTER — Other Ambulatory Visit: Payer: Self-pay | Admitting: Internal Medicine

## 2012-03-13 ENCOUNTER — Encounter: Payer: Self-pay | Admitting: Internal Medicine

## 2012-03-13 NOTE — Progress Notes (Signed)
Subjective:    Patient ID: [name], female    DOB: Dec 24, 1931, 79 y.o.   MRN: [id number]  HPI  [facility] medicare exam/ wellness Poor understanding of problems included in this is her DM , lipids and CAD risks   Review of Systems  Constitutional: Positive for fatigue. Negative for activity change and appetite change.  HENT: Negative for ear pain, congestion, neck pain, postnasal drip and sinus pressure.   Eyes: Negative for redness and visual disturbance.  Respiratory: Negative for cough, shortness of breath and wheezing.   Gastrointestinal: Negative for abdominal pain and abdominal distention.  Genitourinary: Negative for dysuria, frequency and menstrual problem.  Musculoskeletal: Positive for myalgias and joint swelling. Negative for arthralgias.  Skin: Negative for rash and wound.  Neurological: Negative for dizziness, weakness and headaches.  Hematological: Negative for adenopathy. Does not bruise/bleed easily.  Psychiatric/Behavioral: Positive for confusion. Negative for sleep disturbance and decreased concentration. The patient is nervous/anxious.    Past Medical History  Diagnosis Date  . Diabetes mellitus   . Hypertension   . Fibrocystic breast disease   . Meningioma   . Tendon tear, ankle     History   Social History  . Marital Status: Widowed    Spouse Name: N/A    Number of Children: N/A  . Years of Education: N/A   Occupational History  . Not on file.   Social History Main Topics  . Smoking status: Never Smoker   . Smokeless tobacco: Not on file  . Alcohol Use: No  . Drug Use: No  . Sexually Active: No   Other Topics Concern  . Not on file   Social History Narrative  . No narrative on file    Past Surgical History  Procedure Date  . Breast lumpectomy   . Tonsillectomy   . Tendon tear   . Colonoscopy     05/25/2000. Results: Normal. Hemorrhoids    Family History  Problem Relation Age of Onset  . Cancer Mother   . Heart disease Father    . Stroke Father     Allergies  Allergen Reactions  . Sulfonamide Derivatives     REACTION: childhood unknown type    Current Outpatient Prescriptions on File Prior to Visit  Medication Sig Dispense Refill  . amLODipine-olmesartan (AZOR) 5-40 MG per tablet Take 1 tablet by mouth daily.  30 tablet  2  . aspirin 81 MG tablet Take 81 mg by mouth daily.        Marland Kitchen atenolol (TENORMIN) 100 MG tablet Take 0.5 tablets (50 mg total) by mouth daily.  30 tablet  4  . chlorzoxazone (PARAFON) 500 MG tablet TAKE 1 TABLET (500 MG TOTAL) BY MOUTH 3 (THREE) TIMES DAILY AS NEEDED FOR MUSCLE SPASMS.  30 tablet  0  . Cholecalciferol (VITAMIN D) 400 UNITS capsule Take 400 Units by mouth daily.        Marland Kitchen HYDROcodone-acetaminophen (VICODIN) 5-500 MG per tablet Take 1 tablet by mouth every 8 (eight) hours as needed for pain.  60 tablet  0  . meclizine (ANTIVERT) 25 MG tablet Take 25 mg by mouth 3 (three) times daily as needed.        . Red Yeast Rice Extract 600 MG TABS Take by mouth daily.          BP 140/80  Pulse 72  Temp 98 F (36.7 C)  Resp 16  Ht 5\' 5"  (1.651 m)  Wt 171 lb (77.565 kg)  BMI 28.46 kg/m2  Objective:   Physical Exam  Nursing note and vitals reviewed. Constitutional: She is oriented to person, place, and time. She appears well-developed and well-nourished. No distress.  HENT:  Head: Normocephalic and atraumatic.  Eyes: Conjunctivae normal and EOM are normal. Pupils are equal, round, and reactive to light.  Neck: Normal range of motion. Neck supple. No JVD present. No tracheal deviation present. No thyromegaly present.  Cardiovascular: Normal rate, regular rhythm and intact distal pulses.   Murmur heard. Pulmonary/Chest: Effort normal and breath sounds normal. She has no wheezes. She exhibits no tenderness.  Abdominal: Soft. Bowel sounds are normal.  Musculoskeletal: Normal range of motion. She exhibits no edema and no tenderness.  Lymphadenopathy:    She has no cervical  adenopathy.  Neurological: She is alert and oriented to person, place, and time. She displays abnormal reflex. No cranial nerve deficit. Coordination abnormal.  Skin: Skin is warm and dry. She is not diaphoretic.  Psychiatric: She has a normal mood and affect. Her behavior is normal.          Assessment & Plan:   This is a routine physical examination for this healthy  Female. Reviewed all health maintenance protocols including mammography colonoscopy bone density and reviewed appropriate screening labs. Her immunization history was reviewed as well as her current medications and allergies refills of her chronic medications were given and the plan for yearly health maintenance was discussed all orders and referrals were made as appropriate.   Ordered lipids and  Will adjust intervention to get LDL below 100 HTN stable weigth loss needed DM diet reveiwed

## 2012-03-15 ENCOUNTER — Telehealth: Payer: Self-pay | Admitting: Internal Medicine

## 2012-03-15 NOTE — Telephone Encounter (Signed)
Patient calling to check blood sugar and HgA1C results on 01/20/2012.   Says changed BP meds about 2 years ago from Micardis to current meds due to cost. Now finding out cost of Micardis is $6-7 per Rx and wanting to go back to that medication because she really liked it and felt well while taking the Rx.. [facility] [address]. Does not have future Appt scheduled. Can reach patient at  [phone].

## 2012-03-16 ENCOUNTER — Other Ambulatory Visit: Payer: Self-pay | Admitting: *Deleted

## 2012-03-16 MED ORDER — TELMISARTAN-HCTZ 80-12.5 MG PO TABS: 1.0000 | ORAL_TABLET | Freq: Every day | ORAL | Status: DC

## 2012-03-24 ENCOUNTER — Telehealth: Payer: Self-pay | Admitting: Internal Medicine

## 2012-03-24 NOTE — Telephone Encounter (Signed)
Pt states she has started taking telmisartan-hydrochlorothiazide (MICARDIS HCT) .  Pt wants to know if she needs to continue to take the   atenolol (TENORMIN) 100 MG tablet (1/2 pill in the afternoon) Pt feels she needs to come and get blood pressure cked before she continues to take atenolol anyway. Pt is leaving at 9:45 am to go to [facility] in the am and wants to know if she should come get BP cked here after that?

## 2012-03-28 NOTE — Telephone Encounter (Signed)
Yes she should stay on both

## 2012-03-28 NOTE — Telephone Encounter (Signed)
Pt aware, scheduled appt with Padonda on 03/31/12

## 2012-03-30 ENCOUNTER — Ambulatory Visit (INDEPENDENT_AMBULATORY_CARE_PROVIDER_SITE_OTHER): Payer: Medicare Other | Admitting: Family

## 2012-03-30 ENCOUNTER — Encounter: Payer: Self-pay | Admitting: Family

## 2012-03-30 VITALS — BP 140/70 | HR 70 | Wt 177.0 lb

## 2012-03-30 DIAGNOSIS — R7309 Other abnormal glucose: Secondary | ICD-10-CM

## 2012-03-30 DIAGNOSIS — I10 Essential (primary) hypertension: Secondary | ICD-10-CM

## 2012-03-30 DIAGNOSIS — E7800 Pure hypercholesterolemia, unspecified: Secondary | ICD-10-CM

## 2012-03-30 DIAGNOSIS — R739 Hyperglycemia, unspecified: Secondary | ICD-10-CM

## 2012-03-30 NOTE — Patient Instructions (Addendum)
Hypercholesterolemia High Blood Cholesterol Cholesterol is a white, waxy, fat-like protein needed by your body in small amounts. The liver makes all the cholesterol you need. It is carried from the liver by the blood through the blood vessels. Deposits (plaque) may build up on blood vessel walls. This makes the arteries narrower and stiffer. Plaque increases the risk for heart attack and stroke. You cannot feel your cholesterol level even if it is very high. The only way to know is by a blood test to check your lipid (fats) levels. Once you know your cholesterol levels, you should keep a record of the test results. Work with your caregiver to to keep your levels in the desired range. WHAT THE RESULTS MEAN:  Total cholesterol is a rough measure of all the cholesterol in your blood.  LDL is the so-called bad cholesterol. This is the type that deposits cholesterol in the walls of the arteries. You want this level to be low.  HDL is the good cholesterol because it cleans the arteries and carries the LDL away. You want this level to be high.  Triglycerides are fat that the body can either burn for energy or store. High levels are closely linked to heart disease. DESIRED LEVELS:  Total cholesterol below 200.  LDL below 100 for people at risk, below 70 for very high risk.  HDL above 50 is good, above 60 is best.  Triglycerides below 150. HOW TO LOWER YOUR CHOLESTEROL:  Diet.  Choose fish or white meat chicken and [place], roasted or baked. Limit fatty cuts of red meat, fried foods, and processed meats, such as sausage and lunch meat.  Eat lots of fresh fruits and vegetables. Choose whole grains, beans, pasta, potatoes and cereals.  Use only small amounts of olive, corn or canola oils. Avoid butter, mayonnaise, shortening or palm kernel oils. Avoid foods with trans-fats.  Use skim/nonfat milk and low-fat/nonfat yogurt and cheeses. Avoid whole milk, cream, ice cream, egg yolks and cheeses.  Healthy desserts include angel food cake, gingersnaps, animal crackers, hard candy, popsicles, and low-fat/nonfat frozen yogurt. Avoid pastries, cakes, pies and cookies.  Exercise.  A regular program helps decrease LDL and raises HDL.  Helps with weight control.  Do things that increase your activity level like gardening, walking, or taking the stairs.  Medication.  May be prescribed by your caregiver to help lowering cholesterol and the risk for heart disease.  You may need medicine even if your levels are normal if you have several risk factors. HOME CARE INSTRUCTIONS   Follow your diet and exercise programs as suggested by your caregiver.  Take medications as directed.  Have blood work done when your caregiver feels it is necessary. MAKE SURE YOU:   Understand these instructions.  Will watch your condition.  Will get help right away if you are not doing well or get worse. Document Released: 02/16/2005 Document Revised: 05/11/2011 Document Reviewed: 08/04/2006 [facility] Patient Information 2013 [place], [place].   Fat and Cholesterol Control Diet Cholesterol levels in your body are determined significantly by your diet. Cholesterol levels may also be related to heart disease. The following material helps to explain this relationship and discusses what you can do to help keep your heart healthy. Not all cholesterol is bad. Low-density lipoprotein (LDL) cholesterol is the "bad" cholesterol. It may cause fatty deposits to build up inside your arteries. High-density lipoprotein (HDL) cholesterol is "good." It helps to remove the "bad" LDL cholesterol from your blood. Cholesterol is a very important risk  factor for heart disease. Other risk factors are high blood pressure, smoking, stress, heredity, and weight. The heart muscle gets its supply of blood through the coronary arteries. If your LDL cholesterol is high and your HDL cholesterol is low, you are at risk for having fatty  deposits build up in your coronary arteries. This leaves less room through which blood can flow. Without sufficient blood and oxygen, the heart muscle cannot function properly and you may feel chest pains (angina pectoris). When a coronary artery closes up entirely, a part of the heart muscle may die causing a heart attack (myocardial infarction). CHECKING CHOLESTEROL When your caregiver sends your blood to a lab to be examined for cholesterol, a complete lipid (fat) profile may be done. With this test, the total amount of cholesterol and levels of LDL and HDL are determined. Triglycerides are a type of fat that circulates in the blood. They can also be used to determine heart disease risk. The list below describes what the numbers should be: Test: Total Cholesterol.  Less than 200 mg/dl. Test: LDL "bad cholesterol."  Less than 100 mg/dl.  Less than 70 mg/dl if you are at very high risk of a heart attack or sudden cardiac death. Test: HDL "good cholesterol."  Greater than 50 mg/dl for women.  Greater than 40 mg/dl for men. Test: Triglycerides.  Less than 150 mg/dl. CONTROLLING CHOLESTEROL WITH DIET Although exercise and lifestyle factors are important, your diet is key. That is because certain foods are known to raise cholesterol and others to lower it. The goal is to balance foods for their effect on cholesterol and more importantly, to replace saturated and trans fat with other types of fat, such as monounsaturated fat, polyunsaturated fat, and omega-3 fatty acids. On average, a person should consume no more than 15 to 17 g of saturated fat daily. Saturated and trans fats are considered "bad" fats, and they will raise LDL cholesterol. Saturated fats are primarily found in animal products such as meats, butter, and cream. However, that does not mean you need to give up all your favorite foods. Today, there are good tasting, low-fat, low-cholesterol substitutes for most of the things you like to  eat. Choose low-fat or nonfat alternatives. Choose round or loin cuts of red meat. These types of cuts are lowest in fat and cholesterol. Chicken (without the skin), fish, veal, and ground [place] breast are great choices. Eliminate fatty meats, such as hot dogs and salami. Even shellfish have little or no saturated fat. Have a 3 oz (85 g) portion when you eat lean meat, poultry, or fish. Trans fats are also called "partially hydrogenated oils." They are oils that have been scientifically manipulated so that they are solid at room temperature resulting in a longer shelf life and improved taste and texture of foods in which they are added. Trans fats are found in stick margarine, some tub margarines, cookies, crackers, and baked goods.  When baking and cooking, oils are a great substitute for butter. The monounsaturated oils are especially beneficial since it is believed they lower LDL and raise HDL. The oils you should avoid entirely are saturated tropical oils, such as coconut and palm.  Remember to eat a lot from food groups that are naturally free of saturated and trans fat, including fish, fruit, vegetables, beans, grains (barley, rice, couscous, bulgur wheat), and pasta (without cream sauces).  IDENTIFYING FOODS THAT LOWER CHOLESTEROL  Soluble fiber may lower your cholesterol. This type of fiber is found in  fruits such as apples, vegetables such as broccoli, potatoes, and carrots, legumes such as beans, peas, and lentils, and grains such as barley. Foods fortified with plant sterols (phytosterol) may also lower cholesterol. You should eat at least 2 g per day of these foods for a cholesterol lowering effect.  Read package labels to identify low-saturated fats, trans fat free, and low-fat foods at the supermarket. Select cheeses that have only 2 to 3 g saturated fat per ounce. Use a heart-healthy tub margarine that is free of trans fats or partially hydrogenated oil. When buying baked goods (cookies,  crackers), avoid partially hydrogenated oils. Breads and muffins should be made from whole grains (whole-wheat or whole oat flour, instead of "flour" or "enriched flour"). Buy non-creamy canned soups with reduced salt and no added fats.  FOOD PREPARATION TECHNIQUES  Never deep-fry. If you must fry, either stir-fry, which uses very little fat, or use non-stick cooking sprays. When possible, broil, bake, or roast meats, and steam vegetables. Instead of putting butter or margarine on vegetables, use lemon and herbs, applesauce, and cinnamon (for squash and sweet potatoes), nonfat yogurt, salsa, and low-fat dressings for salads.  LOW-SATURATED FAT / LOW-FAT FOOD SUBSTITUTES Meats / Saturated Fat (g)  Avoid: Steak, marbled (3 oz/85 g) / 11 g  Choose: Steak, lean (3 oz/85 g) / 4 g  Avoid: Hamburger (3 oz/85 g) / 7 g  Choose: Hamburger, lean (3 oz/85 g) / 5 g  Avoid: Ham (3 oz/85 g) / 6 g  Choose: Ham, lean cut (3 oz/85 g) / 2.4 g  Avoid: Chicken, with skin, dark meat (3 oz/85 g) / 4 g  Choose: Chicken, skin removed, dark meat (3 oz/85 g) / 2 g  Avoid: Chicken, with skin, light meat (3 oz/85 g) / 2.5 g  Choose: Chicken, skin removed, light meat (3 oz/85 g) / 1 g Dairy / Saturated Fat (g)  Avoid: Whole milk (1 cup) / 5 g  Choose: Low-fat milk, 2% (1 cup) / 3 g  Choose: Low-fat milk, 1% (1 cup) / 1.5 g  Choose: Skim milk (1 cup) / 0.3 g  Avoid: Hard cheese (1 oz/28 g) / 6 g  Choose: Skim milk cheese (1 oz/28 g) / 2 to 3 g  Avoid: Cottage cheese, 4% fat (1 cup) / 6.5 g  Choose: Low-fat cottage cheese, 1% fat (1 cup) / 1.5 g  Avoid: Ice cream (1 cup) / 9 g  Choose: Sherbet (1 cup) / 2.5 g  Choose: Nonfat frozen yogurt (1 cup) / 0.3 g  Choose: Frozen fruit bar / trace  Avoid: Whipped cream (1 tbs) / 3.5 g  Choose: Nondairy whipped topping (1 tbs) / 1 g Condiments / Saturated Fat (g)  Avoid: Mayonnaise (1 tbs) / 2 g  Choose: Low-fat mayonnaise (1 tbs) / 1 g  Avoid:  Butter (1 tbs) / 7 g  Choose: Extra light margarine (1 tbs) / 1 g  Avoid: Coconut oil (1 tbs) / 11.8 g  Choose: Olive oil (1 tbs) / 1.8 g  Choose: Corn oil (1 tbs) / 1.7 g  Choose: Safflower oil (1 tbs) / 1.2 g  Choose: Sunflower oil (1 tbs) / 1.4 g  Choose: Soybean oil (1 tbs) / 2.4 g  Choose: Canola oil (1 tbs) / 1 g Document Released: 02/16/2005 Document Revised: 05/11/2011 Document Reviewed: 08/07/2010 [facility] Patient Information 2013 [place], [place].

## 2012-03-30 NOTE — Progress Notes (Signed)
Subjective:    Patient ID: [name], female    DOB: September 09, 1931, 79 y.o.   MRN: [id number]  HPI 79 year old white female, nonsmoker, patient of Dr. Lovell Sheehan is in today for recheck of hypertension and hyperlipidemia. [facility] She takes red yeast rice to try to control her cholesterol. In the past, she's declined statin medications. Her current LDL is 191. She's also had a history of hyperglycemia that is stable. Hemoglobin A1c 5.6. [facility]. reports readings in the 120s systolically. She's requesting to have her medication dosage decreased simply because she does not like taking a lot of medications.   Review of Systems  Constitutional: Negative.   HENT: Negative.   Respiratory: Negative.   Cardiovascular: Negative.   Gastrointestinal: Negative.   Musculoskeletal: Negative.   Skin: Negative.   Neurological: Negative.   Hematological: Negative.   Psychiatric/Behavioral: Negative.    Past Medical History  Diagnosis Date  . Diabetes mellitus   . Hypertension   . Fibrocystic breast disease   . Meningioma   . Tendon tear, ankle     History   Social History  . Marital Status: Widowed    Spouse Name: N/A    Number of Children: N/A  . Years of Education: N/A   Occupational History  . Not on file.   Social History Main Topics  . Smoking status: Never Smoker   . Smokeless tobacco: Not on file  . Alcohol Use: No  . Drug Use: No  . Sexually Active: No   Other Topics Concern  . Not on file   Social History Narrative  . No narrative on file    Past Surgical History  Procedure Date  . Breast lumpectomy   . Tonsillectomy   . Tendon tear   . Colonoscopy     05/25/2000. Results: Normal. Hemorrhoids    Family History  Problem Relation Age of Onset  . Cancer Mother   . Heart disease Father   . Stroke Father     Allergies  Allergen Reactions  . Sulfonamide Derivatives       REACTION: childhood unknown type    Current Outpatient Prescriptions on File Prior to Visit  Medication Sig Dispense Refill  . aspirin 81 MG tablet Take 81 mg by mouth daily.        Marland Kitchen atenolol (TENORMIN) 100 MG tablet Take 0.5 tablets (50 mg total) by mouth daily.  30 tablet  4  . atenolol (TENORMIN) 100 MG tablet TAKE 1 TABLET BY MOUTH ONCE DAILY  30 tablet  4  . chlorzoxazone (PARAFON) 500 MG tablet TAKE 1 TABLET (500 MG TOTAL) BY MOUTH 3 (THREE) TIMES DAILY AS NEEDED FOR MUSCLE SPASMS.  30 tablet  0  . Cholecalciferol (VITAMIN D) 400 UNITS capsule Take 400 Units by mouth daily.        Marland Kitchen HYDROcodone-acetaminophen (VICODIN) 5-500 MG per tablet Take 1 tablet by mouth every 8 (eight) hours as needed for pain.  60 tablet  0  . meclizine (ANTIVERT) 25 MG tablet Take 25 mg by mouth 3 (three) times daily as needed.        . Red Yeast Rice Extract 600 MG TABS Take by mouth daily.        Marland Kitchen telmisartan-hydrochlorothiazide (MICARDIS HCT) 80-12.5 MG per tablet Take 1 tablet by mouth daily.  30 tablet  6  . telmisartan-hydrochlorothiazide (MICARDIS HCT) 80-12.5 MG per tablet Take  1 tablet by mouth daily.  30 tablet  6    BP 140/70  Pulse 70  Wt 177 lb (80.287 kg)  SpO2 98%chart    Objective:   Physical Exam  Constitutional: She is oriented to person, place, and time. She appears well-developed and well-nourished.  HENT:  Right Ear: External ear normal.  Left Ear: External ear normal.  Nose: Nose normal.  Mouth/Throat: Oropharynx is clear and moist.  Neck: Normal range of motion. Neck supple.  Cardiovascular: Normal rate, regular rhythm and normal heart sounds.   Pulmonary/Chest: Effort normal and breath sounds normal.  Neurological: She is alert and oriented to person, place, and time.  Skin: Skin is warm and dry.  Psychiatric: She has a normal mood and affect.          Assessment & Plan:  Assessment:  1. Hypertension 2. Hyperlipidemia 3. Hyperglycemia  Plan: We'll continue  all medications at their current dosages. Continue to exercise. Offered a statin medication again today, patient declines. Dietary information on hyperlipidemia provided for the patient. Recheck in 3-4 months to reassess her cholesterol and desire for medication at that time.

## 2012-03-31 ENCOUNTER — Ambulatory Visit: Payer: Medicare Other | Admitting: Family

## 2012-05-20 ENCOUNTER — Telehealth: Payer: Self-pay | Admitting: Internal Medicine

## 2012-05-20 NOTE — Telephone Encounter (Signed)
Patient Information:  Caller Name: [name]  Phone: [phone]  Patient: [name]  Gender: Female  DOB: 1931-08-04  Age: 79 Years  PCP: Darryll Capers (Adults only)  Office Follow Up:  Does the office need to follow up with this patient?: Yes  Instructions For The Office: [name] requesting Micardis HCT be discontinued- would like to take Losartan without HCTZ or Azor without HCTZ. States HCTZ makes her urinate more frequently but less amount of urine each time.   Symptoms  Reason For Call & Symptoms: [name] states she is taking a medication with HCTZ.  Currently takes Micardis HCT 80 - 12.5mg  one tab daily .Would like to switch to Losartan without HCTZ  or Azor with HCTZ. States the HCTZ is causing her to urinate more frequently but less amount at each time. No discomfort when urinating. Uses [facility] [address] [phone]. Doxie was unable to verify her current medications, allergies and health history. Questionable confusion? [name] AT [phone]  Reviewed Health History In EMR: Yes  Reviewed Medications In EMR: Yes  Reviewed Allergies In EMR: Yes  Reviewed Surgeries / Procedures: Yes  Date of Onset of Symptoms: 04/08/2012  Guideline(s) Used:  No Protocol Available - Information Only  Disposition Per Guideline:   Discuss with PCP and Callback by Nurse Today  Reason For Disposition Reached:   Nursing judgment  Advice Given:  Call Back If:  New symptoms develop  You become worse.  Patient Will Follow Care Advice:  YES

## 2012-05-20 NOTE — Telephone Encounter (Signed)
Pt informed she needs to stay on micardis and that is a low dose of hctz-pt will stay on that

## 2012-07-15 ENCOUNTER — Ambulatory Visit (INDEPENDENT_AMBULATORY_CARE_PROVIDER_SITE_OTHER): Payer: Medicare Other | Admitting: Internal Medicine

## 2012-07-15 ENCOUNTER — Encounter: Payer: Self-pay | Admitting: Internal Medicine

## 2012-07-15 VITALS — BP 140/74 | HR 72 | Temp 98.2°F | Resp 16 | Ht 65.0 in | Wt 182.0 lb

## 2012-07-15 DIAGNOSIS — I10 Essential (primary) hypertension: Secondary | ICD-10-CM

## 2012-07-15 DIAGNOSIS — E785 Hyperlipidemia, unspecified: Secondary | ICD-10-CM

## 2012-07-15 LAB — BASIC METABOLIC PANEL
BUN: 13 mg/dL (ref 6–23)
CO2: 32 mEq/L (ref 19–32)
Chloride: 100 mEq/L (ref 96–112)
Glucose, Bld: 161 mg/dL — ABNORMAL HIGH (ref 70–99)
Potassium: 3.9 mEq/L (ref 3.5–5.1)

## 2012-07-15 NOTE — Progress Notes (Signed)
Subjective:    Patient ID: [name], female    DOB: 24-Jan-1932, 79 y.o.   MRN: [id number]  HPI  Follow up for HTN on micardis Stable and needs to have renal function checked  Dizzy episodes or unsteady gate episodes that last for a few minutues No Headaches or palpitations associated   Review of Systems  Constitutional: Negative for activity change, appetite change and fatigue.  HENT: Negative for ear pain, congestion, neck pain, postnasal drip and sinus pressure.   Eyes: Negative for redness and visual disturbance.  Respiratory: Negative for cough, shortness of breath and wheezing.   Gastrointestinal: Negative for abdominal pain and abdominal distention.  Genitourinary: Negative for dysuria, frequency and menstrual problem.  Musculoskeletal: Negative for myalgias, joint swelling and arthralgias.  Skin: Negative for rash and wound.  Neurological: Negative for dizziness, weakness and headaches.  Hematological: Negative for adenopathy. Does not bruise/bleed easily.  Psychiatric/Behavioral: Negative for sleep disturbance and decreased concentration.   Past Medical History  Diagnosis Date  . Diabetes mellitus   . Hypertension   . Fibrocystic breast disease   . Meningioma   . Tendon tear, ankle     History   Social History  . Marital Status: Widowed    Spouse Name: N/A    Number of Children: N/A  . Years of Education: N/A   Occupational History  . Not on file.   Social History Main Topics  . Smoking status: Never Smoker   . Smokeless tobacco: Not on file  . Alcohol Use: No  . Drug Use: No  . Sexually Active: No   Other Topics Concern  . Not on file   Social History Narrative  . No narrative on file    Past Surgical History  Procedure Laterality Date  . Breast lumpectomy    . Tonsillectomy    . Tendon tear    . Colonoscopy      05/25/2000. Results: Normal. Hemorrhoids    Family History  Problem Relation Age of Onset  . Cancer Mother   . Heart  disease Father   . Stroke Father     Allergies  Allergen Reactions  . Sulfonamide Derivatives     REACTION: childhood unknown type    Current Outpatient Prescriptions on File Prior to Visit  Medication Sig Dispense Refill  . aspirin 81 MG tablet Take 81 mg by mouth daily.        Marland Kitchen atenolol (TENORMIN) 100 MG tablet Take 0.5 tablets (50 mg total) by mouth daily.  30 tablet  4  . Cholecalciferol (VITAMIN D) 400 UNITS capsule Take 400 Units by mouth daily.        . meclizine (ANTIVERT) 25 MG tablet Take 25 mg by mouth 3 (three) times daily as needed.        Marland Kitchen telmisartan-hydrochlorothiazide (MICARDIS HCT) 80-12.5 MG per tablet Take 1 tablet by mouth daily.  30 tablet  6   No current facility-administered medications on file prior to visit.    BP 140/74  Pulse 72  Temp(Src) 98.2 F (36.8 C)  Resp 16  Ht 5\' 5"  (1.651 m)  Wt 182 lb (82.555 kg)  BMI 30.29 kg/m2        Objective:   Physical Exam  Nursing note and vitals reviewed. Constitutional: She is oriented to person, place, and time. She appears well-developed and well-nourished. No distress.  HENT:  Head: Normocephalic and atraumatic.  Eyes: Conjunctivae and EOM are normal. Pupils are equal, round, and reactive to light.  Neck: Normal range of motion. Neck supple. No JVD present. No tracheal deviation present. No thyromegaly present.  Cardiovascular: Normal rate and regular rhythm.   Murmur heard. Pulmonary/Chest: Effort normal and breath sounds normal. She has no wheezes. She exhibits no tenderness.  Abdominal: Soft. Bowel sounds are normal.  Musculoskeletal: Normal range of motion. She exhibits no edema and no tenderness.  Lymphadenopathy:    She has no cervical adenopathy.  Neurological: She is alert and oriented to person, place, and time. She has normal reflexes. No cranial nerve deficit.  Skin: She is not diaphoretic.          Assessment & Plan:  HTN stable on micardis Needs to be consistent with use and  exercise No current chest pain or SOB Occasional dizzyness

## 2012-07-15 NOTE — Patient Instructions (Signed)
The patient is instructed to continue all medications as prescribed. Schedule followup with check out clerk upon leaving the clinic  

## 2012-07-22 ENCOUNTER — Telehealth: Payer: Self-pay | Admitting: Internal Medicine

## 2012-07-22 NOTE — Telephone Encounter (Signed)
Ok except elevated bs

## 2012-07-22 NOTE — Telephone Encounter (Signed)
Pt called to inquire about lab results from 07/15/12. Please assist.

## 2012-09-09 ENCOUNTER — Encounter (HOSPITAL_COMMUNITY): Payer: Self-pay | Admitting: *Deleted

## 2012-09-09 ENCOUNTER — Emergency Department (HOSPITAL_COMMUNITY)
Admission: EM | Admit: 2012-09-09 | Discharge: 2012-09-09 | Discharge status: Against Medical Advice | Payer: Medicare Other | Source: Home / Self Care | Attending: Emergency Medicine | Admitting: Emergency Medicine

## 2012-09-09 ENCOUNTER — Telehealth: Payer: Self-pay | Admitting: Internal Medicine

## 2012-09-09 DIAGNOSIS — R002 Palpitations: Secondary | ICD-10-CM

## 2012-09-09 DIAGNOSIS — R9431 Abnormal electrocardiogram [ECG] [EKG]: Secondary | ICD-10-CM

## 2012-09-09 NOTE — ED Notes (Signed)
Called Dr. Lovell Sheehan office RN told her to come here.  C/o heart beating hard when she woke up this morning 30- 60 minutes.  She took her BP medicine and it went away.  No SOB, nausea or sweating.  Never had this before.  Has felt something for 2 beats once in a while after she takes a pill, but never had it this long.  No symptoms now.  States she does not feel well.

## 2012-09-09 NOTE — Telephone Encounter (Signed)
Caller Name: [name]  Phone: [phone]  Patient: [name]  Gender: Female  DOB: [birth year]  Age: 79 Years  PCP: [name] (Adults only)   Does the office need to follow up with this patient?: No  RN Note: [facility], checked BP 135/87. At home BP monitor at home: HR 82 ( but varies so doesn't think is accuarate). UC advised due to no appt being available in the office this afternoon.   Reason For Call & Symptoms: noted 0660 heart beating fast, lasted 30-60 minutes, took her meds.  Reviewed Health History In EMR: Yes  Reviewed Medications In EMR: Yes  Reviewed Allergies In EMR: Yes  Reviewed Surgeries / Procedures: Yes  Date of Onset of Symptoms: 09/09/2012  Guideline(s) Used:  Heart Rate and Heartbeat Questions  Disposition Per Guideline:   See Today in Office  Reason For Disposition Reached:  Heart beating very rapidly and not present now  Advice Given:  Call Back If:  Chest pain, lightheadedness, or difficulty breathing occurs  You become worse.  Patient Will Follow Care Advice:  YES

## 2012-09-09 NOTE — ED Notes (Signed)
Pt. refused to go to the ED.  States " I believe I will be all right.".  Pt. signed AMA form.  Pt. instructed to call the doctor on call and let him know she did not go to the ED and f/u in office next week.  Pt. instructed to go to ED immediately if any further symptoms.  Pt. Voiced understanding.

## 2012-09-09 NOTE — ED Provider Notes (Signed)
History    CSN: [id number] Arrival date & time 09/09/12  1612  First MD Initiated Contact with Patient 09/09/12 1706     Chief Complaint  Patient presents with  . Palpitations   (Consider location/radiation/quality/duration/timing/severity/associated sxs/prior Treatment) HPI Comments: Patient presents this afternoon to urgent care describing that early this morning she experienced a "palpitations", described as if her heart was beating fast. She did not take her pulse and weighted for it to be gone which you did in about 35 minutes or less. She described that along with a sensation that she did not experience any further symptoms such as shortness of breath, feeling dizzy or presyncopal chest tightness or headache. She also denied upon questioning she experienced any further symptoms such as severe headache, numbness or tingling sensation or weakness to any of her upper or lower extremities.   She decided to come in to be checked just in case but she's actually at this point not having any symptoms.  She shares and hypothesized that yesterday as she was in the hospital accompanying a relative that underwent shoulder surgery she drank too much coffee and even tea which is not usual for her and she is considering that that might have been the cause.  Patient is also described that she has a change " in her taste", as at this point she is only able to taste sweets and salt and no other flavor he is uncertain of how long she had been feeling this.  Patient is a 79 y.o. female presenting with palpitations. The history is provided by the patient.  Palpitations Palpitations quality:  Fast Onset quality:  Sudden Progression:  Resolved Chronicity:  New Context: caffeine   Context: not anxiety, not illicit drugs and not nicotine   Relieved by:  Bed rest Worsened by:  Nothing tried Ineffective treatments:  None tried Associated symptoms: no chest pain, no chest pressure, no diaphoresis, no  dizziness, no lower extremity edema, no malaise/fatigue, no nausea, no near-syncope, no numbness, no shortness of breath, no vomiting and no weakness   Risk factors: no hx of atrial fibrillation, no hx of DVT and no hypercoagulable state    Past Medical History  Diagnosis Date  . Diabetes mellitus   . Hypertension   . Fibrocystic breast disease   . Meningioma   . Tendon tear, ankle    Past Surgical History  Procedure Laterality Date  . Breast lumpectomy    . Tonsillectomy    . Tendon tear    . Colonoscopy      05/25/2000. Results: Normal. Hemorrhoids   Family History  Problem Relation Age of Onset  . Cancer Mother   . Heart disease Father   . Stroke Father    History  Substance Use Topics  . Smoking status: Never Smoker   . Smokeless tobacco: Not on file  . Alcohol Use: No   OB History   Grav Para Term Preterm Abortions TAB SAB Ect Mult Living                 Review of Systems  Constitutional: Negative for malaise/fatigue, diaphoresis and activity change.  HENT: Negative for hearing loss, facial swelling, neck pain and ear discharge.   Respiratory: Negative for shortness of breath.   Cardiovascular: Positive for palpitations. Negative for chest pain, leg swelling and near-syncope.  Gastrointestinal: Negative for nausea and vomiting.  Musculoskeletal: Negative for arthralgias and gait problem.  Skin: Negative for pallor and rash.  Neurological: Negative for dizziness  and numbness.    Allergies  Sulfonamide derivatives  Home Medications   Current Outpatient Rx  Name  Route  Sig  Dispense  Refill  . aspirin 81 MG tablet   Oral   Take 81 mg by mouth daily.           Marland Kitchen atenolol (TENORMIN) 100 MG tablet   Oral   Take 0.5 tablets (50 mg total) by mouth daily.   30 tablet   4   . Cholecalciferol (VITAMIN D) 400 UNITS capsule   Oral   Take 400 Units by mouth daily.           Marland Kitchen Cod Liver Oil CAPS   Oral   Take by mouth.         . Misc Natural  Products (ECHINACEA COMPOUND PO)   Oral   Take by mouth.         . telmisartan-hydrochlorothiazide (MICARDIS HCT) 80-12.5 MG per tablet   Oral   Take 1 tablet by mouth daily.   30 tablet   6   . meclizine (ANTIVERT) 25 MG tablet   Oral   Take 25 mg by mouth 3 (three) times daily as needed.            BP 133/57  Pulse 70  Temp(Src) 98.4 F (36.9 C) (Oral)  Resp 16  SpO2 98% Physical Exam  Vitals reviewed. Constitutional: She is oriented to person, place, and time. Vital signs are normal. She appears well-developed and well-nourished.  Non-toxic appearance. She does not have a sickly appearance. She does not appear ill. No distress.  HENT:  Head: Normocephalic.  Neck: No JVD present.  Cardiovascular: Normal rate and regular rhythm.  Exam reveals no gallop and no friction rub.   No murmur heard. Pulmonary/Chest: Effort normal and breath sounds normal. She has no decreased breath sounds. She has no wheezes. She has no rhonchi.  Neurological: She is alert and oriented to person, place, and time.  Skin: No erythema.    ED Course  Procedures (including critical care time) Labs Reviewed - No data to display No results found. 1. Palpitations   2. Abnormal EKG    EKG normal sinus rhythm. Ventricular rate of 66 beats per minute. Low voltage QRS. EKG in comparison with EKG 2012-current EKG did demonstrate it T wave flattening in both V5 and V6 ( differing from 2012) MDM   Subjective palpitations, Patient with vague symptomatology but also includes changes in her taste and feeling somewhat weak During evaluation no neurological deficits of motor or sensorial function were noted. Patient does have a EKG change from baseline EKG 2012.  Patient will be transferred to the emergency department for further monitoring and observation.   Jimmie Molly, MD 09/09/12 [phone]

## 2012-09-09 NOTE — Telephone Encounter (Signed)
noted 

## 2012-09-12 ENCOUNTER — Telehealth: Payer: Self-pay | Admitting: Internal Medicine

## 2012-09-12 NOTE — Telephone Encounter (Signed)
PT called and stated that she was seen in the urgent care on Friday 09/09/12. She would like to be seen by Dr. Lovell Sheehan today in order to follow-up. She states that she is feeling weak today, and would like to see what her "pcp had to say about it". Please assist.

## 2012-09-12 NOTE — Telephone Encounter (Signed)
Ov tomorrow 

## 2012-09-13 ENCOUNTER — Encounter: Payer: Self-pay | Admitting: Internal Medicine

## 2012-09-13 ENCOUNTER — Ambulatory Visit (INDEPENDENT_AMBULATORY_CARE_PROVIDER_SITE_OTHER): Payer: Medicare Other | Admitting: Internal Medicine

## 2012-09-13 VITALS — BP 150/76 | HR 67 | Temp 97.9°F | Resp 20 | Wt 183.0 lb

## 2012-09-13 DIAGNOSIS — R7309 Other abnormal glucose: Secondary | ICD-10-CM

## 2012-09-13 DIAGNOSIS — E785 Hyperlipidemia, unspecified: Secondary | ICD-10-CM

## 2012-09-13 DIAGNOSIS — I10 Essential (primary) hypertension: Secondary | ICD-10-CM

## 2012-09-13 DIAGNOSIS — R9431 Abnormal electrocardiogram [ECG] [EKG]: Secondary | ICD-10-CM

## 2012-09-13 NOTE — Progress Notes (Signed)
Subjective:    Patient ID: Hale Drone, female    DOB: 03/21/1931, 79 y.o.   MRN: [id number]  HPI   79 year old patient who is seen today in followup. On July 11 she awoke with rapid forceful palpitations that lasted approximately 45 minutes. No associated symptoms she states that this slowly improved over 45 minutes. Later that afternoon she was evaluated at an urgent care where EKG revealed some inferolateral ST-T wave changes that were more prominent compared to a prior EKG from July 2012. She has had no recurrent palpitations or other symptoms. She denies any history of exertional chest pain shortness of breath or fatigue. She does participate in Silver sneakers program 2-3 times per week without difficulty. She also states that the day prior to her palpitations she was visiting at a hospital and consumed a large amount of both coffee and iced tea  Past Medical History  Diagnosis Date  . Diabetes mellitus   . Hypertension   . Fibrocystic breast disease   . Meningioma   . Tendon tear, ankle     History   Social History  . Marital Status: Widowed    Spouse Name: N/A    Number of Children: N/A  . Years of Education: N/A   Occupational History  . Not on file.   Social History Main Topics  . Smoking status: Never Smoker   . Smokeless tobacco: Not on file  . Alcohol Use: No  . Drug Use: No  . Sexually Active: No   Other Topics Concern  . Not on file   Social History Narrative  . No narrative on file    Past Surgical History  Procedure Laterality Date  . Breast lumpectomy    . Tonsillectomy    . Tendon tear    . Colonoscopy      05/25/2000. Results: Normal. Hemorrhoids    Family History  Problem Relation Age of Onset  . Cancer Mother   . Heart disease Father   . Stroke Father     Allergies  Allergen Reactions  . Sulfonamide Derivatives     REACTION: childhood unknown type    Current Outpatient Prescriptions on File Prior to Visit  Medication Sig  Dispense Refill  . aspirin 81 MG tablet Take 81 mg by mouth daily.        Marland Kitchen atenolol (TENORMIN) 100 MG tablet Take 0.5 tablets (50 mg total) by mouth daily.  30 tablet  4  . telmisartan-hydrochlorothiazide (MICARDIS HCT) 80-12.5 MG per tablet Take 1 tablet by mouth daily.  30 tablet  6   No current facility-administered medications on file prior to visit.    BP 150/76  Pulse 67  Temp(Src) 97.9 F (36.6 C) (Oral)  Resp 20  Wt 183 lb (83.008 kg)  BMI 30.45 kg/m2  SpO2 95%       Review of Systems  Constitutional: Positive for fatigue.  HENT: Negative for hearing loss, congestion, sore throat, rhinorrhea, dental problem, sinus pressure and tinnitus.   Eyes: Negative for pain, discharge and visual disturbance.  Respiratory: Negative for cough and shortness of breath.   Cardiovascular: Positive for palpitations. Negative for chest pain and leg swelling.  Gastrointestinal: Negative for nausea, vomiting, abdominal pain, diarrhea, constipation, blood in stool and abdominal distention.  Genitourinary: Negative for dysuria, urgency, frequency, hematuria, flank pain, vaginal bleeding, vaginal discharge, difficulty urinating, vaginal pain and pelvic pain.  Musculoskeletal: Negative for joint swelling, arthralgias and gait problem.  Skin: Negative for rash.  Neurological: Negative  for dizziness, syncope, speech difficulty, weakness, numbness and headaches.  Hematological: Negative for adenopathy.  Psychiatric/Behavioral: Negative for behavioral problems, dysphoric mood and agitation. The patient is not nervous/anxious.        Objective:   Physical Exam  Constitutional: She is oriented to person, place, and time. She appears well-developed and well-nourished.  HENT:  Head: Normocephalic.  Right Ear: External ear normal.  Left Ear: External ear normal.  Mouth/Throat: Oropharynx is clear and moist.  Eyes: Conjunctivae and EOM are normal. Pupils are equal, round, and reactive to light.   Neck: Normal range of motion. Neck supple. No thyromegaly present.  Cardiovascular: Normal rate, regular rhythm, normal heart sounds and intact distal pulses.   Grade 2/6 systolic murmur at the primary aortic area  Pulmonary/Chest: Effort normal and breath sounds normal.  Abdominal: Soft. Bowel sounds are normal. She exhibits no mass. There is no tenderness.  Musculoskeletal: Normal range of motion.  Lymphadenopathy:    She has no cervical adenopathy.  Neurological: She is alert and oriented to person, place, and time.  Skin: Skin is warm and dry. No rash noted.  Psychiatric: She has a normal mood and affect. Her behavior is normal.          Assessment & Plan:   History of palpitations in the setting of excessive caffeine use. Patient has been symptom-free for the past 4 days. EKG does reveal more prominent inferolateral ST-T wave changes compared to 2012. Options were discussed including observation, further evaluation with stress echo or nuclear stress test or cardiology referral.  She wishes to moderate her caffeine use and be followed clinically. She report any clinical change

## 2012-09-13 NOTE — Patient Instructions (Signed)
Limit your sodium (Salt) intake  Call or return to clinic prn if these symptoms worsen or fail to improve as anticipated.   

## 2012-11-09 ENCOUNTER — Other Ambulatory Visit: Payer: Self-pay | Admitting: Internal Medicine

## 2012-12-07 ENCOUNTER — Other Ambulatory Visit: Payer: Self-pay | Admitting: Internal Medicine

## 2012-12-30 ENCOUNTER — Telehealth: Payer: Self-pay | Admitting: Internal Medicine

## 2012-12-30 NOTE — Telephone Encounter (Signed)
Pt is on generic micardis 80-12.5mg . Pt would like to switch to benicar ?mg. [facility] [address]. Pt is looking to switch ins to [employer] in jan 2015. Please advice. Pt does want benicar call into pharm now. Pt just want to know if MD will switch her.

## 2012-12-30 NOTE — Telephone Encounter (Signed)
Told pt that if she changes insurances , get a list of their bp meds and he will decided the best one for her

## 2013-03-24 ENCOUNTER — Other Ambulatory Visit: Payer: Self-pay | Admitting: Internal Medicine

## 2013-03-24 ENCOUNTER — Telehealth: Payer: Self-pay | Admitting: Internal Medicine

## 2013-03-24 NOTE — Telephone Encounter (Signed)
Per pt telmisartan-hydrochlorothiazide (MICARDIS HCT) 80-12.5 MG per tablet isn't covered.  Pt knows Benicar is one of the medication on the list. Pt will call back with the remaining medication on the list.

## 2013-03-27 ENCOUNTER — Other Ambulatory Visit: Payer: Self-pay | Admitting: *Deleted

## 2013-03-27 MED ORDER — OLMESARTAN MEDOXOMIL-HCTZ 40-12.5 MG PO TABS: 1.0000 | ORAL_TABLET | Freq: Every day | ORAL | Status: DC

## 2013-04-05 ENCOUNTER — Ambulatory Visit (INDEPENDENT_AMBULATORY_CARE_PROVIDER_SITE_OTHER): Payer: Medicare HMO | Admitting: Internal Medicine

## 2013-04-05 ENCOUNTER — Encounter: Payer: Self-pay | Admitting: Internal Medicine

## 2013-04-05 VITALS — BP 140/80 | HR 76 | Temp 98.2°F | Resp 16 | Ht 65.0 in | Wt 186.0 lb

## 2013-04-05 DIAGNOSIS — M810 Age-related osteoporosis without current pathological fracture: Secondary | ICD-10-CM

## 2013-04-05 DIAGNOSIS — I10 Essential (primary) hypertension: Secondary | ICD-10-CM

## 2013-04-05 DIAGNOSIS — H9190 Unspecified hearing loss, unspecified ear: Secondary | ICD-10-CM

## 2013-04-05 DIAGNOSIS — Z0000 Encounter for general adult medical examination without abnormal findings: Secondary | ICD-10-CM

## 2013-04-05 DIAGNOSIS — E785 Hyperlipidemia, unspecified: Secondary | ICD-10-CM

## 2013-04-05 DIAGNOSIS — T887XXA Unspecified adverse effect of drug or medicament, initial encounter: Secondary | ICD-10-CM

## 2013-04-05 LAB — CBC WITH DIFFERENTIAL/PLATELET
Basophils Absolute: 0 K/uL (ref 0.0–0.1)
Basophils Relative: 0.3 % (ref 0.0–3.0)
Eosinophils Absolute: 0.2 K/uL (ref 0.0–0.7)
Eosinophils Relative: 3.4 % (ref 0.0–5.0)
HCT: 41.7 % (ref 36.0–46.0)
Hemoglobin: 13.7 g/dL (ref 12.0–15.0)
Lymphocytes Relative: 31 % (ref 12.0–46.0)
Lymphs Abs: 1.8 K/uL (ref 0.7–4.0)
MCHC: 32.8 g/dL (ref 30.0–36.0)
MCV: 85.1 fl (ref 78.0–100.0)
Monocytes Absolute: 0.4 K/uL (ref 0.1–1.0)
Monocytes Relative: 6.7 % (ref 3.0–12.0)
Neutro Abs: 3.3 K/uL (ref 1.4–7.7)
Neutrophils Relative %: 58.6 % (ref 43.0–77.0)
Platelets: 238 K/uL (ref 150.0–400.0)
RBC: 4.9 Mil/uL (ref 3.87–5.11)
RDW: 13.6 % (ref 11.5–14.6)
WBC: 5.7 K/uL (ref 4.5–10.5)

## 2013-04-05 NOTE — Progress Notes (Signed)
Pre visit review using our clinic review tool, if applicable. No additional management support is needed unless otherwise documented below in the visit note. 

## 2013-04-05 NOTE — Progress Notes (Signed)
Subjective:    Patient ID: [name], female    DOB: 03-27-1931, 80 y.o.   MRN: [id number]  HPI [employer] and has changed HTN medication to benicar She is stable on this medication Has not noted any Chest pains or abnormal shortness of breath    Review of Systems  Constitutional: Positive for activity change and fatigue.  HENT: Negative.   Respiratory: Positive for shortness of breath (with exercise).   Cardiovascular: Negative.   Gastrointestinal: Negative.   Genitourinary: Positive for frequency.  Musculoskeletal: Positive for back pain.       Weakness in leg Poor posture  Neurological:       Weakness with standing  Psychiatric/Behavioral: Positive for decreased concentration. Negative for behavioral problems, dysphoric mood and agitation.   Past Medical History  Diagnosis Date  . Diabetes mellitus   . Hypertension   . Fibrocystic breast disease   . Meningioma   . Tendon tear, ankle     History   Social History  . Marital Status: Widowed    Spouse Name: N/A    Number of Children: N/A  . Years of Education: N/A   Occupational History  . Not on file.   Social History Main Topics  . Smoking status: Never Smoker   . Smokeless tobacco: Not on file  . Alcohol Use: No  . Drug Use: No  . Sexual Activity: No   Other Topics Concern  . Not on file   Social History Narrative  . No narrative on file    Past Surgical History  Procedure Laterality Date  . Breast lumpectomy    . Tonsillectomy    . Tendon tear    . Colonoscopy      05/25/2000. Results: Normal. Hemorrhoids    Family History  Problem Relation Age of Onset  . Cancer Mother   . Heart disease Father   . Stroke Father     Allergies  Allergen Reactions  . Sulfonamide Derivatives     REACTION: childhood unknown type    Current Outpatient Prescriptions on File Prior to Visit  Medication Sig Dispense Refill  . aspirin 81 MG tablet Take 81 mg by mouth daily.        Marland Kitchen atenolol  (TENORMIN) 100 MG tablet TAKE 1 TABLET BY MOUTH ONCE DAILY  30 tablet  4  . olmesartan-hydrochlorothiazide (BENICAR HCT) 40-12.5 MG per tablet Take 1 tablet by mouth daily.  30 tablet  6   No current facility-administered medications on file prior to visit.    BP 140/80  Pulse 76  Temp(Src) 98.2 F (36.8 C)  Resp 16  Ht 5\' 5"  (1.651 m)  Wt 186 lb (84.369 kg)  BMI 30.95 kg/m2       Objective:   Physical Exam  Nursing note and vitals reviewed. Constitutional: She is oriented to person, place, and time. She appears well-developed and well-nourished.  HENT:  Head: Normocephalic and atraumatic.  Eyes: Pupils are equal, round, and reactive to light.  Neck: Normal range of motion. Neck supple.  Cardiovascular: Normal rate and regular rhythm.   Pulmonary/Chest: Effort normal and breath sounds normal.  Abdominal: Bowel sounds are normal.  Musculoskeletal: She exhibits no edema and no tenderness.  Neurological: She is alert and oriented to person, place, and time.  Skin: Skin is warm and dry.  Psychiatric: She has a normal mood and affect. Thought content normal.  Loss of short term          Assessment &  Plan:  Back strengthening Posture work Adherence with medications HTN stable on benicar Memory... Passed the reversal of world/ dlrow Can recall 2/3 objects  Subjective:    [name] is a 80 y.o. female who presents for Medicare Annual/Subsequent preventive examination.  Preventive Screening-Counseling & Management  Tobacco History  Smoking status  . Never Smoker   Smokeless tobacco  . Not on file     Problems Prior to Visit 1.   Current Problems (verified) Patient Active Problem List   Diagnosis Date Noted  . HYPERGLYCEMIA, BORDERLINE 01/21/2010  . UNS ADVRS EFF UNS RX MEDICINAL&BIOLOGICAL SBSTNC 01/21/2010  . KNEE PAIN, RIGHT 10/02/2009  . URINARY HESITANCY 10/02/2009  . OSTEOPENIA 06/19/2009  . [place], [place] 09/14/2008  .  DIZZINESS, CHRONIC 09/14/2008  . LIPOMA 06/14/2008  . ALLERGIC RHINITIS DUE TO OTHER ALLERGEN 06/14/2008  . NECK PAIN 04/30/2008  . CELLULITIS AND ABSCESS OF NECK 03/18/2007  . SEBACEOUS CYST, NECK 02/08/2007  . CHEST PAIN 10/28/2006  . MENINGIOMA 09/21/2006  . HYPERTENSION 09/21/2006  . FIBROCYSTIC BREAST DISEASE 09/21/2006    Medications Prior to Visit Current Outpatient Prescriptions on File Prior to Visit  Medication Sig Dispense Refill  . aspirin 81 MG tablet Take 81 mg by mouth daily.        Marland Kitchen atenolol (TENORMIN) 100 MG tablet TAKE 1 TABLET BY MOUTH ONCE DAILY  30 tablet  4  . olmesartan-hydrochlorothiazide (BENICAR HCT) 40-12.5 MG per tablet Take 1 tablet by mouth daily.  30 tablet  6   No current facility-administered medications on file prior to visit.    Current Medications (verified) Current Outpatient Prescriptions  Medication Sig Dispense Refill  . aspirin 81 MG tablet Take 81 mg by mouth daily.        Marland Kitchen atenolol (TENORMIN) 100 MG tablet TAKE 1 TABLET BY MOUTH ONCE DAILY  30 tablet  4  . olmesartan-hydrochlorothiazide (BENICAR HCT) 40-12.5 MG per tablet Take 1 tablet by mouth daily.  30 tablet  6   No current facility-administered medications for this visit.     Allergies (verified) Sulfonamide derivatives   PAST HISTORY  Family History Family History  Problem Relation Age of Onset  . Cancer Mother   . Heart disease Father   . Stroke Father     Social History History  Substance Use Topics  . Smoking status: Never Smoker   . Smokeless tobacco: Not on file  . Alcohol Use: No     Are there smokers in your home (other than you)? No  Risk Factors Current exercise habits: Gym/ health club routine includes cardio, low impact aerobics and treadmill.  Dietary issues discussed: watch sugar!!!!   Cardiac risk factors: dyslipidemia and hypertension.  Depression Screen (Note: if answer to either of the following is "Yes", a more complete depression  screening is indicated)   Over the past two weeks, have you felt down, depressed or hopeless? No  Over the past two weeks, have you felt little interest or pleasure in doing things? No  Have you lost interest or pleasure in daily life? No  Do you often feel hopeless? No  Do you cry easily over simple problems? No  Activities of Daily Living In your present state of health, do you have any difficulty performing the following activities?:  Driving? No Managing money?  No Feeding yourself? No Getting from bed to chair? No Climbing a flight of stairs? No Preparing food and eating?: No Bathing or showering? No Getting dressed: No Getting to  the toilet? No Using the toilet:No Moving around from place to place: No In the past year have you fallen or had a near fall?:No   Are you sexually active?  No  Do you have more than one partner?  No  Hearing Difficulties: No Do you often ask people to speak up or repeat themselves? No Do you experience ringing or noises in your ears? Yes Do you have difficulty understanding soft or whispered voices? No   Do you feel that you have a problem with memory? Yes  Do you often misplace items? Yes  Do you feel safe at home?  Yes  Cognitive Testing  Alert? Yes  Normal Appearance?Yes  Oriented to person? Yes  Place? Yes   Time? Yes  Recall of three objects?  Yes  Can perform simple calculations? Yes  Displays appropriate judgment?Yes  Can read the correct time from a watch face?Yes   Advanced Directives have been discussed with the patient? Yes  List the Names of Other Physician/Practitioners you currently use: 1.    Indicate any recent Medical Services you may have received from other than [facility] providers in the past year (date may be approximate).  There is no immunization history for the selected administration types on file for this patient.  Screening Tests Health Maintenance  Topic Date Due  . Tetanus/tdap  01/09/1953  . Zostavax   01/09/1994  . Pneumococcal Polysaccharide Vaccine Age 41 And Over  01/10/1999  . Colonoscopy  06/01/2010  . Influenza Vaccine  09/30/2012    All answers were reviewed with the patient and necessary referrals were made:  Georgetta Haber, MD   04/05/2013   History reviewed: allergies, current medications, past family history, past medical history, past social history, past surgical history and problem list  Review of Systems Pertinent items are noted in HPI.    Objective:     Vision by Snellen chart: right eye:20/20, left eye:20/20 with glasses  Body mass index is 30.95 kg/(m^2). BP 140/80  Pulse 76  Temp(Src) 98.2 F (36.8 C)  Resp 16  Ht 5\' 5"  (1.651 m)  Wt 186 lb (84.369 kg)  BMI 30.95 kg/m2  Exam per problem sections     Assessment:      This is a routine physical examination for this healthy  Female. Reviewed all health maintenance protocols including mammography colonoscopy bone density and reviewed appropriate screening labs. Her immunization history was reviewed as well as her current medications and allergies refills of her chronic medications were given and the plan for yearly health maintenance was discussed all orders and referrals were made as appropriate.      Plan:     During the course of the visit the patient was educated and counseled about appropriate screening and preventive services including:    Influenza vaccine  Hepatitis B vaccine  Screening mammography  Refused immunizations!!!!!  Diet review for nutrition referral? Yes ____  Not Indicated ____   Patient Instructions (the written plan) was given to the patient.  Medicare Attestation I have personally reviewed: The patient's medical and social history Their use of alcohol, tobacco or illicit drugs Their current medications and supplements The patient's functional ability including ADLs,fall risks, home safety risks, cognitive, and hearing and visual impairment Diet and physical  activities Evidence for depression or mood disorders  The patient's weight, height, BMI, and visual acuity have been recorded in the chart.  I have made referrals, counseling, and provided education to the patient based on review of  the above and I have provided the patient with a written personalized care plan for preventive services.   Referred to audiology for hearing loss  Georgetta Haber, MD   04/05/2013

## 2013-04-05 NOTE — Patient Instructions (Signed)
The patient is instructed to continue all medications as prescribed. Schedule followup with check out clerk upon leaving the clinic  

## 2013-04-06 LAB — HEPATIC FUNCTION PANEL
ALT: 24 U/L (ref 0–35)
AST: 20 U/L (ref 0–37)
Albumin: 4.1 g/dL (ref 3.5–5.2)
Alkaline Phosphatase: 73 U/L (ref 39–117)
BILIRUBIN DIRECT: 0.1 mg/dL (ref 0.0–0.3)
BILIRUBIN TOTAL: 0.8 mg/dL (ref 0.3–1.2)
Total Protein: 6.9 g/dL (ref 6.0–8.3)

## 2013-04-06 LAB — BASIC METABOLIC PANEL
BUN: 12 mg/dL (ref 6–23)
CO2: 31 mEq/L (ref 19–32)
Calcium: 9.7 mg/dL (ref 8.4–10.5)
Chloride: 101 mEq/L (ref 96–112)
Creatinine, Ser: 0.8 mg/dL (ref 0.4–1.2)
GFR: 72.45 mL/min (ref >60.00)
GLUCOSE: 94 mg/dL (ref 70–99)
POTASSIUM: 4.6 meq/L (ref 3.5–5.1)
Sodium: 139 mEq/L (ref 135–145)

## 2013-04-06 LAB — TSH: TSH: 1.61 u[IU]/mL (ref 0.35–5.50)

## 2013-04-06 LAB — LIPID PANEL
Cholesterol: 298 mg/dL — ABNORMAL HIGH (ref 0–200)
HDL: 42.5 mg/dL (ref >39.00)
Total CHOL/HDL Ratio: 7
Triglycerides: 274 mg/dL — ABNORMAL HIGH (ref 0.0–149.0)
VLDL: 54.8 mg/dL — AB (ref 0.0–40.0)

## 2013-04-06 LAB — LDL CHOLESTEROL, DIRECT: Direct LDL: 216.2 mg/dL

## 2013-04-06 LAB — VITAMIN D 25 HYDROXY (VIT D DEFICIENCY, FRACTURES): Vit D, 25-Hydroxy: 46 ng/mL (ref 30–89)

## 2013-04-07 ENCOUNTER — Telehealth: Payer: Self-pay | Admitting: Internal Medicine

## 2013-04-07 NOTE — Telephone Encounter (Signed)
Relevant patient education mailed to patient.  

## 2013-04-16 ENCOUNTER — Emergency Department (HOSPITAL_COMMUNITY): Payer: Medicare HMO

## 2013-04-16 ENCOUNTER — Inpatient Hospital Stay (HOSPITAL_COMMUNITY)
Admission: EM | Admit: 2013-04-16 | Discharge: 2013-04-25 | DRG: 247 | Disposition: A | Discharge status: Home / Self Care | Payer: Medicare HMO | Attending: Cardiology | Admitting: Cardiology

## 2013-04-16 ENCOUNTER — Ambulatory Visit (INDEPENDENT_AMBULATORY_CARE_PROVIDER_SITE_OTHER): Payer: Commercial Managed Care - HMO | Admitting: Family Medicine

## 2013-04-16 ENCOUNTER — Encounter (HOSPITAL_COMMUNITY): Payer: Self-pay | Admitting: Emergency Medicine

## 2013-04-16 VITALS — BP 90/58 | HR 51 | Temp 97.7°F | Resp 18 | Ht 64.0 in | Wt 186.0 lb

## 2013-04-16 DIAGNOSIS — R001 Bradycardia, unspecified: Secondary | ICD-10-CM

## 2013-04-16 DIAGNOSIS — R7989 Other specified abnormal findings of blood chemistry: Secondary | ICD-10-CM

## 2013-04-16 DIAGNOSIS — R12 Heartburn: Secondary | ICD-10-CM

## 2013-04-16 DIAGNOSIS — I214 Non-ST elevation (NSTEMI) myocardial infarction: Secondary | ICD-10-CM

## 2013-04-16 DIAGNOSIS — Z7982 Long term (current) use of aspirin: Secondary | ICD-10-CM

## 2013-04-16 DIAGNOSIS — R079 Chest pain, unspecified: Secondary | ICD-10-CM

## 2013-04-16 DIAGNOSIS — R21 Rash and other nonspecific skin eruption: Secondary | ICD-10-CM | POA: Diagnosis not present

## 2013-04-16 DIAGNOSIS — I2510 Atherosclerotic heart disease of native coronary artery without angina pectoris: Secondary | ICD-10-CM

## 2013-04-16 DIAGNOSIS — R9431 Abnormal electrocardiogram [ECG] [EKG]: Secondary | ICD-10-CM

## 2013-04-16 DIAGNOSIS — R011 Cardiac murmur, unspecified: Secondary | ICD-10-CM

## 2013-04-16 DIAGNOSIS — I4891 Unspecified atrial fibrillation: Secondary | ICD-10-CM | POA: Diagnosis not present

## 2013-04-16 DIAGNOSIS — M25512 Pain in left shoulder: Secondary | ICD-10-CM

## 2013-04-16 DIAGNOSIS — Z86011 Personal history of benign neoplasm of the brain: Secondary | ICD-10-CM

## 2013-04-16 DIAGNOSIS — N6019 Diffuse cystic mastopathy of unspecified breast: Secondary | ICD-10-CM | POA: Diagnosis present

## 2013-04-16 DIAGNOSIS — E785 Hyperlipidemia, unspecified: Secondary | ICD-10-CM

## 2013-04-16 DIAGNOSIS — Z955 Presence of coronary angioplasty implant and graft: Secondary | ICD-10-CM

## 2013-04-16 DIAGNOSIS — R5383 Other fatigue: Secondary | ICD-10-CM

## 2013-04-16 DIAGNOSIS — E782 Mixed hyperlipidemia: Secondary | ICD-10-CM | POA: Diagnosis present

## 2013-04-16 DIAGNOSIS — J811 Chronic pulmonary edema: Secondary | ICD-10-CM | POA: Diagnosis not present

## 2013-04-16 DIAGNOSIS — E119 Type 2 diabetes mellitus without complications: Secondary | ICD-10-CM | POA: Diagnosis present

## 2013-04-16 DIAGNOSIS — D320 Benign neoplasm of cerebral meninges: Secondary | ICD-10-CM | POA: Diagnosis present

## 2013-04-16 DIAGNOSIS — R0902 Hypoxemia: Secondary | ICD-10-CM | POA: Diagnosis not present

## 2013-04-16 DIAGNOSIS — R197 Diarrhea, unspecified: Secondary | ICD-10-CM | POA: Diagnosis not present

## 2013-04-16 DIAGNOSIS — I10 Essential (primary) hypertension: Secondary | ICD-10-CM | POA: Diagnosis present

## 2013-04-16 DIAGNOSIS — R7309 Other abnormal glucose: Secondary | ICD-10-CM

## 2013-04-16 DIAGNOSIS — E876 Hypokalemia: Secondary | ICD-10-CM | POA: Diagnosis not present

## 2013-04-16 DIAGNOSIS — R682 Dry mouth, unspecified: Secondary | ICD-10-CM

## 2013-04-16 DIAGNOSIS — M25519 Pain in unspecified shoulder: Secondary | ICD-10-CM

## 2013-04-16 DIAGNOSIS — R5381 Other malaise: Secondary | ICD-10-CM

## 2013-04-16 DIAGNOSIS — Z8249 Family history of ischemic heart disease and other diseases of the circulatory system: Secondary | ICD-10-CM

## 2013-04-16 DIAGNOSIS — Z79899 Other long term (current) drug therapy: Secondary | ICD-10-CM

## 2013-04-16 DIAGNOSIS — M7660 Achilles tendinitis, unspecified leg: Secondary | ICD-10-CM | POA: Diagnosis not present

## 2013-04-16 DIAGNOSIS — I498 Other specified cardiac arrhythmias: Secondary | ICD-10-CM

## 2013-04-16 DIAGNOSIS — T448X5A Adverse effect of centrally-acting and adrenergic-neuron-blocking agents, initial encounter: Secondary | ICD-10-CM | POA: Diagnosis not present

## 2013-04-16 DIAGNOSIS — I9589 Other hypotension: Secondary | ICD-10-CM | POA: Diagnosis present

## 2013-04-16 LAB — CBC WITH DIFFERENTIAL/PLATELET
BASOS ABS: 0 10*3/uL (ref 0.0–0.1)
Basophils Relative: 0 % (ref 0–1)
EOS ABS: 0 10*3/uL (ref 0.0–0.7)
Eosinophils Relative: 0 % (ref 0–5)
HCT: 37.9 % (ref 36.0–46.0)
Hemoglobin: 13.2 g/dL (ref 12.0–15.0)
LYMPHS ABS: 1.6 10*3/uL (ref 0.7–4.0)
LYMPHS PCT: 12 % (ref 12–46)
MCH: 28.9 pg (ref 26.0–34.0)
MCHC: 34.8 g/dL (ref 30.0–36.0)
MCV: 82.9 fL (ref 78.0–100.0)
Monocytes Absolute: 1.1 10*3/uL — ABNORMAL HIGH (ref 0.1–1.0)
Monocytes Relative: 8 % (ref 3–12)
NEUTROS PCT: 80 % — AB (ref 43–77)
Neutro Abs: 11.1 10*3/uL — ABNORMAL HIGH (ref 1.7–7.7)
PLATELETS: 203 10*3/uL (ref 150–400)
RBC: 4.57 MIL/uL (ref 3.87–5.11)
RDW: 13 % (ref 11.5–15.5)
WBC: 13.8 10*3/uL — AB (ref 4.0–10.5)

## 2013-04-16 LAB — GLUCOSE, POCT (MANUAL RESULT ENTRY): POC Glucose: 99 mg/dl (ref 70–99)

## 2013-04-16 LAB — POCT CBC
GRANULOCYTE PERCENT: 82 % — AB (ref 37–80)
HCT, POC: 41.4 % (ref 37.7–47.9)
Hemoglobin: 13.2 g/dL (ref 12.2–16.2)
Lymph, poc: 1.9 (ref 0.6–3.4)
MCH, POC: 28.3 pg (ref 27–31.2)
MCHC: 31.9 g/dL (ref 31.8–35.4)
MCV: 88.8 fL (ref 80–97)
MID (CBC): 0.8 (ref 0–0.9)
MPV: 9.1 fL (ref 0–99.8)
PLATELET COUNT, POC: 219 10*3/uL (ref 142–424)
POC Granulocyte: 12.1 — AB (ref 2–6.9)
POC LYMPH PERCENT: 12.8 %L (ref 10–50)
POC MID %: 5.2 % (ref 0–12)
RBC: 4.66 M/uL (ref 4.04–5.48)
RDW, POC: 13.8 %
WBC: 14.7 10*3/uL — AB (ref 4.6–10.2)

## 2013-04-16 LAB — BASIC METABOLIC PANEL
BUN: 16 mg/dL (ref 6–23)
CO2: 28 mEq/L (ref 19–32)
Calcium: 9.8 mg/dL (ref 8.4–10.5)
Chloride: 96 mEq/L (ref 96–112)
Creatinine, Ser: 0.7 mg/dL (ref 0.50–1.10)
GFR calc Af Amer: >90 mL/min (ref >90)
GFR, EST NON AFRICAN AMERICAN: 80 mL/min — AB (ref >90)
GLUCOSE: 127 mg/dL — AB (ref 70–99)
POTASSIUM: 4.1 meq/L (ref 3.7–5.3)
SODIUM: 136 meq/L — AB (ref 137–147)

## 2013-04-16 LAB — PROTIME-INR
INR: 0.99 (ref 0.00–1.49)
PROTHROMBIN TIME: 12.9 s (ref 11.6–15.2)

## 2013-04-16 LAB — POCT I-STAT TROPONIN I: TROPONIN I, POC: 5.18 ng/mL — AB (ref 0.00–0.08)

## 2013-04-16 LAB — TROPONIN I: Troponin I: 11.24 ng/mL (ref <0.30)

## 2013-04-16 MED ORDER — NITROGLYCERIN 0.4 MG SL SUBL: 0.4000 mg | SUBLINGUAL_TABLET | SUBLINGUAL | Status: DC | PRN

## 2013-04-16 MED ORDER — ATORVASTATIN CALCIUM 80 MG PO TABS
80.0000 mg | ORAL_TABLET | Freq: Every day | ORAL | Status: DC
  Filled 2013-04-16: qty 1

## 2013-04-16 MED ORDER — IRBESARTAN 300 MG PO TABS
300.0000 mg | ORAL_TABLET | Freq: Every day | ORAL | Status: DC
  Filled 2013-04-16: qty 1

## 2013-04-16 MED ORDER — HEPARIN BOLUS VIA INFUSION
4000.0000 [IU] | Freq: Once | INTRAVENOUS | Status: AC
  Administered 2013-04-16: 4000 [IU] via INTRAVENOUS
  Filled 2013-04-16: qty 4000

## 2013-04-16 MED ORDER — NITROGLYCERIN IN D5W 200-5 MCG/ML-% IV SOLN: 3.0000 ug/min | INTRAVENOUS | Status: DC

## 2013-04-16 MED ORDER — ATORVASTATIN CALCIUM 80 MG PO TABS
80.0000 mg | ORAL_TABLET | ORAL | Status: AC
  Administered 2013-04-16: 80 mg via ORAL
  Filled 2013-04-16: qty 1

## 2013-04-16 MED ORDER — ATENOLOL 100 MG PO TABS
100.0000 mg | ORAL_TABLET | Freq: Every evening | ORAL | Status: DC
  Filled 2013-04-16: qty 1
  Filled 2013-04-16: qty 2

## 2013-04-16 MED ORDER — SODIUM CHLORIDE 0.9 % IJ SOLN
3.0000 mL | Freq: Two times a day (BID) | INTRAMUSCULAR | Status: DC
  Administered 2013-04-17: 3 mL via INTRAVENOUS

## 2013-04-16 MED ORDER — HEPARIN (PORCINE) IN NACL 100-0.45 UNIT/ML-% IJ SOLN: 12.0000 [IU]/kg/h | Freq: Once | INTRAMUSCULAR | Status: DC

## 2013-04-16 MED ORDER — SODIUM CHLORIDE 0.9 % IJ SOLN: 3.0000 mL | INTRAMUSCULAR | Status: DC | PRN

## 2013-04-16 MED ORDER — ASPIRIN 81 MG PO CHEW
81.0000 mg | CHEWABLE_TABLET | Freq: Every day | ORAL | Status: DC
  Administered 2013-04-17 – 2013-04-18 (×2): 81 mg via ORAL
  Filled 2013-04-16 (×2): qty 1

## 2013-04-16 MED ORDER — ASPIRIN 81 MG PO CHEW
324.0000 mg | CHEWABLE_TABLET | Freq: Once | ORAL | Status: AC
  Administered 2013-04-16: 324 mg via ORAL
  Filled 2013-04-16: qty 4

## 2013-04-16 MED ORDER — SODIUM CHLORIDE 0.9 % IV SOLN: 250.0000 mL | INTRAVENOUS | Status: DC | PRN

## 2013-04-16 MED ORDER — ASPIRIN 81 MG PO CHEW
81.0000 mg | CHEWABLE_TABLET | ORAL | Status: AC
  Administered 2013-04-17: 81 mg via ORAL
  Filled 2013-04-16: qty 1

## 2013-04-16 MED ORDER — SODIUM CHLORIDE 0.9 % IV SOLN
INTRAVENOUS | Status: DC
  Administered 2013-04-17: 04:00:00 via INTRAVENOUS

## 2013-04-16 MED ORDER — HEPARIN SODIUM (PORCINE) 5000 UNIT/ML IJ SOLN: 60.0000 [IU]/kg | Freq: Once | INTRAMUSCULAR | Status: DC

## 2013-04-16 MED ORDER — HEPARIN (PORCINE) IN NACL 100-0.45 UNIT/ML-% IJ SOLN
850.0000 [IU]/h | INTRAMUSCULAR | Status: DC
  Administered 2013-04-16: 850 [IU]/h via INTRAVENOUS
  Filled 2013-04-16 (×3): qty 250

## 2013-04-16 NOTE — ED Notes (Signed)
Webb MD at bedside. 

## 2013-04-16 NOTE — Progress Notes (Signed)
ANTICOAGULATION CONSULT NOTE - Initial Consult  Pharmacy Consult for Heparin Indication: chest pain/ACS  Allergies  Allergen Reactions  . Sulfonamide Derivatives Other (See Comments)    REACTION: childhood unknown type, ended up in hospital    Patient Measurements: Height: 5' 4.17" (163 cm) Weight: 186 lb 1.1 oz (84.4 kg) IBW/kg (Calculated) : 55.1 Heparin Dosing Weight: 73 kg   Vital Signs: Temp: 99 F (37.2 C) (02/15 1913) Temp src: Oral (02/15 1913) BP: 102/85 mmHg (02/15 1913) Pulse Rate: 53 (02/15 1913)  Labs:  Recent Labs  04/16/13 1837 04/16/13 2009  HGB 13.2 13.2  HCT 41.4 37.9  PLT  --  203    Estimated Creatinine Clearance: 60.1 ml/min (by C-G formula based on Cr of 0.8).   Medical History: Past Medical History  Diagnosis Date  . Diabetes mellitus   . Hypertension   . Fibrocystic breast disease   . Meningioma   . Tendon tear, ankle     Medications:   (Not in a hospital admission)  Assessment: 81 YOF to start on heparin for chest pain and elevated troponin at 5.18. H/H wnl, Plt wnl.   Goal of Therapy:  Heparin level 0.3-0.7 units/ml Monitor platelets by anticoagulation protocol: Yes   Plan:  Give 4000 units bolus x 1 Start heparin infusion at 850 units/hr Check anti-Xa level in 8 hours and daily while on heparin Continue to monitor H&H and platelets  Albertina Parr, PharmD.  Clinical Pharmacist Pager [phone]

## 2013-04-16 NOTE — Progress Notes (Addendum)
Subjective:  This chart was scribed for Autumn Ray, MD by Donato Schultz, Medical Scribe. This patient was seen in Room 10 and the patient's care was started at 5:36 PM.   Patient ID: [name], female    DOB: 08/09/1931, 80 y.o.   MRN: [id number]  HPI HPI Comments: [name] is a 80 y.o. female who presents to the [facility] complaining of a constant sensation in her chest similar to heartburn that started yesterday morning.  The patient states that the day before she ate at [facility].  She states that she was belching a lot yesterday.  She lists decreased appetite, fatigue, dry mouth, and nausea as associated symptoms.  She states that she has felt intermittent nausea today.  She denies fever, vomiting, diaphoresis, chest pain, SOB, lightheaded, and dizziness as an associated symptoms.  She states that she has been drinking less fluids than normal today.  She states that she tried OTC medications for heartburn with mild relief to her symptoms.  She denies taking her Atenolol today.    The patient states that she has been experiencing intermittent left shoulder pain that is unrelated to the abdominal pain.  She states that the left shoulder pain feels similar to pain she has experienced before.    The patient states that she would also like to get her blood sugar checked.  The patient's PCP is Dr. Benay Pillow.  She was last seen 11 days ago for annual exam.  Of note, glucose was normal at 94 on labs from that visit as well as a hepatic function panel and remainder of BMP as well as CBC.     Patient Active Problem List   Diagnosis Date Noted  . HYPERGLYCEMIA, BORDERLINE 01/21/2010  . UNS ADVRS EFF UNS RX MEDICINAL&BIOLOGICAL SBSTNC 01/21/2010  . KNEE PAIN, RIGHT 10/02/2009  . URINARY HESITANCY 10/02/2009  . OSTEOPENIA 06/19/2009  . [place], [place] 09/14/2008  . DIZZINESS, CHRONIC 09/14/2008  . LIPOMA 06/14/2008  . ALLERGIC RHINITIS  DUE TO OTHER ALLERGEN 06/14/2008  . NECK PAIN 04/30/2008  . CELLULITIS AND ABSCESS OF NECK 03/18/2007  . SEBACEOUS CYST, NECK 02/08/2007  . CHEST PAIN 10/28/2006  . MENINGIOMA 09/21/2006  . HYPERTENSION 09/21/2006  . FIBROCYSTIC BREAST DISEASE 09/21/2006   Past Medical History  Diagnosis Date  . Diabetes mellitus   . Hypertension   . Fibrocystic breast disease   . Meningioma   . Tendon tear, ankle    Past Surgical History  Procedure Laterality Date  . Breast lumpectomy    . Tonsillectomy    . Tendon tear    . Colonoscopy      05/25/2000. Results: Normal. Hemorrhoids   Allergies  Allergen Reactions  . Sulfonamide Derivatives     REACTION: childhood unknown type   Prior to Admission medications   Medication Sig Start Date End Date Taking? Authorizing Provider  aspirin 81 MG tablet Take 81 mg by mouth daily.     Yes Historical Provider, MD  atenolol (TENORMIN) 100 MG tablet TAKE 1 TABLET BY MOUTH ONCE DAILY 03/24/13  Yes Ricard Dillon, MD  olmesartan-hydrochlorothiazide (BENICAR HCT) 40-12.5 MG per tablet Take 1 tablet by mouth daily. 03/27/13  Yes Ricard Dillon, MD   History   Social History  . Marital Status: Widowed    Spouse Name: N/A    Number of Children: N/A  . Years of Education: N/A   Occupational History  . Not on file.  Social History Main Topics  . Smoking status: Never Smoker   . Smokeless tobacco: Not on file  . Alcohol Use: No  . Drug Use: No  . Sexual Activity: No   Other Topics Concern  . Not on file   Social History Narrative  . No narrative on file     Review of Systems  Constitutional: Positive for appetite change (decreased) and fatigue. Negative for fever and diaphoresis.  Respiratory: Negative for shortness of breath.   Cardiovascular: Negative for chest pain.  Gastrointestinal: Positive for nausea and abdominal pain. Negative for vomiting.  Musculoskeletal: Positive for arthralgias (left shoulder).  Neurological: Negative for  dizziness and light-headedness.    Further review of medical record September 13, 2012 office visit with Dr. Burnice Logan - Had palpitations prior with some inferior lateral ST T-wave changes that were more prominent than compared to 2012.  Dicussed echo, nuclear stress test, or cardiology eval but declined in favor of caffeine moderation first.   Objective:  Physical Exam  Vitals reviewed. Constitutional: She is oriented to person, place, and time. She appears well-developed and well-nourished. No distress.  HENT:  Head: Normocephalic and atraumatic.  Right Ear: Hearing, tympanic membrane, external ear and ear canal normal.  Left Ear: Hearing, tympanic membrane, external ear and ear canal normal.  Nose: Nose normal.  Mouth/Throat: Oropharynx is clear and moist and mucous membranes are normal. No oropharyngeal exudate.  Eyes: Conjunctivae and EOM are normal. Pupils are equal, round, and reactive to light.  Neck: Carotid bruit is not present.  Cardiovascular: Normal rate, regular rhythm, normal heart sounds and intact distal pulses.   No murmur heard. Pulmonary/Chest: Effort normal and breath sounds normal. No respiratory distress. She has no wheezes. She has no rhonchi.  Abdominal: Soft. She exhibits no pulsatile midline mass. There is no tenderness.  Neurological: She is alert and oriented to person, place, and time.  Skin: Skin is warm and dry. No rash noted.  Psychiatric: She has a normal mood and affect. Her behavior is normal.     EKG - Sinus bradycardia rate 52.  ST depression in 1, 2, V2 - V6.  T-wave inversion in 2, 3.  AVF V3 - V6.  Appears to have more ST depression laterally than compared to July 2014 EKG.     Filed Vitals:   04/16/13 1712  BP: 100/60  Pulse: 51  Temp: 97.7 F (36.5 C)  TempSrc: Oral  Resp: 18  Height: 5\' 4"  (1.626 m)  Weight: 186 lb (84.369 kg)  SpO2: 94%   Results for orders placed in visit on 04/16/13  POCT CBC      Result Value Ref Range   WBC 14.7  (*) 4.6 - 10.2 K/uL   Lymph, poc 1.9  0.6 - 3.4   POC LYMPH PERCENT 12.8  10 - 50 %L   MID (cbc) 0.8  0 - 0.9   POC MID % 5.2  0 - 12 %M   POC Granulocyte 12.1 (*) 2 - 6.9   Granulocyte percent 82.0 (*) 37 - 80 %G   RBC 4.66  4.04 - 5.48 M/uL   Hemoglobin 13.2  12.2 - 16.2 g/dL   HCT, POC 41.4  37.7 - 47.9 %   MCV 88.8  80 - 97 fL   MCH, POC 28.3  27 - 31.2 pg   MCHC 31.9  31.8 - 35.4 g/dL   RDW, POC 13.8     Platelet Count, POC 219  142 - 424 K/uL  MPV 9.1  0 - 99.8 fL  GLUCOSE, POCT (MANUAL RESULT ENTRY)      Result Value Ref Range   POC Glucose 99  70 - 99 mg/dl     Assessment & Plan:    SIONA MONTAG is a 80 y.o. female Left shoulder pain  Fatigue - Plan: POCT CBC, POCT glucose (manual entry)  Chest pain - Plan: EKG 12-Lead  Heart burn  Dry mouth - Plan: POCT glucose (manual entry)  Bradycardia, sinus  Nonspecific abnormal electrocardiogram (ECG) (EKG)  3 day hx of generalized fatigue, chest sensation of heartburn/indigestion. EKG concerning for inferior lateral worsening ST depression and T-wave inversion, even compared to prior abnormal EKG.  Concerning for cardiac cause.  Also notable for lower heart rate than usual and borderline hypotension in office.  Transported by EMS to [facility] for eval.    Elevated WBC - ? Stress response. Afebrile, and no recent illness other than above. eval in ER.   6:39 PM - EMS on scene report given.  Charge nurse advised at [facility].  Repeat blood pressure 90/58 left arm.  IV in place and EMS transporting patient. Of note - pt did not take toprol today.

## 2013-04-16 NOTE — ED Provider Notes (Signed)
CSN: [id number]     Arrival date & time 04/16/13  1858 History   First MD Initiated Contact with Patient 04/16/13 1905     Chief Complaint  Patient presents with  . Abnormal ECG   HPI Comments: 80 yo F hx of HTN presents with CC of chest pain.  Symptoms started Saturday.  Pt has had intermittent CP, epigastric pain, nonradiating, difficult for patient to describe.  She has had associated fatigue, some nausea.  Her last pain episode started this AM around 7 AM, and lasted until 2 PM.  She does not recall any exacerbating factors, and pain occurred at rest.  She denies, fever, chills, SOB, cough, vomiting, diarrhea, constipation, myalgias, rash, or any other symptoms.  Pt tried OTC meds without relief.  Pt went to urgent care tonight and there was concern for abnormal EKG and pt was sent to ED for further evaluation.  Pt denies personal hx of CAD.  She is a nonsmoker.  FHx + CAD in father.    The history is provided by the patient. No language interpreter was used.    Past Medical History  Diagnosis Date  . Diabetes mellitus   . Hypertension   . Fibrocystic breast disease   . Meningioma   . Tendon tear, ankle    Past Surgical History  Procedure Laterality Date  . Breast lumpectomy    . Tonsillectomy    . Tendon tear    . Colonoscopy      05/25/2000. Results: Normal. Hemorrhoids   Family History  Problem Relation Age of Onset  . Cancer Mother   . Heart disease Father   . Stroke Father    History  Substance Use Topics  . Smoking status: Never Smoker   . Smokeless tobacco: Not on file  . Alcohol Use: No   OB History   Grav Para Term Preterm Abortions TAB SAB Ect Mult Living                 Review of Systems  Constitutional: Negative for fever and chills.  Respiratory: Negative for cough and shortness of breath.   Cardiovascular: Positive for chest pain.  Gastrointestinal: Positive for nausea and abdominal pain. Negative for vomiting and diarrhea.  Genitourinary: Negative  for dysuria.  Musculoskeletal: Negative for myalgias.  Skin: Negative for rash.  Neurological: Negative for dizziness, weakness, light-headedness, numbness and headaches.  Hematological: Negative for adenopathy. Does not bruise/bleed easily.  All other systems reviewed and are negative.      Allergies  Sulfonamide derivatives  Home Medications   Current Outpatient Rx  Name  Route  Sig  Dispense  Refill  . aspirin 81 MG tablet   Oral   Take 81 mg by mouth daily.           Marland Kitchen atenolol (TENORMIN) 100 MG tablet      TAKE 1 TABLET BY MOUTH ONCE DAILY   30 tablet   4   . olmesartan-hydrochlorothiazide (BENICAR HCT) 40-12.5 MG per tablet   Oral   Take 1 tablet by mouth daily.   30 tablet   6    BP 102/85  Pulse 53  Temp(Src) 99 F (37.2 C) (Oral)  Resp 18  SpO2 97% Physical Exam  Nursing note and vitals reviewed. Constitutional: She is oriented to person, place, and time. She appears well-developed and well-nourished.  HENT:  Head: Normocephalic and atraumatic.  Right Ear: External ear normal.  Left Ear: External ear normal.  Mouth/Throat: Oropharynx is clear  and moist.  Eyes: Conjunctivae and EOM are normal. Pupils are equal, round, and reactive to light.  Neck: Normal range of motion. Neck supple.  Cardiovascular: Regular rhythm, normal heart sounds and intact distal pulses.   Sinus brady  Pulmonary/Chest: Effort normal and breath sounds normal. No respiratory distress. She has no wheezes. She has no rales. She exhibits no tenderness.  Abdominal: Soft. Bowel sounds are normal. She exhibits no distension and no mass. There is no tenderness. There is no rebound and no guarding.  Neurological: She is alert and oriented to person, place, and time.  Skin: Skin is warm and dry.    ED Course  Procedures (including critical care time) Labs Review Labs Reviewed  CBC WITH DIFFERENTIAL - Abnormal; Notable for the following:    WBC 13.8 (*)    Neutrophils Relative %  80 (*)    Neutro Abs 11.1 (*)    Monocytes Absolute 1.1 (*)    All other components within normal limits  BASIC METABOLIC PANEL - Abnormal; Notable for the following:    Sodium 136 (*)    Glucose, Bld 127 (*)    GFR calc non Af Amer 80 (*)    All other components within normal limits  TROPONIN I - Abnormal; Notable for the following:    Troponin I 11.24 (*)    All other components within normal limits  POCT I-STAT TROPONIN I - Abnormal; Notable for the following:    Troponin i, poc 5.18 (*)    All other components within normal limits  PROTIME-INR  CBC  HEPARIN LEVEL (UNFRACTIONATED)  TROPONIN I  TROPONIN I  HEMOGLOBIN A1C  PROTIME-INR  CBC  PLATELET INHIBITION P2Y12  APTT  CBC WITH DIFFERENTIAL  COMPREHENSIVE METABOLIC PANEL  OCCULT BLOOD X 1 CARD TO LAB, STOOL   Imaging Review Dg Chest 2 View  04/16/2013   CLINICAL DATA:  Chest pain and hypertension.  EXAM: CHEST  2 VIEW  COMPARISON:  None.  FINDINGS: Heart size is mildly enlarged. Lungs clear. No pneumothorax pleural effusion. No focal bony abnormality.  IMPRESSION: No acute disease.   Electronically Signed   By: Inge Rise M.D.   On: 04/16/2013 20:50    EKG Interpretation    Date/Time:  Sunday April 16 2013 19:20:51 EST Ventricular Rate:  52 PR Interval:  152 QRS Duration: 94 QT Interval:  491 QTC Calculation: 457 R Axis:   -20 Text Interpretation:  Sinus rhythm Borderline left axis deviation Low voltage, precordial leads Abnormal T, consider ischemia, diffuse leads No significant change since last tracing Confirmed by SHELDON  MD, CHARLES ([id number]) on 04/16/2013 7:29:08 PM            MDM   Final diagnoses:  None   80 yo F hx of HTN presents with CC of chest pain.   Filed Vitals:   04/16/13 2248  BP: 125/74  Pulse: 54  Temp: 98.2 F (36.8 C)  Resp: 18   Physical exam as above, and unremarkable.  Pt currently asymptomatic.  ASA given on arrival.  EKG as detailed above. NSR, with T wave  inversions in anterior, lateral, inferior leads unchanged from prior.  CXR shows no acute disease.  Labs significant for troponin of 5.18, w/ 3-hour repeat of 11.24.     Heparin bolus, infusion started.  Cardiology consulted.  Pt to be admitted to hospital for further workup of NSTEMI.  Pt and family understand and agree with plan.  Pt's care plan discussed with Dr. Karle Starch.  Sinda Du, MD   Sinda Du, MD 04/16/13 [phone]

## 2013-04-16 NOTE — ED Notes (Signed)
Turner MD at bedside 

## 2013-04-16 NOTE — ED Notes (Signed)
I stat troponin results given to Dr. Karle Starch by B. Yolanda Bonine, EMT

## 2013-04-16 NOTE — ED Provider Notes (Signed)
I saw and evaluated the patient, reviewed the resident's note and I agree with the findings and plan.  Agree with EKG interpretation if present.   Pt with atypical CP off and on since yesterday but worse today. Pos Trop, Cards consulted. Non-STEMI  Charles B. Karle Starch, MD 04/16/13 2342

## 2013-04-16 NOTE — H&P (Addendum)
Admit date: 04/16/2013 Referring Physician Dr. Karle Starch Primary Cardiologist None Chief complaint/reason for admission: NSTEMI  HPI: [name] with a history of DM and HTN who was in her [place] until Friday when she took her daughter to [employer] for lunch and then [employer] for dinner. The next day she sat on the side of the bed and all of a sudden had a pain her her chest and abdomen which she cannot describe but was also belching a lot with it. The discomfort eased up some with it.  She felt weak and tired the rest of the day and continued to have some mild discomfort in her stomach that would come and go.  She denied any SOB, diaphoretic or nausea.  Today she felt weak and tired and was in bed all day and then had another episode of discomfort in her epigastric region but she does not know how long it lasted - She is a very poor historian.  Currently she has no discomfort.  Cardiology was called due to elevated troponin.      PMH:    Past Medical History  Diagnosis Date  . Diabetes mellitus   . Hypertension   . Fibrocystic breast disease   . Meningioma   . Tendon tear, ankle     PSH:    Past Surgical History  Procedure Laterality Date  . Breast lumpectomy    . Tonsillectomy    . Tendon tear    . Colonoscopy      05/25/2000. Results: Normal. Hemorrhoids    ALLERGIES:   Sulfonamide derivatives  Prior to Admit Meds:   (Not in a hospital admission) Family HX:    Family History  Problem Relation Age of Onset  . Cancer Mother   . Heart disease Father   . Stroke Father    Social HX:    History   Social History  . Marital Status: Widowed    Spouse Name: N/A    Number of Children: N/A  . Years of Education: N/A   Occupational History  . Not on file.   Social History Main Topics  . Smoking status: Never Smoker   . Smokeless tobacco: Not on file  . Alcohol Use: No  . Drug Use: No  . Sexual Activity: No   Other Topics Concern  . Not on file   Social  History Narrative  . No narrative on file     ROS:  All 11 ROS were addressed and are negative except what is stated in the HPI  PHYSICAL EXAM Filed Vitals:   04/16/13 1913  BP: 102/85  Pulse: 53  Temp: 99 F (37.2 C)  Resp: 18   General: Well developed, well nourished, in no acute distress Head: Eyes PERRLA, No xanthomas.   Normal cephalic and atramatic  Lungs:   Clear bilaterally to auscultation and percussion. Heart:   HRRR S1 S2 Pulses are 2+ & equal. 2/6 SM at RUSB            No carotid bruit. No JVD.  No abdominal bruits. No femoral bruits. Abdomen: Bowel sounds are positive, abdomen soft and non-tender without masses Extremities:   No clubbing, cyanosis or edema.  DP +1 Neuro: Alert and oriented X 3. Psych:  Good affect, responds appropriately   Labs:   Lab Results  Component Value Date   WBC 13.8* 04/16/2013   HGB 13.2 04/16/2013   HCT 37.9 04/16/2013   MCV 82.9 04/16/2013   PLT  203 04/16/2013   No results found for this basename: NA, K, CL, CO2, BUN, CREATININE, CALCIUM, LABALBU, PROT, BILITOT, ALKPHOS, ALT, AST, GLUCOSE,  in the last 168 hours Lab Results  Component Value Date   CKTOTAL 38 09/13/2010   CKMB 1.2 09/13/2010   TROPONINI <0.30 09/13/2010   No results found for this basename: PTT   Lab Results  Component Value Date   INR 0.95 09/12/2010     Lab Results  Component Value Date   CHOL 298* 04/05/2013   CHOL 263* 01/20/2012   CHOL 269* 06/05/2011   Lab Results  Component Value Date   HDL 42.50 04/05/2013   HDL 41.00 01/20/2012   HDL 37.10* 06/05/2011   Lab Results  Component Value Date   LDLCALC 156* 09/13/2010   Lab Results  Component Value Date   TRIG 274.0* 04/05/2013   TRIG 175.0* 01/20/2012   TRIG 360.0* 06/05/2011   Lab Results  Component Value Date   CHOLHDL 7 04/05/2013   CHOLHDL 6 01/20/2012   CHOLHDL 7 06/05/2011   Lab Results  Component Value Date   LDLDIRECT 216.2 04/05/2013   LDLDIRECT 191.0 01/20/2012   LDLDIRECT 178.5 06/05/2011       Radiology:  pending  EKG:  NSR with inferolateral T wave inversions  ASSESSMENT:  1.  NSTEMI with significantly elevated troponin of 5 and ischemic T wave changes in the inferolateral leads c/w acute coronary syndrome.  She is currently asymptomatic.   2.  DM 3.  HTN 4.  Heart murmur  PLAN:   1.  Admit to tele bed  2.  IV Heparin gtt per pharmacy protocol 3.  Continue to cycle cardiac enzymes until they peak 4.  ASA/statin/beta blocker 5.  Continue ARB but hold diuretic for cath in am 6.  Cardiac cath in am 7.  Cardiac catheterization was discussed with the patient fully including risks on myocardial infarction, death, stroke, bleeding, arrhythmia, dye allergy, renal insufficiency or bleeding.  All patient questions and concerns were discussed and the patient understands and is willing to proceed.   8.  2D echo to assess LVF and heart murmur  Autumn Margarita, MD  04/16/2013  8:43 PM

## 2013-04-16 NOTE — ED Notes (Signed)
Pt sent from [facility] via EMS  For folow up on EKG changes. Pt first went to [facility] for epigastric Pain unrelieved  By OTC.

## 2013-04-16 NOTE — ED Notes (Signed)
[name] at bedside

## 2013-04-17 ENCOUNTER — Encounter (HOSPITAL_COMMUNITY)
Admission: EM | Disposition: A | Discharge status: Home / Self Care | Payer: Self-pay | Source: Home / Self Care | Attending: Cardiology

## 2013-04-17 ENCOUNTER — Other Ambulatory Visit: Payer: Self-pay | Admitting: *Deleted

## 2013-04-17 ENCOUNTER — Encounter (HOSPITAL_COMMUNITY): Payer: Self-pay | Admitting: *Deleted

## 2013-04-17 DIAGNOSIS — E785 Hyperlipidemia, unspecified: Secondary | ICD-10-CM

## 2013-04-17 DIAGNOSIS — I2510 Atherosclerotic heart disease of native coronary artery without angina pectoris: Secondary | ICD-10-CM

## 2013-04-17 DIAGNOSIS — I498 Other specified cardiac arrhythmias: Secondary | ICD-10-CM

## 2013-04-17 DIAGNOSIS — I10 Essential (primary) hypertension: Secondary | ICD-10-CM

## 2013-04-17 DIAGNOSIS — I517 Cardiomegaly: Secondary | ICD-10-CM

## 2013-04-17 HISTORY — PX: LEFT HEART CATHETERIZATION WITH CORONARY ANGIOGRAM: SHX5451

## 2013-04-17 LAB — COMPREHENSIVE METABOLIC PANEL
ALT: 27 U/L (ref 0–35)
AST: 78 U/L — AB (ref 0–37)
Albumin: 3.3 g/dL — ABNORMAL LOW (ref 3.5–5.2)
Alkaline Phosphatase: 72 U/L (ref 39–117)
BUN: 16 mg/dL (ref 6–23)
CO2: 27 meq/L (ref 19–32)
Calcium: 9.2 mg/dL (ref 8.4–10.5)
Chloride: 94 mEq/L — ABNORMAL LOW (ref 96–112)
Creatinine, Ser: 0.72 mg/dL (ref 0.50–1.10)
GFR calc non Af Amer: 80 mL/min — ABNORMAL LOW (ref >90)
Glucose, Bld: 169 mg/dL — ABNORMAL HIGH (ref 70–99)
Potassium: 3.6 mEq/L — ABNORMAL LOW (ref 3.7–5.3)
Sodium: 134 mEq/L — ABNORMAL LOW (ref 137–147)
Total Bilirubin: 0.8 mg/dL (ref 0.3–1.2)
Total Protein: 6.2 g/dL (ref 6.0–8.3)

## 2013-04-17 LAB — CBC WITH DIFFERENTIAL/PLATELET
Basophils Absolute: 0 10*3/uL (ref 0.0–0.1)
Basophils Relative: 0 % (ref 0–1)
Eosinophils Absolute: 0 10*3/uL (ref 0.0–0.7)
Eosinophils Relative: 0 % (ref 0–5)
HEMATOCRIT: 36.1 % (ref 36.0–46.0)
Hemoglobin: 12.3 g/dL (ref 12.0–15.0)
LYMPHS PCT: 14 % (ref 12–46)
Lymphs Abs: 1.9 10*3/uL (ref 0.7–4.0)
MCH: 28.5 pg (ref 26.0–34.0)
MCHC: 34.1 g/dL (ref 30.0–36.0)
MCV: 83.6 fL (ref 78.0–100.0)
Monocytes Absolute: 1.1 10*3/uL — ABNORMAL HIGH (ref 0.1–1.0)
Monocytes Relative: 8 % (ref 3–12)
Neutro Abs: 10.3 10*3/uL — ABNORMAL HIGH (ref 1.7–7.7)
Neutrophils Relative %: 78 % — ABNORMAL HIGH (ref 43–77)
Platelets: 220 10*3/uL (ref 150–400)
RBC: 4.32 MIL/uL (ref 3.87–5.11)
RDW: 13 % (ref 11.5–15.5)
WBC: 13.2 10*3/uL — AB (ref 4.0–10.5)

## 2013-04-17 LAB — BASIC METABOLIC PANEL
BUN: 15 mg/dL (ref 6–23)
CHLORIDE: 97 meq/L (ref 96–112)
CO2: 29 meq/L (ref 19–32)
CREATININE: 0.76 mg/dL (ref 0.50–1.10)
Calcium: 9.2 mg/dL (ref 8.4–10.5)
GFR calc Af Amer: >90 mL/min (ref >90)
GFR calc non Af Amer: 78 mL/min — ABNORMAL LOW (ref >90)
Glucose, Bld: 133 mg/dL — ABNORMAL HIGH (ref 70–99)
POTASSIUM: 3.7 meq/L (ref 3.7–5.3)
Sodium: 137 mEq/L (ref 137–147)

## 2013-04-17 LAB — PLATELET INHIBITION P2Y12: Platelet Function  P2Y12: 331 [PRU] (ref 194–418)

## 2013-04-17 LAB — CBC
HCT: 36.9 % (ref 36.0–46.0)
HEMOGLOBIN: 12.7 g/dL (ref 12.0–15.0)
MCH: 28.7 pg (ref 26.0–34.0)
MCHC: 34.4 g/dL (ref 30.0–36.0)
MCV: 83.3 fL (ref 78.0–100.0)
PLATELETS: 188 10*3/uL (ref 150–400)
RBC: 4.43 MIL/uL (ref 3.87–5.11)
RDW: 13 % (ref 11.5–15.5)
WBC: 12.6 10*3/uL — ABNORMAL HIGH (ref 4.0–10.5)

## 2013-04-17 LAB — HEMOGLOBIN A1C
HEMOGLOBIN A1C: 5.9 % — AB (ref <5.7)
MEAN PLASMA GLUCOSE: 123 mg/dL — AB (ref <117)

## 2013-04-17 LAB — TROPONIN I
Troponin I: 6.6 ng/mL (ref <0.30)
Troponin I: 10.59 ng/mL (ref <0.30)

## 2013-04-17 LAB — APTT: aPTT: 85 seconds — ABNORMAL HIGH (ref 24–37)

## 2013-04-17 LAB — HEPARIN LEVEL (UNFRACTIONATED): HEPARIN UNFRACTIONATED: 0.3 [IU]/mL (ref 0.30–0.70)

## 2013-04-17 LAB — PROTIME-INR
INR: 1.05 (ref 0.00–1.49)
PROTHROMBIN TIME: 13.5 s (ref 11.6–15.2)

## 2013-04-17 LAB — MAGNESIUM: MAGNESIUM: 2 mg/dL (ref 1.5–2.5)

## 2013-04-17 LAB — MRSA PCR SCREENING: MRSA BY PCR: NEGATIVE

## 2013-04-17 LAB — GLUCOSE, CAPILLARY: GLUCOSE-CAPILLARY: 115 mg/dL — AB (ref 70–99)

## 2013-04-17 SURGERY — LEFT HEART CATHETERIZATION WITH CORONARY ANGIOGRAM
Anesthesia: LOCAL

## 2013-04-17 MED ORDER — SODIUM CHLORIDE 0.9 % IV BOLUS (SEPSIS)
500.0000 mL | Freq: Once | INTRAVENOUS | Status: AC
  Administered 2013-04-17: 500 mL via INTRAVENOUS

## 2013-04-17 MED ORDER — SODIUM CHLORIDE 0.9 % IJ SOLN: 3.0000 mL | INTRAMUSCULAR | Status: DC | PRN

## 2013-04-17 MED ORDER — MORPHINE SULFATE 2 MG/ML IJ SOLN
1.0000 mg | INTRAMUSCULAR | Status: DC | PRN
  Administered 2013-04-18 – 2013-04-19 (×3): 1 mg via INTRAVENOUS
  Filled 2013-04-17 (×3): qty 1

## 2013-04-17 MED ORDER — ASPIRIN 81 MG PO CHEW: 81.0000 mg | CHEWABLE_TABLET | Freq: Every day | ORAL | Status: DC

## 2013-04-17 MED ORDER — STUDY - INVESTIGATIONAL DRUG SIMPLE RECORD
7.5000 mg | Freq: Two times a day (BID) | Status: DC
  Administered 2013-04-17 – 2013-04-20 (×6): 7.5 mg via ORAL
  Filled 2013-04-17 (×4): qty 7.5

## 2013-04-17 MED ORDER — SODIUM CHLORIDE 0.9 % IV SOLN: INTRAVENOUS | Status: DC

## 2013-04-17 MED ORDER — FENTANYL CITRATE 0.05 MG/ML IJ SOLN
INTRAMUSCULAR | Status: AC
  Filled 2013-04-17: qty 2

## 2013-04-17 MED ORDER — HEPARIN (PORCINE) IN NACL 2-0.9 UNIT/ML-% IJ SOLN
INTRAMUSCULAR | Status: AC
  Filled 2013-04-17: qty 1000

## 2013-04-17 MED ORDER — LIDOCAINE HCL (PF) 1 % IJ SOLN
INTRAMUSCULAR | Status: AC
  Filled 2013-04-17: qty 30

## 2013-04-17 MED ORDER — NITROGLYCERIN 0.2 MG/ML ON CALL CATH LAB
INTRAVENOUS | Status: AC
  Filled 2013-04-17: qty 1

## 2013-04-17 MED ORDER — SODIUM CHLORIDE 0.9 % IJ SOLN
3.0000 mL | Freq: Two times a day (BID) | INTRAMUSCULAR | Status: DC
  Administered 2013-04-17: 23:00:00 via INTRAVENOUS

## 2013-04-17 MED ORDER — SODIUM CHLORIDE 0.9 % IV SOLN: 250.0000 mL | INTRAVENOUS | Status: DC | PRN

## 2013-04-17 MED ORDER — METOPROLOL TARTRATE 25 MG PO TABS
25.0000 mg | ORAL_TABLET | Freq: Two times a day (BID) | ORAL | Status: DC
  Administered 2013-04-18: 25 mg via ORAL
  Filled 2013-04-17 (×3): qty 1

## 2013-04-17 MED ORDER — HEPARIN (PORCINE) IN NACL 100-0.45 UNIT/ML-% IJ SOLN
850.0000 [IU]/h | INTRAMUSCULAR | Status: DC
  Filled 2013-04-17: qty 250

## 2013-04-17 MED ORDER — TIROFIBAN HCL IV 5 MG/100ML
0.1500 ug/kg/min | INTRAVENOUS | Status: DC
  Administered 2013-04-17: 0.15 ug/kg/min via INTRAVENOUS
  Filled 2013-04-17 (×3): qty 100

## 2013-04-17 MED ORDER — NITROGLYCERIN IN D5W 200-5 MCG/ML-% IV SOLN
INTRAVENOUS | Status: AC
  Filled 2013-04-17: qty 250

## 2013-04-17 MED ORDER — MORPHINE SULFATE 2 MG/ML IJ SOLN
INTRAMUSCULAR | Status: AC
  Administered 2013-04-17: 2 mg via INTRAVENOUS
  Filled 2013-04-17: qty 1

## 2013-04-17 MED ORDER — ATORVASTATIN CALCIUM 80 MG PO TABS
80.0000 mg | ORAL_TABLET | Freq: Every day | ORAL | Status: DC
  Administered 2013-04-18: 80 mg via ORAL
  Filled 2013-04-17 (×2): qty 1

## 2013-04-17 MED ORDER — TIROFIBAN HCL IV 5 MG/100ML
0.1500 ug/kg/min | INTRAVENOUS | Status: DC
  Administered 2013-04-17 – 2013-04-18 (×2): 0.15 ug/kg/min via INTRAVENOUS
  Filled 2013-04-17 (×5): qty 100

## 2013-04-17 NOTE — Progress Notes (Addendum)
ANTICOAGULATION CONSULT NOTE - Follow Up Consult  Pharmacy Consult for Heparin Indication: possible CABG- restart 4 hrs post-sheath, no bolus  Allergies  Allergen Reactions  . Sulfonamide Derivatives Other (See Comments)    REACTION: childhood unknown type, ended up in hospital    Patient Measurements: Height: 5\' 5"  (165.1 cm) Weight: 185 lb 3 oz (84 kg) IBW/kg (Calculated) : 57 Heparin Dosing Weight: 73 kg  Vital Signs: Temp: 98.7 F (37.1 C) (02/16 1200) Temp src: Oral (02/16 1200) BP: 109/41 mmHg (02/16 1902) Pulse Rate: 57 (02/16 1557)  Labs:  Recent Labs  04/16/13 2009 04/16/13 2157 04/16/13 2330 04/17/13 0322 04/17/13 0548 04/17/13 1000  HGB 13.2  --  12.3 12.7  --   --   HCT 37.9  --  36.1 36.9  --   --   PLT 203  --  220 188  --   --   APTT  --   --  85*  --   --   --   LABPROT  --  12.9 13.5  --   --   --   INR  --  0.99 1.05  --   --   --   HEPARINUNFRC  --   --   --   --  0.30  --   CREATININE 0.70  --  0.72  --   --  0.76  TROPONINI  --  11.24*  --  6.60*  --  10.59*    Estimated Creatinine Clearance: 61 ml/min (by C-G formula based on Cr of 0.76).  Assessment: 44 YOF known to [place] prior to cath for IV heparin now to resume heparin 4 hours post-sheath removal without a bolus. Plan for possible CABG. Sheath was pulled 4:35pm per Dr. Kennon Holter note. Prior to cath, heparin level was therapeutic at 850 units/hr.   Goal of Therapy:  Heparin level 0.3-0.7 units/ml Monitor platelets by anticoagulation protocol: Yes   Plan:  1. Restart heparin at 850 units/hr at 2200.  2. No bolus. 3. Heparin level 8 hours after restart. - ok to do with AM labs.  4. Daily heparin level and CBC while on therapy.   Sloan Leiter, PharmD, BCPS Clinical Pharmacist [phone] 04/17/2013,9:01 PM  Addendum:  tirofiban to continue per pharmacy x1 day until [place].  Plan: Continue tirofiban at current rate of 0.60mcg/kg/min (CrCL 76mL/min) x24 hrs until PCTA tomorrow.    Sloan Leiter, PharmD, BCPS Clinical Pharmacist [phone] 04/17/2013, 9:27 PM

## 2013-04-17 NOTE — Progress Notes (Signed)
ANTICOAGULATION CONSULT NOTE - Initial Consult  Pharmacy Consult for Heparin Indication: chest pain/ACS  Allergies  Allergen Reactions  . Sulfonamide Derivatives Other (See Comments)    REACTION: childhood unknown type, ended up in hospital    Patient Measurements: Height: 5\' 5"  (165.1 cm) Weight: 185 lb 3 oz (84 kg) IBW/kg (Calculated) : 57 Heparin Dosing Weight: 73 kg   Vital Signs: Temp: 98.7 F (37.1 C) (02/16 0400) Temp src: Oral (02/16 0400) BP: 102/35 mmHg (02/16 0534) Pulse Rate: 57 (02/16 0534)  Labs:  Recent Labs  04/16/13 2009 04/16/13 2157 04/16/13 2330 04/17/13 0322 04/17/13 0548  HGB 13.2  --  12.3 12.7  --   HCT 37.9  --  36.1 36.9  --   PLT 203  --  220 188  --   APTT  --   --  85*  --   --   LABPROT  --  12.9 13.5  --   --   INR  --  0.99 1.05  --   --   HEPARINUNFRC  --   --   --   --  0.30  CREATININE 0.70  --  0.72  --   --   TROPONINI  --  11.24*  --  6.60*  --     Estimated Creatinine Clearance: 61 ml/min (by C-G formula based on Cr of 0.72).   Medical History: Past Medical History  Diagnosis Date  . Diabetes mellitus   . Hypertension   . Fibrocystic breast disease   . Meningioma   . Tendon tear, ankle     Medications:  Prescriptions prior to admission  Medication Sig Dispense Refill  . aspirin 81 MG tablet Take 81 mg by mouth daily.        Marland Kitchen atenolol (TENORMIN) 100 MG tablet Take 100 mg by mouth every evening.      . bismuth subsalicylate (PEPTO BISMOL) 262 MG/15ML suspension Take 30 mLs by mouth every 6 (six) hours as needed for indigestion.      . calcium carbonate (TUMS - DOSED IN MG ELEMENTAL CALCIUM) 500 MG chewable tablet Chew 1 tablet by mouth daily as needed for indigestion or heartburn.      . Nutritional Supplements (HIGH-PROTEIN NUTRITIONAL SHAKE) LIQD Take 60 mLs by mouth daily as needed (for supplement).      . olmesartan-hydrochlorothiazide (BENICAR HCT) 40-12.5 MG per tablet Take 1 tablet by mouth daily.  30 tablet   6    Assessment: 81 YOF on heparin for chest pain/NSTEMI.  Heparin level therapeutic this morning at 0.3. CBC remains stable.  Troponin trending down. Patient is planned to go to cath today.   Goal of Therapy:  Heparin level 0.3-0.7 units/ml Monitor platelets by anticoagulation protocol: Yes   Plan:  Continue heparin infusion at 850 units/hr Monitor H&H and platelets F/U anticoagulation plans after cath today  Ronal Maybury C. Luisfelipe Engelstad, PharmD Clinical Pharmacist-Resident Pager: [phone] Pharmacy: [phone] 04/17/2013 7:44 AM

## 2013-04-17 NOTE — Progress Notes (Signed)
Critical Troponin level: 11.24  Dr. Radford Pax notified. Order to transfer to step down unit was placed. Will Continue to Monitor.  Domingo Dimes RN

## 2013-04-17 NOTE — Progress Notes (Signed)
Patient and family were informed of transfer. Patient's belongings were packed up. Patient was transferred to unit 2 Heart.   Domingo Dimes RN

## 2013-04-17 NOTE — Progress Notes (Signed)
Subjective: Saturday pt had chest/epigastric pain and a lot of belching thought related to heartburn.  She felt weak the rest of the day and continued to have mild discomfort.  She felt weak and tired 2/15 and had left chest pain epigastric discomfort as well around 7:30 am.  She took 1/2 of full dose Aspirin.  Daughter states pt stated pain radiated to her neck, jaw.  On admission elevated trop  RN: plan right wrist cath, SB in the 50s  Objective: Vital signs in last 24 hours: Filed Vitals:   04/17/13 0100 04/17/13 0400 04/17/13 0500 04/17/13 0534  BP: 103/43 95/28 89/31  102/35  Pulse: 51 50 52 57  Temp:  98.7 F (37.1 C)    TempSrc:  Oral    Resp: 20 20 20 23   Height:      Weight:      SpO2: 93% 94% 91% 95%   Weight change:   Intake/Output Summary (Last 24 hours) at 04/17/13 0959 Last data filed at 04/17/13 0500  Gross per 24 hour  Intake    143 ml  Output      0 ml  Net    143 ml   Vitals reviewed. General: resting in bed, NAD HEENT: [place]/at, no scleral icterus Cardiac: SB, no rubs, murmurs or gallops Pulm: clear to auscultation bilaterally, no wheezes, rales, or rhonchi Abd: soft, nontender, nondistended, BS present, obese  Ext: warm and well perfused, no pedal edema Neuro: alert and oriented X3, cranial nerves II-XII grossly intact, moving all 4 extremities   Lab Results: Basic Metabolic Panel:  Recent Labs Lab 04/16/13 2009 04/16/13 2330  NA 136* 134*  K 4.1 3.6*  CL 96 94*  CO2 28 27  GLUCOSE 127* 169*  BUN 16 16  CREATININE 0.70 0.72  CALCIUM 9.8 9.2   Liver Function Tests:  Recent Labs Lab 04/16/13 2330  AST 78*  ALT 27  ALKPHOS 72  BILITOT 0.8  PROT 6.2  ALBUMIN 3.3*    CBC:  Recent Labs Lab 04/16/13 2009 04/16/13 2330 04/17/13 0322  WBC 13.8* 13.2* 12.6*  NEUTROABS 11.1* 10.3*  --   HGB 13.2 12.3 12.7  HCT 37.9 36.1 36.9  MCV 82.9 83.6 83.3  PLT 203 220 188   Cardiac Enzymes:  Recent Labs Lab 04/16/13 2157 04/17/13 0322   TROPONINI 11.24* 6.60*   Hemoglobin A1C:  Recent Labs Lab 04/16/13 2157  HGBA1C 5.9*   Fasting Lipid Panel: Lipid Panel     Component Value Date/Time   CHOL 298* 04/05/2013 1106   TRIG 274.0* 04/05/2013 1106   HDL 42.50 04/05/2013 1106   CHOLHDL 7 04/05/2013 1106   VLDL 54.8* 04/05/2013 1106   LDLCALC 156* 09/13/2010 0214   Coagulation:  Recent Labs Lab 04/16/13 2157 04/16/13 2330  LABPROT 12.9 13.5  INR 0.99 1.05   Misc. Labs: Platelet Function P2Y12 331      Micro Results: Recent Results (from the past 240 hour(s))  MRSA PCR SCREENING     Status: None   Collection Time    04/17/13 12:18 AM      Result Value Ref Range Status   MRSA by PCR NEGATIVE  NEGATIVE Final   Comment:            The GeneXpert MRSA Assay (FDA     approved for NASAL specimens     only), is one component of a     comprehensive MRSA colonization     surveillance program. It is not  intended to diagnose MRSA     infection nor to guide or     monitor treatment for     MRSA infections.   Studies/Results: Dg Chest 2 View  04/16/2013   CLINICAL DATA:  Chest pain and hypertension.  EXAM: CHEST  2 VIEW  COMPARISON:  None.  FINDINGS: Heart size is mildly enlarged. Lungs clear. No pneumothorax pleural effusion. No focal bony abnormality.  IMPRESSION: No acute disease.   Electronically Signed   By: Inge Rise M.D.   On: 04/16/2013 20:50   Medications: Medications Prior to Admission  Medication Sig Dispense Refill  . aspirin 81 MG tablet Take 81 mg by mouth daily.        Marland Kitchen atenolol (TENORMIN) 100 MG tablet Take 100 mg by mouth every evening.      . bismuth subsalicylate (PEPTO BISMOL) 262 MG/15ML suspension Take 30 mLs by mouth every 6 (six) hours as needed for indigestion.      . calcium carbonate (TUMS - DOSED IN MG ELEMENTAL CALCIUM) 500 MG chewable tablet Chew 1 tablet by mouth daily as needed for indigestion or heartburn.      . Nutritional Supplements (HIGH-PROTEIN NUTRITIONAL SHAKE) LIQD  Take 60 mLs by mouth daily as needed (for supplement).      . olmesartan-hydrochlorothiazide (BENICAR HCT) 40-12.5 MG per tablet Take 1 tablet by mouth daily.  30 tablet  6   Scheduled Meds: . aspirin  81 mg Oral Daily  . [START ON 04/18/2013] atorvastatin  80 mg Oral Q0600  . metoprolol tartrate  25 mg Oral BID  . sodium chloride  3 mL Intravenous Q12H   Continuous Infusions: . sodium chloride 75 mL/hr at 04/17/13 0400  . heparin 850 Units/hr (04/16/13 2141)  . nitroGLYCERIN     PRN Meds:.sodium chloride, nitroGLYCERIN, sodium chloride Assessment/Plan: 80 y.o PMH DM, HTN, fibrocystic breast disease, meningioma, FH heart disease father presented 2/15 for chest pain which started 2/14 but patient thought it was related to heartburn.    #NSTEMI (non-ST elevated myocardial infarction)   Ref. Range 04/16/2013 20:16 04/16/2013 21:57 04/17/2013 03:22  Troponin I Latest Range: <0.30 ng/mL  11.24 (HH) 6.60 (HH)  Troponin i, poc Latest Range: 0.00-0.08 ng/mL 5.18 (HH)     -Heparin gtt, Asprin 81 mg qd, Lipitor 80 mg, will d/c Avapro d/t soft pressors (Resume when able to tolerate), d/c Atenolol 100 mg and adde Lopressor 25 mg bid, prn NTG, pt may benefit for Toprol XL or Coreg instead of atenolol -monitor tele  -SB to 52 at Urgent care prior to admission with ST depression in 1, 2, V2-6, TWI 2, 3, AVF V3-6 -echo pending read -pending cath 2/15    #Bradycardia  -monitor via tele   #HTN -recent history of hypotension 90s/50s PTA -Atenolol 100 mg will d/c change to Lopressor 25 mg bid, Avapro 300 mg will d/c d/t soft BP. Resume when able to tolerate -monitor BP   #DM history (HA1C 5.9)  -monitor cbgs   #HLD    Component Value Date/Time   CHOL 298* 04/05/2013 1106   TRIG 274.0* 04/05/2013 1106   HDL 42.50 04/05/2013 1106   CHOLHDL 7 04/05/2013 1106   VLDL 54.8* 04/05/2013 1106   LDLCALC 156* 09/13/2010 0214   -continue statin   #DVT px  -heparin gtt   #F/E/N -NS 75 cc/hr  -K 3.6 2/15  pending BMET, Mag -NPO for cath then advance   PCP Dr. Benay Pillow    LOS: 1 day  Cresenciano Genre, MD [phone] 04/17/2013, 9:59 AM  Patient seen and examined and history reviewed. Agree with above findings and plan. Patient denies any current chest pain. Depressed affect. Ecg and troponins confirm NSTEMI. On ASA, beta blocker, IV heparin. Will hold ARB due to hypotension. Change atenolol to metoprolol. For cardiac cath +/- PCI today. The procedure and risks were reviewed including but not limited to death, myocardial infarction, stroke, arrythmias, bleeding, transfusion, emergency surgery, dye allergy, or renal dysfunction. The patient voices understanding and is agreeable to proceed.Marland Kitchen  Collier Salina [facility] 04/17/2013 11:33 AM

## 2013-04-17 NOTE — CV Procedure (Signed)
[name] is a 80 y.o. female    [id number] LOCATION:  FACILITY: [facility]  PHYSICIAN: Quay Burow, M.D. Mar 30, 1931   DATE OF PROCEDURE:  04/17/2013  DATE OF DISCHARGE:     CARDIAC CATHETERIZATION     History obtained from chart review. This document is a 80 year old Caucasian female who is the mother of [name], patient of mine. She was admitted yesterday with chest pain and ruled in for a non-STEMI. She has a history of hypertension and diabetes. She presented now for diagnostic coronary arteriography to define her anatomy   PROCEDURE DESCRIPTION:   The patient was brought to the second floor [place] [facility]iac cath lab in the postabsorptive state. She was premedicated with Valium 5 mg by mouth and IV fentanyl. Her right groinwas prepped and shaved in usual sterile fashion. Xylocaine 1% was used for local anesthesia. A 5 French sheath was inserted into the right common femoral  artery using standard Seldinger technique.5 French right and left Judkins diagnostic catheters along with a 5 French pigtail catheter were used for selective coronary angiography, left ventriculography and selective left internal mammary artery angiography. Visipaque dye was used for the entirety of the case (50 cc administered the patient). Retrograde aorta, left ventricular pullback pressures were recorded.   HEMODYNAMICS:    AO SYSTOLIC/AO DIASTOLIC: 865/78   LV SYSTOLIC/LV DIASTOLIC: 469/62  ANGIOGRAPHIC RESULTS:   1. Left main; normal  2. LAD; none Present ulcerated proximal plaque, 90% mid LAD 3. Left circumflex; nondominant and free of significant disease.  4. Right coronary artery; dominant. This was the infarct related vessel and it was occluded in the midportion. There were grade 3 (collaterals. 5.LIMA was subselectively visualized and mildly patent. It was suitable for use during coronary bypass grafting if necessary 7. Left ventriculography; RAO left ventriculogram was performed  using  25 mL of Visipaque dye at 12 mL/second. The overall LVEF estimated  45-50 %  With wall motion abnormalities notable for moderate inferobasal hypokinesia  IMPRESSION:[name] has two-vessel disease with a fluted dominant right ventricular infarct related artery. She has grade 3 with left to right collaterals with a high-grade ulcerated proximal LAD lesion as well as a mid LAD lesion. She has moderate LV dysfunction with inferobasal hypokinesia and an EF of 45-50%. I believe the best therapy would be coronary artery bypass grafting. TCTS has been notified. An ACT was measured and the sheath was removed. Pressure was held on the groin to achieve hemostasis. The patient left the Cath Lab in stable condition.  Lorretta Harp MD, [facility] 04/17/2013 4:35 PM

## 2013-04-17 NOTE — Progress Notes (Signed)
Report called to unit 2900.   Domingo Dimes RN

## 2013-04-17 NOTE — H&P (View-Only) (Signed)
Subjective: Saturday pt had chest/epigastric pain and a lot of belching thought related to heartburn.  She felt weak the rest of the day and continued to have mild discomfort.  She felt weak and tired 2/15 and had left chest pain epigastric discomfort as well around 7:30 am.  She took 1/2 of full dose Aspirin.  Daughter states pt stated pain radiated to her neck, jaw.  On admission elevated trop  RN: plan right wrist cath, SB in the 50s  Objective: Vital signs in last 24 hours: Filed Vitals:   04/17/13 0100 04/17/13 0400 04/17/13 0500 04/17/13 0534  BP: 103/43 95/28 89/31  102/35  Pulse: 51 50 52 57  Temp:  98.7 F (37.1 C)    TempSrc:  Oral    Resp: 20 20 20 23   Height:      Weight:      SpO2: 93% 94% 91% 95%   Weight change:   Intake/Output Summary (Last 24 hours) at 04/17/13 0959 Last data filed at 04/17/13 0500  Gross per 24 hour  Intake    143 ml  Output      0 ml  Net    143 ml   Vitals reviewed. General: resting in bed, NAD HEENT: [place]/at, no scleral icterus Cardiac: SB, no rubs, murmurs or gallops Pulm: clear to auscultation bilaterally, no wheezes, rales, or rhonchi Abd: soft, nontender, nondistended, BS present, obese  Ext: warm and well perfused, no pedal edema Neuro: alert and oriented X3, cranial nerves II-XII grossly intact, moving all 4 extremities   Lab Results: Basic Metabolic Panel:  Recent Labs Lab 04/16/13 2009 04/16/13 2330  NA 136* 134*  K 4.1 3.6*  CL 96 94*  CO2 28 27  GLUCOSE 127* 169*  BUN 16 16  CREATININE 0.70 0.72  CALCIUM 9.8 9.2   Liver Function Tests:  Recent Labs Lab 04/16/13 2330  AST 78*  ALT 27  ALKPHOS 72  BILITOT 0.8  PROT 6.2  ALBUMIN 3.3*    CBC:  Recent Labs Lab 04/16/13 2009 04/16/13 2330 04/17/13 0322  WBC 13.8* 13.2* 12.6*  NEUTROABS 11.1* 10.3*  --   HGB 13.2 12.3 12.7  HCT 37.9 36.1 36.9  MCV 82.9 83.6 83.3  PLT 203 220 188   Cardiac Enzymes:  Recent Labs Lab 04/16/13 2157 04/17/13 0322   TROPONINI 11.24* 6.60*   Hemoglobin A1C:  Recent Labs Lab 04/16/13 2157  HGBA1C 5.9*   Fasting Lipid Panel: Lipid Panel     Component Value Date/Time   CHOL 298* 04/05/2013 1106   TRIG 274.0* 04/05/2013 1106   HDL 42.50 04/05/2013 1106   CHOLHDL 7 04/05/2013 1106   VLDL 54.8* 04/05/2013 1106   LDLCALC 156* 09/13/2010 0214   Coagulation:  Recent Labs Lab 04/16/13 2157 04/16/13 2330  LABPROT 12.9 13.5  INR 0.99 1.05   Misc. Labs: Platelet Function P2Y12 331      Micro Results: Recent Results (from the past 240 hour(s))  MRSA PCR SCREENING     Status: None   Collection Time    04/17/13 12:18 AM      Result Value Ref Range Status   MRSA by PCR NEGATIVE  NEGATIVE Final   Comment:            The GeneXpert MRSA Assay (FDA     approved for NASAL specimens     only), is one component of a     comprehensive MRSA colonization     surveillance program. It is not  intended to diagnose MRSA     infection nor to guide or     monitor treatment for     MRSA infections.   Studies/Results: Dg Chest 2 View  04/16/2013   CLINICAL DATA:  Chest pain and hypertension.  EXAM: CHEST  2 VIEW  COMPARISON:  None.  FINDINGS: Heart size is mildly enlarged. Lungs clear. No pneumothorax pleural effusion. No focal bony abnormality.  IMPRESSION: No acute disease.   Electronically Signed   By: Inge Rise M.D.   On: 04/16/2013 20:50   Medications: Medications Prior to Admission  Medication Sig Dispense Refill  . aspirin 81 MG tablet Take 81 mg by mouth daily.        Marland Kitchen atenolol (TENORMIN) 100 MG tablet Take 100 mg by mouth every evening.      . bismuth subsalicylate (PEPTO BISMOL) 262 MG/15ML suspension Take 30 mLs by mouth every 6 (six) hours as needed for indigestion.      . calcium carbonate (TUMS - DOSED IN MG ELEMENTAL CALCIUM) 500 MG chewable tablet Chew 1 tablet by mouth daily as needed for indigestion or heartburn.      . Nutritional Supplements (HIGH-PROTEIN NUTRITIONAL SHAKE) LIQD  Take 60 mLs by mouth daily as needed (for supplement).      . olmesartan-hydrochlorothiazide (BENICAR HCT) 40-12.5 MG per tablet Take 1 tablet by mouth daily.  30 tablet  6   Scheduled Meds: . aspirin  81 mg Oral Daily  . [START ON 04/18/2013] atorvastatin  80 mg Oral Q0600  . metoprolol tartrate  25 mg Oral BID  . sodium chloride  3 mL Intravenous Q12H   Continuous Infusions: . sodium chloride 75 mL/hr at 04/17/13 0400  . heparin 850 Units/hr (04/16/13 2141)  . nitroGLYCERIN     PRN Meds:.sodium chloride, nitroGLYCERIN, sodium chloride Assessment/Plan: 80 y.o PMH DM, HTN, fibrocystic breast disease, meningioma, FH heart disease father presented 2/15 for chest pain which started 2/14 but patient thought it was related to heartburn.    #NSTEMI (non-ST elevated myocardial infarction)   Ref. Range 04/16/2013 20:16 04/16/2013 21:57 04/17/2013 03:22  Troponin I Latest Range: <0.30 ng/mL  11.24 (HH) 6.60 (HH)  Troponin i, poc Latest Range: 0.00-0.08 ng/mL 5.18 (HH)     -Heparin gtt, Asprin 81 mg qd, Lipitor 80 mg, will d/c Avapro d/t soft pressors (Resume when able to tolerate), d/c Atenolol 100 mg and adde Lopressor 25 mg bid, prn NTG, pt may benefit for Toprol XL or Coreg instead of atenolol -monitor tele  -SB to 52 at Urgent care prior to admission with ST depression in 1, 2, V2-6, TWI 2, 3, AVF V3-6 -echo pending read -pending cath 2/15    #Bradycardia  -monitor via tele   #HTN -recent history of hypotension 90s/50s PTA -Atenolol 100 mg will d/c change to Lopressor 25 mg bid, Avapro 300 mg will d/c d/t soft BP. Resume when able to tolerate -monitor BP   #DM history (HA1C 5.9)  -monitor cbgs   #HLD    Component Value Date/Time   CHOL 298* 04/05/2013 1106   TRIG 274.0* 04/05/2013 1106   HDL 42.50 04/05/2013 1106   CHOLHDL 7 04/05/2013 1106   VLDL 54.8* 04/05/2013 1106   LDLCALC 156* 09/13/2010 0214   -continue statin   #DVT px  -heparin gtt   #F/E/N -NS 75 cc/hr  -K 3.6 2/15  pending BMET, Mag -NPO for cath then advance   PCP Dr. Benay Pillow    LOS: 1 day  Cresenciano Genre, MD [phone] 04/17/2013, 9:59 AM  Patient seen and examined and history reviewed. Agree with above findings and plan. Patient denies any current chest pain. Depressed affect. Ecg and troponins confirm NSTEMI. On ASA, beta blocker, IV heparin. Will hold ARB due to hypotension. Change atenolol to metoprolol. For cardiac cath +/- PCI today. The procedure and risks were reviewed including but not limited to death, myocardial infarction, stroke, arrythmias, bleeding, transfusion, emergency surgery, dye allergy, or renal dysfunction. The patient voices understanding and is agreeable to proceed.Marland Kitchen  Collier Salina [facility] 04/17/2013 11:33 AM

## 2013-04-17 NOTE — Consult Note (Signed)
CARDIOLOGY CONSULT NOTE  Patient ID: [name] MRN: [id number] DOB/AGE: [birth year] 80 y.o.  Admit date: 04/16/2013 Referring Physician  Patient self referred Primary Physician:  Georgetta Haber, MD Reason for Consultation  Second opinion  HPI: Mrs. [name] is a 80 year old Caucasian female with history of hypertension, diabetes mellitus, who was admitted to the hospital 2 days ago with non-ST elevation myocardial infarction.  She started having chest pain that started on Friday, described as heaviness in the chest and burning sensation in the chest and upper abdomen.  She had it on and off on Friday and also Saturday, eventually presented to the hospital on 04/16/2013 with abnormal EKG, positive cardiac markers for non-ST elevation myocardial infarction.  She underwent coronary angiography this evening by Dr. Quay Burow and was found to have severe two-vessel coronary artery disease with the culprit vessel RCA which is occluded and collateralized by the LAD which has an ulcerated complex mid LAD stenosis and mid to distal LAD high-grade stenosis.  The patient was recommended CABG.  I was called by the family to do a second opinion as family was reluctant for CABG and also I had taken care off one of the daughters in the past and and no other family members.  I met the patient in the cardiac catheterization recovery area.  She was complaining of right upper shoulder pain that started as she was rolling into the catheter lab and states that this is persistent and thinks that there is something wrong.  Otherwise no other specific complaints, states that the chest burning sensation that she initially presented with has now subsided.  She does not give any history of GI bleed in the past, no surgeries planned in the recent future.  She denies symptoms of TIA or claudication.  She has history of meningioma that is chronic and stable by MRI in 2012.  Past Medical History  Diagnosis  Date  . Diabetes mellitus   . Hypertension   . Fibrocystic breast disease   . Meningioma   . Tendon tear, ankle      Past Surgical History  Procedure Laterality Date  . Breast lumpectomy    . Tonsillectomy    . Tendon tear    . Colonoscopy      05/25/2000. Results: Normal. Hemorrhoids     Family History  Problem Relation Age of Onset  . Cancer Mother   . Heart disease Father   . Stroke Father      Social History: History   Social History  . Marital Status: Widowed    Spouse Name: N/A    Number of Children: N/A  . Years of Education: N/A   Occupational History  . Not on file.   Social History Main Topics  . Smoking status: Never Smoker   . Smokeless tobacco: Not on file  . Alcohol Use: No  . Drug Use: No  . Sexual Activity: No   Other Topics Concern  . Not on file   Social History Narrative  . No narrative on file     Prescriptions prior to admission  Medication Sig Dispense Refill  . aspirin 81 MG tablet Take 81 mg by mouth daily.        Marland Kitchen atenolol (TENORMIN) 100 MG tablet Take 100 mg by mouth every evening.      . bismuth subsalicylate (PEPTO BISMOL) 262 MG/15ML suspension Take 30 mLs by mouth every 6 (six) hours as needed for indigestion.      Marland Kitchen  calcium carbonate (TUMS - DOSED IN MG ELEMENTAL CALCIUM) 500 MG chewable tablet Chew 1 tablet by mouth daily as needed for indigestion or heartburn.      . Nutritional Supplements (HIGH-PROTEIN NUTRITIONAL SHAKE) LIQD Take 60 mLs by mouth daily as needed (for supplement).      . olmesartan-hydrochlorothiazide (BENICAR HCT) 40-12.5 MG per tablet Take 1 tablet by mouth daily.  30 tablet  6    Scheduled Meds: . aspirin  81 mg Oral Daily  . [START ON 04/18/2013] atorvastatin  80 mg Oral QPC breakfast  . Latitude-TIMI study- Placebo / Losmapimod  (PI- Stuckey )  7.5 mg Oral BID  . metoprolol tartrate  25 mg Oral BID   Continuous Infusions: . sodium chloride 75 mL/hr at 04/17/13 1900  . [START ON 04/18/2013]  sodium chloride    . heparin    . nitroGLYCERIN    . tirofiban     PRN Meds:.morphine injection, nitroGLYCERIN  ROS: General: no fevers/chills/night sweats Eyes: no blurry vision, diplopia, or amaurosis ENT: no sore throat or hearing loss Resp: no cough, wheezing, or hemoptysis CV: no edema or palpitations GI: no abdominal pain, nausea, vomiting, diarrhea, or constipation GU: no dysuria, frequency, or hematuria Skin: no rash Neuro: no headache, numbness, tingling, or weakness of extremities Heme: no bleeding, DVT, or easy bruising Endo: no polydipsia or polyuria. Other systems negative    Physical Exam: Blood pressure 109/41, pulse 57, temperature 98.7 F (37.1 C), temperature source Oral, resp. rate 17, height _0  (1.651 m), weight 84 kg (185 lb 3 oz), SpO2 97.00%.   General appearance: alert, cooperative, appears stated age, no distress and moderately obese Lungs: clear to auscultation bilaterally Chest wall: no tenderness Heart: regular rate and rhythm, S1, S2 normal, no murmur, click, rub or gallop Abdomen: soft, non-tender; bowel sounds normal; no masses,  no organomegaly Extremities: extremities normal, atraumatic, no cyanosis or edema Pulses: 2+ and symmetric  Labs:   Lab Results  Component Value Date   WBC 12.6* 04/17/2013   HGB 12.7 04/17/2013   HCT 36.9 04/17/2013   MCV 83.3 04/17/2013   PLT 188 04/17/2013    Recent Labs Lab 04/16/13 2330 04/17/13 1000  NA 134* 137  K 3.6* 3.7  CL 94* 97  CO2 27 29  BUN 16 15  CREATININE 0.72 0.76  CALCIUM 9.2 9.2  PROT 6.2  --   BILITOT 0.8  --   ALKPHOS 72  --   ALT 27  --   AST 78*  --   GLUCOSE 169* 133*   Lab Results  Component Value Date   CKTOTAL 38 09/13/2010   CKMB 1.2 09/13/2010   TROPONINI 10.59* 04/17/2013    Lipid Panel     Component Value Date/Time   CHOL 298* 04/05/2013 1106   TRIG 274.0* 04/05/2013 1106   HDL 42.50 04/05/2013 1106   CHOLHDL 7 04/05/2013 1106   VLDL 54.8* 04/05/2013 1106   LDLCALC  156* 09/13/2010 0214   Cardiac Panel (last 3 results)  Recent Labs  04/16/13 2157 04/17/13 0322 04/17/13 1000  TROPONINI 11.24* 6.60* 10.59*    EKG:04/16/2013: Sinus bradycardia at the rate of 52 bpm, left axis deviation, cannot exclude inferior infarct, subacute, minimal nonspecific coving of the ST segment inferiorly, deep T wave inversion in the inferior leads suggest non-ST elevation myocardial infarction, T inversion in anterolateral leads suggest anterolateral ischemia.   Radiology: Dg Chest 2 View  04/16/2013   CLINICAL DATA:  Chest pain and hypertension.  EXAM: CHEST  2 VIEW  COMPARISON:  None.  FINDINGS: Heart size is mildly enlarged. Lungs clear. No pneumothorax pleural effusion. No focal bony abnormality.  IMPRESSION: No acute disease.   Electronically Signed   By: Inge Rise M.D.   On: 04/16/2013 20:50   Echocardiogram 04/17/2013: Normal left ventricle systolic function, ejection fraction 65 to 70%, no significant valvular abnormalities, IVC was dilated with blunted respiratory variation consistent with elevated central venous pressure   ASSESSMENT AND PLAN:  1.  Non-ST elevation myocardial infarction, angiogram performed this evening consistent with inferior wall non-ST elevation myocardial infarction with occluded right coronary artery. 2.Severe native vessel coronary artery disease involving mild to moderate diffuse disease in the LAD and RCA, LAD revealing complex ulcerated mid LAD stenosis followed by mid to distal high-grade 90% stenosis.  Right coronary artery is occluded and collateralized by the left system, ejection fraction 45% with inferior akinesis. 3.  Diabetes mellitus, diet controlled. HbA1c 5.9% this admission 4.  Mixed hyperlipidemia 5.  Hypertension  Recommendation: I had a very lengthy discussion with the patient and also all the family members including 3 daughters and sons.  I discussed the coronary anatomy including the fact that both PCI and CABG are  option.  I also discussed with them regarding the risks, benefits and alternatives to CABG and PCI, discussed the risks of PCI that includes but not limited to less than 1% risk of death, stroke, MI, need for urgent CABG but not limited to these and also renal failure given her age.  I also discussed with them that she has at least equal if not more risk of the same With CABG along with need for recovery time.  Patient has completed inferior infarct, but the inferior wall is probably viable as she has developed collaterals.  At this point I have discussed with them proceeding with angioplasty would be acceptable, with eye towards revascularization of the LAD first followed by RCA at the same setting if possible as LAD gives collaterals to the right coronary artery.  She has had an MI at least 2 days ago with troponin positive at 10 pm yesterday on presentation, hence CABG would essentially salvage LAD which is potentially easily amenable for percutaneous revascularization.  I also advised them that they can certainly make up their own mind and can have their own discussions and opinions and let me know whether they would likely to take over the care and proceed with angiography in the morning versus continuing the present care.  They were all in agreement that they would like me to try PCI and wanted to proceed with the same.  I will start the patient on Integrilin tonight as the upper back pain that she is complaining about could also be anginal equivalent as it just started this evening, also the troponin has risen compared to earlier this morning.  I'll continue to monitor the serum troponins, and schedule her for PCI in the morning.   Laverda Page, MD 04/17/2013, 9:15 PM [facility]. [place] Pager: [phone] Office: [phone] If no answer Cell [phone]

## 2013-04-17 NOTE — Care Management Note (Addendum)
    Page 1 of 2   04/25/2013     4:42:49 PM   CARE MANAGEMENT NOTE 04/25/2013  Patient:  [name], [name]   Account Number:  [id number]  Date Initiated:  04/17/2013  Documentation initiated by:  Elissa Hefty  Subjective/Objective Assessment:   adm w mi     Action/Plan:   lives alone, pcp dr Benay Pillow   Anticipated DC Date:  04/25/2013   Anticipated DC Plan:  [place]  CM consult  Medication Assistance      Choice offered to / List presented to:     DME arranged  [place]      DME agency  [place] arranged  HH-2 PT      [place].   Status of service:  Completed, signed off Medicare Important Message given?   (If response is "NO", the following Medicare IM given date fields will be blank) Date Medicare IM given:   Date Additional Medicare IM given:    Discharge Disposition:  [place]  Per UR Regulation:  Reviewed for med. necessity/level of care/duration of stay  If discussed at [place] of Stay Meetings, dates discussed:    Comments:  Contact:  [name] Daughter [phone]                 Rodgers,Autumn Daughter [phone]                  Autumn, Rodgers [phone]  04-25-13 11:30am Luz Lex, [place] [phone] PT recommending home with PT.  Phsycian to write orders for HHPT and RW and 3n1.  Talked with family about discharge home.  Would really like rehab, aware Insurance would not support so would likely be self pay.  Opted for going home with PT. Choose [facility] for HHPT - referral called to [facility].  Notified [facility] of equipment at 4:10pm as patient has now decided to go home to today and [facility] is not in network for DME.  Verified recieved fax and have all needed info - updated nurse on floor.  04-24-13 11:55am Luz Lex, [phone] Patient downstairs for xray of foot.  States cannot  walk on today.  Patient lives at home alone but is going home post discharge to daughter [name].  Multiple family members around that assist patient.  At this time do not feel will need any HH but states with the foot they may need something.  CM will continue to follow.   2/18  1005 debbie dowell rn,bsn gave pt 30day free brilinta card.will have $6.60 copay for brilinta.  2/16 1232p debbie dowell rn,bsn pt has managed medicare that should have drug coverage benefits.

## 2013-04-17 NOTE — Progress Notes (Signed)
Echocardiogram 2D Echocardiogram has been performed.  Joelene Millin 04/17/2013, 9:49 AM

## 2013-04-17 NOTE — Interval H&P Note (Signed)
Cath Lab Visit (complete for each Cath Lab visit)  Clinical Evaluation Leading to the Procedure:   ACS: yes  Non-ACS:    Anginal Classification: CCS IV  Anti-ischemic medical therapy: Maximal Therapy (2 or more classes of medications)  Non-Invasive Test Results: No non-invasive testing performed  Prior CABG: No previous CABG      History and Physical Interval Note:  04/17/2013 4:09 PM  [name]  has presented today for surgery, with the diagnosis of nstemi  The various methods of treatment have been discussed with the patient and family. After consideration of risks, benefits and other options for treatment, the patient has consented to  Procedure(s): LEFT HEART CATHETERIZATION WITH CORONARY ANGIOGRAM (N/A) as a surgical intervention .  The patient's history has been reviewed, patient examined, no change in status, stable for surgery.  I have reviewed the patient's chart and labs.  Questions were answered to the patient's satisfaction.     Lorretta Harp

## 2013-04-17 NOTE — Progress Notes (Signed)
Utilization Review Completed.[name] T2/16/2015  

## 2013-04-18 ENCOUNTER — Encounter (HOSPITAL_COMMUNITY): Payer: Self-pay | Admitting: Anesthesiology

## 2013-04-18 ENCOUNTER — Encounter (HOSPITAL_COMMUNITY)
Admission: EM | Disposition: A | Discharge status: Home / Self Care | Payer: Self-pay | Source: Home / Self Care | Attending: Cardiology

## 2013-04-18 ENCOUNTER — Inpatient Hospital Stay (HOSPITAL_COMMUNITY): Payer: Medicare HMO | Admitting: Anesthesiology

## 2013-04-18 ENCOUNTER — Other Ambulatory Visit: Payer: Medicare HMO

## 2013-04-18 ENCOUNTER — Encounter (HOSPITAL_COMMUNITY): Payer: Medicare HMO

## 2013-04-18 ENCOUNTER — Encounter (HOSPITAL_COMMUNITY): Payer: Medicare HMO | Admitting: Anesthesiology

## 2013-04-18 DIAGNOSIS — I2510 Atherosclerotic heart disease of native coronary artery without angina pectoris: Secondary | ICD-10-CM | POA: Insufficient documentation

## 2013-04-18 DIAGNOSIS — I480 Paroxysmal atrial fibrillation: Secondary | ICD-10-CM | POA: Insufficient documentation

## 2013-04-18 HISTORY — PX: CARDIOVERSION: SHX1299

## 2013-04-18 HISTORY — PX: PERCUTANEOUS CORONARY STENT INTERVENTION (PCI-S): SHX5485

## 2013-04-18 LAB — CBC
HEMATOCRIT: 30.6 % — AB (ref 36.0–46.0)
HEMOGLOBIN: 10.5 g/dL — AB (ref 12.0–15.0)
MCH: 28.8 pg (ref 26.0–34.0)
MCHC: 34.3 g/dL (ref 30.0–36.0)
MCV: 83.8 fL (ref 78.0–100.0)
Platelets: 165 10*3/uL (ref 150–400)
RBC: 3.65 MIL/uL — AB (ref 3.87–5.11)
RDW: 13.1 % (ref 11.5–15.5)
WBC: 8.5 10*3/uL (ref 4.0–10.5)

## 2013-04-18 LAB — BLOOD GAS, ARTERIAL
Acid-Base Excess: 1.7 mmol/L (ref 0.0–2.0)
Bicarbonate: 25.5 mEq/L — ABNORMAL HIGH (ref 20.0–24.0)
DRAWN BY: 331761
FIO2: 0.21 %
O2 Saturation: 90.6 %
PATIENT TEMPERATURE: 98.6
PCO2 ART: 38.3 mmHg (ref 35.0–45.0)
TCO2: 26.7 mmol/L (ref 0–100)
pH, Arterial: 7.439 (ref 7.350–7.450)
pO2, Arterial: 58.1 mmHg — ABNORMAL LOW (ref 80.0–100.0)

## 2013-04-18 LAB — BASIC METABOLIC PANEL
BUN: 15 mg/dL (ref 6–23)
CO2: 26 mEq/L (ref 19–32)
Calcium: 8.5 mg/dL (ref 8.4–10.5)
Chloride: 100 mEq/L (ref 96–112)
Creatinine, Ser: 0.7 mg/dL (ref 0.50–1.10)
GFR calc Af Amer: >90 mL/min (ref >90)
GFR calc non Af Amer: 80 mL/min — ABNORMAL LOW (ref >90)
GLUCOSE: 106 mg/dL — AB (ref 70–99)
Potassium: 3.7 mEq/L (ref 3.7–5.3)
Sodium: 138 mEq/L (ref 137–147)

## 2013-04-18 LAB — HEMOGLOBIN A1C
HEMOGLOBIN A1C: 5.8 % — AB (ref <5.7)
Mean Plasma Glucose: 120 mg/dL — ABNORMAL HIGH (ref <117)

## 2013-04-18 LAB — HEPARIN LEVEL (UNFRACTIONATED): Heparin Unfractionated: <0.1 IU/mL — ABNORMAL LOW (ref 0.30–0.70)

## 2013-04-18 LAB — TYPE AND SCREEN
ABO/RH(D): A NEG
ANTIBODY SCREEN: NEGATIVE

## 2013-04-18 LAB — POCT ACTIVATED CLOTTING TIME
ACTIVATED CLOTTING TIME: 376 s
Activated Clotting Time: 110 seconds

## 2013-04-18 LAB — MAGNESIUM: Magnesium: 1.8 mg/dL (ref 1.5–2.5)

## 2013-04-18 LAB — ABO/RH: ABO/RH(D): A NEG

## 2013-04-18 SURGERY — PERCUTANEOUS CORONARY STENT INTERVENTION (PCI-S)
Anesthesia: LOCAL

## 2013-04-18 SURGERY — CARDIOVERSION
Anesthesia: Monitor Anesthesia Care

## 2013-04-18 MED ORDER — HEPARIN (PORCINE) IN NACL 100-0.45 UNIT/ML-% IJ SOLN
1000.0000 [IU]/h | INTRAMUSCULAR | Status: DC
  Administered 2013-04-18: 850 [IU]/h via INTRAVENOUS
  Administered 2013-04-19: 1000 [IU]/h via INTRAVENOUS
  Filled 2013-04-18: qty 250

## 2013-04-18 MED ORDER — FENTANYL CITRATE 0.05 MG/ML IJ SOLN
INTRAMUSCULAR | Status: AC
  Filled 2013-04-18: qty 2

## 2013-04-18 MED ORDER — PHENYLEPHRINE HCL 10 MG/ML IJ SOLN
INTRAMUSCULAR | Status: DC | PRN
  Administered 2013-04-18: 40 ug via INTRAVENOUS

## 2013-04-18 MED ORDER — METOPROLOL TARTRATE 25 MG PO TABS
25.0000 mg | ORAL_TABLET | Freq: Three times a day (TID) | ORAL | Status: DC
Start: 2013-04-18 — End: 2013-04-18
  Administered 2013-04-18: 25 mg via ORAL

## 2013-04-18 MED ORDER — SODIUM CHLORIDE 0.9 % IV SOLN
INTRAVENOUS | Status: DC | PRN
  Administered 2013-04-18: 12:00:00 via INTRAVENOUS

## 2013-04-18 MED ORDER — DILTIAZEM HCL 100 MG IV SOLR
10.0000 mg/h | INTRAVENOUS | Status: DC
  Administered 2013-04-18 (×2): 10 mg/h via INTRAVENOUS

## 2013-04-18 MED ORDER — TICAGRELOR 90 MG PO TABS
ORAL_TABLET | ORAL | Status: AC
  Filled 2013-04-18: qty 2

## 2013-04-18 MED ORDER — TICAGRELOR 90 MG PO TABS
90.0000 mg | ORAL_TABLET | Freq: Two times a day (BID) | ORAL | Status: DC
  Administered 2013-04-18 – 2013-04-19 (×2): 90 mg via ORAL
  Filled 2013-04-18 (×4): qty 1

## 2013-04-18 MED ORDER — LIDOCAINE HCL (PF) 1 % IJ SOLN
INTRAMUSCULAR | Status: AC
  Filled 2013-04-18: qty 30

## 2013-04-18 MED ORDER — BIVALIRUDIN 250 MG IV SOLR
INTRAVENOUS | Status: AC
  Filled 2013-04-18: qty 250

## 2013-04-18 MED ORDER — ALPRAZOLAM 0.25 MG PO TABS
0.5000 mg | ORAL_TABLET | Freq: Three times a day (TID) | ORAL | Status: DC | PRN
  Administered 2013-04-18 – 2013-04-25 (×7): 0.5 mg via ORAL
  Filled 2013-04-18 (×3): qty 1
  Filled 2013-04-18 (×2): qty 2
  Filled 2013-04-18 (×3): qty 1

## 2013-04-18 MED ORDER — METOPROLOL TARTRATE 25 MG PO TABS: 25.0000 mg | ORAL_TABLET | Freq: Three times a day (TID) | ORAL | Status: DC

## 2013-04-18 MED ORDER — DILTIAZEM HCL ER COATED BEADS 120 MG PO CP24
120.0000 mg | ORAL_CAPSULE | Freq: Every day | ORAL | Status: DC
  Administered 2013-04-18 – 2013-04-20 (×3): 120 mg via ORAL
  Filled 2013-04-18 (×5): qty 1

## 2013-04-18 MED ORDER — LIDOCAINE HCL (CARDIAC) 20 MG/ML IV SOLN
INTRAVENOUS | Status: DC | PRN
  Administered 2013-04-18: 20 mg via INTRAVENOUS

## 2013-04-18 MED ORDER — METOPROLOL TARTRATE 50 MG PO TABS
50.0000 mg | ORAL_TABLET | Freq: Two times a day (BID) | ORAL | Status: DC
  Administered 2013-04-19 – 2013-04-20 (×4): 50 mg via ORAL
  Filled 2013-04-18 (×6): qty 1

## 2013-04-18 MED ORDER — NITROGLYCERIN 0.2 MG/ML ON CALL CATH LAB
INTRAVENOUS | Status: AC
  Filled 2013-04-18: qty 1

## 2013-04-18 MED ORDER — HEPARIN (PORCINE) IN NACL 2-0.9 UNIT/ML-% IJ SOLN
INTRAMUSCULAR | Status: AC
Start: 2013-04-18 — End: 2013-04-18
  Filled 2013-04-18: qty 1500

## 2013-04-18 MED ORDER — TIROFIBAN HCL IV 5 MG/100ML
0.1500 ug/kg/min | INTRAVENOUS | Status: AC
  Administered 2013-04-18 (×2): 0.15 ug/kg/min via INTRAVENOUS
  Filled 2013-04-18: qty 100

## 2013-04-18 MED ORDER — ASPIRIN 81 MG PO CHEW: 81.0000 mg | CHEWABLE_TABLET | Freq: Every day | ORAL | Status: DC

## 2013-04-18 MED ORDER — PROPOFOL 10 MG/ML IV BOLUS
INTRAVENOUS | Status: DC | PRN
  Administered 2013-04-18: 70 mg via INTRAVENOUS

## 2013-04-18 NOTE — Preoperative (Signed)
Beta Blockers   Reason not to administer Beta Blockers:Lopressor [phone] today

## 2013-04-18 NOTE — Anesthesia Postprocedure Evaluation (Signed)
  Anesthesia Post-op Note  Patient: [name]  Procedure(s) Performed: Procedure(s): CARDIOVERSION-BEDSIDE (N/A)  Patient Location: ICU  Anesthesia Type:General  Level of Consciousness: awake, alert  and oriented  Airway and Oxygen Therapy: Patient Spontanous Breathing and Patient connected to nasal cannula oxygen  Post-op Pain: mild  Post-op Assessment: Post-op Vital signs reviewed, Patient's Cardiovascular Status Stable, Respiratory Function Stable, Patent Airway and Pain level controlled  Post-op Vital Signs: stable  Complications: No apparent anesthesia complications

## 2013-04-18 NOTE — CV Procedure (Signed)
Direct current cardioversion:  Indication symptomatic A. Fibrillation.  Procedure: Using 50 mg of IV Propofol and 20 IV Lidocaine (for reducing venous pain) for achieving deep sedation, synchronized direct current cardioversion performed. Patient was delivered with 100, 150 and 200 Joules of electricity with unsuccessful attempt to cardioversion  to NSR. Patient tolerated the procedure well. No immediate complication noted.

## 2013-04-18 NOTE — H&P (View-Only) (Signed)
CARDIOLOGY CONSULT NOTE  Patient ID: [name] MRN: [id number] DOB/AGE: [birth year] 80 y.o.  Admit date: 04/16/2013 Referring Physician  Patient self referred Primary Physician:  Georgetta Haber, MD Reason for Consultation  Second opinion  HPI: [name] is a 80 year old Caucasian female with history of hypertension, diabetes mellitus, who was admitted to the hospital 2 days ago with non-ST elevation myocardial infarction.  She started having chest pain that started on Friday, described as heaviness in the chest and burning sensation in the chest and upper abdomen.  She had it on and off on Friday and also Saturday, eventually presented to the hospital on 04/16/2013 with abnormal EKG, positive cardiac markers for non-ST elevation myocardial infarction.  She underwent coronary angiography this evening by Dr. Quay Burow and was found to have severe two-vessel coronary artery disease with the culprit vessel RCA which is occluded and collateralized by the LAD which has an ulcerated complex mid LAD stenosis and mid to distal LAD high-grade stenosis.  The patient was recommended CABG.  I was called by the family to do a second opinion as family was reluctant for CABG and also I had taken care off one of the daughters in the past and and no other family members.  I met the patient in the cardiac catheterization recovery area.  She was complaining of right upper shoulder pain that started as she was rolling into the catheter lab and states that this is persistent and thinks that there is something wrong.  Otherwise no other specific complaints, states that the chest burning sensation that she initially presented with has now subsided.  She does not give any history of GI bleed in the past, no surgeries planned in the recent future.  She denies symptoms of TIA or claudication.  She has history of meningioma that is chronic and stable by MRI in 2012.  Past Medical History  Diagnosis  Date  . Diabetes mellitus   . Hypertension   . Fibrocystic breast disease   . Meningioma   . Tendon tear, ankle      Past Surgical History  Procedure Laterality Date  . Breast lumpectomy    . Tonsillectomy    . Tendon tear    . Colonoscopy      05/25/2000. Results: Normal. Hemorrhoids     Family History  Problem Relation Age of Onset  . Cancer Mother   . Heart disease Father   . Stroke Father      Social History: History   Social History  . Marital Status: Widowed    Spouse Name: N/A    Number of Children: N/A  . Years of Education: N/A   Occupational History  . Not on file.   Social History Main Topics  . Smoking status: Never Smoker   . Smokeless tobacco: Not on file  . Alcohol Use: No  . Drug Use: No  . Sexual Activity: No   Other Topics Concern  . Not on file   Social History Narrative  . No narrative on file     Prescriptions prior to admission  Medication Sig Dispense Refill  . aspirin 81 MG tablet Take 81 mg by mouth daily.        Marland Kitchen atenolol (TENORMIN) 100 MG tablet Take 100 mg by mouth every evening.      . bismuth subsalicylate (PEPTO BISMOL) 262 MG/15ML suspension Take 30 mLs by mouth every 6 (six) hours as needed for indigestion.      Marland Kitchen  calcium carbonate (TUMS - DOSED IN MG ELEMENTAL CALCIUM) 500 MG chewable tablet Chew 1 tablet by mouth daily as needed for indigestion or heartburn.      . Nutritional Supplements (HIGH-PROTEIN NUTRITIONAL SHAKE) LIQD Take 60 mLs by mouth daily as needed (for supplement).      . olmesartan-hydrochlorothiazide (BENICAR HCT) 40-12.5 MG per tablet Take 1 tablet by mouth daily.  30 tablet  6    Scheduled Meds: . aspirin  81 mg Oral Daily  . [START ON 04/18/2013] atorvastatin  80 mg Oral QPC breakfast  . Latitude-TIMI study- Placebo / Losmapimod  (PI- Stuckey )  7.5 mg Oral BID  . metoprolol tartrate  25 mg Oral BID   Continuous Infusions: . sodium chloride 75 mL/hr at 04/17/13 1900  . [START ON 04/18/2013]  sodium chloride    . heparin    . nitroGLYCERIN    . tirofiban     PRN Meds:.morphine injection, nitroGLYCERIN  ROS: General: no fevers/chills/night sweats Eyes: no blurry vision, diplopia, or amaurosis ENT: no sore throat or hearing loss Resp: no cough, wheezing, or hemoptysis CV: no edema or palpitations GI: no abdominal pain, nausea, vomiting, diarrhea, or constipation GU: no dysuria, frequency, or hematuria Skin: no rash Neuro: no headache, numbness, tingling, or weakness of extremities Heme: no bleeding, DVT, or easy bruising Endo: no polydipsia or polyuria. Other systems negative    Physical Exam: Blood pressure 109/41, pulse 57, temperature 98.7 F (37.1 C), temperature source Oral, resp. rate 17, height _0  (1.651 m), weight 84 kg (185 lb 3 oz), SpO2 97.00%.   General appearance: alert, cooperative, appears stated age, no distress and moderately obese Lungs: clear to auscultation bilaterally Chest wall: no tenderness Heart: regular rate and rhythm, S1, S2 normal, no murmur, click, rub or gallop Abdomen: soft, non-tender; bowel sounds normal; no masses,  no organomegaly Extremities: extremities normal, atraumatic, no cyanosis or edema Pulses: 2+ and symmetric  Labs:   Lab Results  Component Value Date   WBC 12.6* 04/17/2013   HGB 12.7 04/17/2013   HCT 36.9 04/17/2013   MCV 83.3 04/17/2013   PLT 188 04/17/2013    Recent Labs Lab 04/16/13 2330 04/17/13 1000  NA 134* 137  K 3.6* 3.7  CL 94* 97  CO2 27 29  BUN 16 15  CREATININE 0.72 0.76  CALCIUM 9.2 9.2  PROT 6.2  --   BILITOT 0.8  --   ALKPHOS 72  --   ALT 27  --   AST 78*  --   GLUCOSE 169* 133*   Lab Results  Component Value Date   CKTOTAL 38 09/13/2010   CKMB 1.2 09/13/2010   TROPONINI 10.59* 04/17/2013    Lipid Panel     Component Value Date/Time   CHOL 298* 04/05/2013 1106   TRIG 274.0* 04/05/2013 1106   HDL 42.50 04/05/2013 1106   CHOLHDL 7 04/05/2013 1106   VLDL 54.8* 04/05/2013 1106   LDLCALC  156* 09/13/2010 0214   Cardiac Panel (last 3 results)  Recent Labs  04/16/13 2157 04/17/13 0322 04/17/13 1000  TROPONINI 11.24* 6.60* 10.59*    EKG:04/16/2013: Sinus bradycardia at the rate of 52 bpm, left axis deviation, cannot exclude inferior infarct, subacute, minimal nonspecific coving of the ST segment inferiorly, deep T wave inversion in the inferior leads suggest non-ST elevation myocardial infarction, T inversion in anterolateral leads suggest anterolateral ischemia.   Radiology: Dg Chest 2 View  04/16/2013   CLINICAL DATA:  Chest pain and hypertension.  EXAM: CHEST  2 VIEW  COMPARISON:  None.  FINDINGS: Heart size is mildly enlarged. Lungs clear. No pneumothorax pleural effusion. No focal bony abnormality.  IMPRESSION: No acute disease.   Electronically Signed   By: Inge Rise M.D.   On: 04/16/2013 20:50   Echocardiogram 04/17/2013: Normal left ventricle systolic function, ejection fraction 65 to 70%, no significant valvular abnormalities, IVC was dilated with blunted respiratory variation consistent with elevated central venous pressure   ASSESSMENT AND PLAN:  1.  Non-ST elevation myocardial infarction, angiogram performed this evening consistent with inferior wall non-ST elevation myocardial infarction with occluded right coronary artery. 2.Severe native vessel coronary artery disease involving mild to moderate diffuse disease in the LAD and RCA, LAD revealing complex ulcerated mid LAD stenosis followed by mid to distal high-grade 90% stenosis.  Right coronary artery is occluded and collateralized by the left system, ejection fraction 45% with inferior akinesis. 3.  Diabetes mellitus, diet controlled. HbA1c 5.9% this admission 4.  Mixed hyperlipidemia 5.  Hypertension  Recommendation: I had a very lengthy discussion with the patient and also all the family members including 3 daughters and sons.  I discussed the coronary anatomy including the fact that both PCI and CABG are  option.  I also discussed with them regarding the risks, benefits and alternatives to CABG and PCI, discussed the risks of PCI that includes but not limited to less than 1% risk of death, stroke, MI, need for urgent CABG but not limited to these and also renal failure given her age.  I also discussed with them that she has at least equal if not more risk of the same With CABG along with need for recovery time.  Patient has completed inferior infarct, but the inferior wall is probably viable as she has developed collaterals.  At this point I have discussed with them proceeding with angioplasty would be acceptable, with eye towards revascularization of the LAD first followed by RCA at the same setting if possible as LAD gives collaterals to the right coronary artery.  She has had an MI at least 2 days ago with troponin positive at 10 pm yesterday on presentation, hence CABG would essentially salvage LAD which is potentially easily amenable for percutaneous revascularization.  I also advised them that they can certainly make up their own mind and can have their own discussions and opinions and let me know whether they would likely to take over the care and proceed with angiography in the morning versus continuing the present care.  They were all in agreement that they would like me to try PCI and wanted to proceed with the same.  I will start the patient on Integrilin tonight as the upper back pain that she is complaining about could also be anginal equivalent as it just started this evening, also the troponin has risen compared to earlier this morning.  I'll continue to monitor the serum troponins, and schedule her for PCI in the morning.   Laverda Page, MD 04/17/2013, 9:15 PM [facility] [place] Pager: [phone] Office: [phone] If no answer Cell [phone]

## 2013-04-18 NOTE — Progress Notes (Signed)
Pt observed to be attempting to get oob without assistance while on bed rest.  Pt re-educated about need to remain still while on BR post procedure.  Pt visibly anxious and unable to comprehend this information, stating "I feel like Im going crazy." Pts family gathered from waiting room to sit with pt and provide calming influence.  Will continue to monitor closely. Marcellous Snarski, [facility]

## 2013-04-18 NOTE — Anesthesia Postprocedure Evaluation (Signed)
  Anesthesia Post-op Note  Patient: [name]  Procedure(s) Performed: Procedure(s): CARDIOVERSION-BEDSIDE (N/A)  Patient Location: PACU and Nursing Unit  Anesthesia Type:General  Level of Consciousness: awake, alert , oriented and patient cooperative  Airway and Oxygen Therapy: Patient Spontanous Breathing and Patient connected to nasal cannula oxygen  Post-op Pain: none  Post-op Assessment: Post-op Vital signs reviewed, Patient's Cardiovascular Status Stable, Respiratory Function Stable, Patent Airway and No signs of Nausea or vomiting  Post-op Vital Signs: Reviewed and stable  Complications: No apparent anesthesia complications

## 2013-04-18 NOTE — Interval H&P Note (Signed)
History and Physical Interval Note:  04/18/2013 12:14 PM  [name]  has presented today for surgery, with the diagnosis of A-FIB  The various methods of treatment have been discussed with the patient and family. After consideration of risks, benefits and other options for treatment, the patient has consented to  Procedure(s): CARDIOVERSION-BEDSIDE (N/A) as a surgical intervention .  The patient's history has been reviewed, patient examined, no change in status, stable for surgery.  I have reviewed the patient's chart and labs.  Questions were answered to the patient's satisfaction.     Laverda Page

## 2013-04-18 NOTE — CV Procedure (Signed)
Procedure performed:  Selective  left coronary arteriography. PTCA and stenting of the mid LAD with implantation of a 3.0 x 15 mm Xience Alpine, overlap in the proximal to mid segment with a 3.0 x 38 mm Xience Alpine eluting stent.  Indication: Patient is a 80 year-old female with history of hypertension,  hyperlipidemia, Borderline Diabetes Mellitus   who presents with non-ST elevation myocardial infarction. She had undergone diagnostic angiography on 04/17/2013 which had revealed an ulcerated complex mid LAD and mid to distal LAD high-grade stenosis and collaterals to the right coronary artery which was the culprit vessel for non-ST elevation myocardial infarction. The right coronary artery was occluded. Patient initially referred for CABG, but after review of the angiograms, I felt that percutaneous coronary was positioned is feasible and was planned on elective angioplasty the following morning. However patient developed atrial ablation with rapid ventricular response with ST elevation in the inferior leads and also significant his depression anterior and lateral leads. As LAD gives collaterals to the occluded right coronary artery, felt that proceeding with LAD angioplasty which had very high-grade 99% ulcerated stenosis was more apt to proceed. Hence was brought urgently to the angiography suite for coronary angiogram. Left ventricle gram done yesterday had revealed ejection fraction of 45% with inferior akinesis.  Hemodynamic data: Aortic pressure was 102/54 with a mean of 73 mm mercury.   Left main coronary artery is large and normal. Mild coronary calcification is evident.  Circumflex coronary artery: A small and nondominant vessel, mild disease.  LAD:  LAD gives origin to a large diagonal-1. The LAD gives origin to a very large first septal perforator and immediately after this there is diffuse luminal irregularity followed by an ulcerated very complex high-grade 99% stenosis. Diagonal one  arises just distal to the stenosis and is free of disease. After the origin of diagonal 2 is a high-grade 90% stenosis with a short segment..   Interventional data: Successful PTCA and stenting of the mid LAD with implantation of a 3.0 x 15 mm Xience Alpine, overlap in the proximal to mid segment with a 3.0 x 38 mm Xience Alpine eluting stent.  Will need antiplatelet therapy with BRILINTA and probably Coumadin as patient has the new onset of atrial fibrillation this morning. However if she converts to sinus rhythm, we can certainly start her on BRILINTA and ASA 81 mg for at least one year or more.   Technique of intervention:  Using a 6 [place] XB 3.5 LAD guide catheter the main  coronary  was selected and cannulated. Using Angiomax for anticoagulation, I utilized a Surveyor, minerals and try to cross the LAD stenosis, due to inability to cross the lesion, I used a soft Choice PT guidewire across the mid LAD stenosis with moderate amount of difficulty. I placed the tip of the wire into the distal  coronary artery. Angiography was performed.   Then I utilized a 2.0 x 20 mm splinter Legend balloon , I performed balloon angioplasty at 4 atmospheric pressure for 45 seconds in the mid to distal LAD stenosis and 14 atmospheric pressure for 60 seconds in the mid LAD.   I proceeded with implantation of a 3.0 x 15 mm Xience Alpine stent in the mid to distal LAD, deployed at 10 atmospheric pressure for 55 seconds. Then I stented the proximal segment overlapping with the previously placed and with implantation of a 3.0 x 38 mm Xience Alpine drug-eluting stent. The stent was deployed at 10 atmospheric pressure for 53  seconds. The stent was then post dilated with the same stent balloon pushing it gently distal and keeping it between the previously placed stent and a 14 atmospheric balloon inflation was performed for 20 seconds, intracoronary to him I couldn't symmetrical was administered and angiography was performed.  Excellent result was evident with minimal side branch jailing of the 1 with TIMI-3 flow throughout the vessel. There was no evidence of edge dissection. The guidewire was withdrawn out of the body and the guide catheter was engaged and pulled out of the body over the J-wire the was no immediate complication. Patient tolerated the procedure well. A total of 155 cc of contrast was utilized for interventional procedure.  Plan is to bring the patient back for possible RCA angioplasty which is occluded, but appears to be amenable for revascularization. Patient is presently on Aggrastat, I will continue this for 4 hours as she just received BRILINTA at the end of the case. Patient was uncomfortable on the table, arch and saturations on initial presentation around 87 - 89%, but improved to 93-94% during the procedure. I suspect she probably wasn't a great deal due to atrial fibrillation with rapid response that occurred early this morning.

## 2013-04-18 NOTE — Anesthesia Preprocedure Evaluation (Addendum)
Anesthesia Evaluation  Patient identified by MRN, date of birth, ID band Patient awake    Reviewed: Allergy & Precautions, H&P , NPO status , Patient's Chart, lab work & pertinent test results, reviewed documented beta blocker date and time   Airway Mallampati: II TM Distance: >3 FB Neck ROM: Full    Dental  (+) Partial Upper, Teeth Intact   Pulmonary  breath sounds clear to auscultation        Cardiovascular hypertension, Pt. on home beta blockers and Pt. on medications Rhythm:Regular Rate:Normal     Neuro/Psych    GI/Hepatic   Endo/Other  diabetes, Well Controlled  Renal/GU      Musculoskeletal   Abdominal   Peds  Hematology   Anesthesia Other Findings   Reproductive/Obstetrics                          Anesthesia Physical Anesthesia Plan  ASA: III  Anesthesia Plan: General   Post-op Pain Management:    Induction: Intravenous  Airway Management Planned: Mask  Additional Equipment:   Intra-op Plan:   Post-operative Plan:   Informed Consent:   Plan Discussed with: CRNA and Anesthesiologist  Anesthesia Plan Comments:        Anesthesia Quick Evaluation

## 2013-04-18 NOTE — Transfer of Care (Signed)
Immediate Anesthesia Transfer of Care Note  Patient: [name]  Procedure(s) Performed: Procedure(s): CARDIOVERSION-BEDSIDE (N/A)  Patient Location: PACU and Nursing Unit  Anesthesia Type:General  Level of Consciousness: awake, alert , oriented and patient cooperative  Airway & Oxygen Therapy: Patient Spontanous Breathing and Patient connected to nasal cannula oxygen  Post-op Assessment: Report given to PACU RN, Post -op Vital signs reviewed and stable and Patient moving all extremities  Post vital signs: Reviewed and stable  Complications: No apparent anesthesia complications

## 2013-04-18 NOTE — Progress Notes (Signed)
04/18/2013 0720  To cath lab. Lalah Durango, Carolynn Comment

## 2013-04-18 NOTE — Progress Notes (Signed)
ANTICOAGULATION CONSULT NOTE - Follow Up Consult  Pharmacy Consult for Heparin Indication: ACS/restart 8 hours post sheath removal  Allergies  Allergen Reactions  . Sulfonamide Derivatives Other (See Comments)    REACTION: childhood unknown type, ended up in hospital    Patient Measurements: Height: 5\' 5"  (165.1 cm) Weight: 185 lb 3 oz (84 kg) IBW/kg (Calculated) : 57 Heparin Dosing Weight: 73 kg  Vital Signs: Temp: 97.6 F (36.4 C) (02/17 1100) Temp src: Oral (02/17 1100) BP: 129/54 mmHg (02/17 1400) Pulse Rate: 80 (02/17 1400)  Labs:  Recent Labs  04/16/13 2157 04/16/13 2330 04/17/13 0322 04/17/13 0548 04/17/13 1000 04/18/13 0250  HGB  --  12.3 12.7  --   --  10.5*  HCT  --  36.1 36.9  --   --  30.6*  PLT  --  220 188  --   --  165  APTT  --  85*  --   --   --   --   LABPROT 12.9 13.5  --   --   --   --   INR 0.99 1.05  --   --   --   --   HEPARINUNFRC  --   --   --  0.30  --  <0.10*  CREATININE  --  0.72  --   --  0.76 0.70  TROPONINI 11.24*  --  6.60*  --  10.59*  --     Estimated Creatinine Clearance: 61 ml/min (by C-G formula based on Cr of 0.7).  Assessment: 8 YOF known to [place] prior to cath, now s/p PCI, for IV heparin now to resume heparin 8 hours post-sheath removal. Plan for another PCI tomorrow. Sheath was pulled 1535 per RN Rose. Prior to cath, heparin level was therapeutic at 850 units/hr.   Patient has been recently on tirofiban, which was stopped 2 hours prior to sheath removal.  Goal of Therapy:  Heparin level 0.3-0.7 units/ml Monitor platelets by anticoagulation protocol: Yes   Plan:  1. Restart heparin at 850 units/hr at 2200.  2. F/U cath schedule tomorrow. If patient is scheduled for PM cath, will check AM level. 3. F/U anticoagulation plans after PCI tomorrow.   Esmay Amspacher C. Cameren Earnest, PharmD Clinical Pharmacist-Resident Pager: [phone] Pharmacy: [phone] 04/18/2013 4:13 PM

## 2013-04-18 NOTE — Progress Notes (Signed)
RFA sheath removed without complications by Katra/canderson. Pressure to site x 20 min. Site level zero. Dressing applied. Pt teaching done. Right PT pulse palpable. Bedrest begins at 1535.

## 2013-04-18 NOTE — Interval H&P Note (Signed)
History and Physical Interval Note:  04/18/2013 7:57 AM  [name]  has presented today for surgery, with the diagnosis of cp  The various methods of treatment have been discussed with the patient and family. After consideration of risks, benefits and other options for treatment, the patient has consented to  Procedure(s): PERCUTANEOUS CORONARY STENT INTERVENTION (PCI-S) (N/A) as a surgical intervention .  The patient's history has been reviewed, patient examined, no change in status, stable for surgery.  I have reviewed the patient's chart and labs.  Questions were answered to the patient's satisfaction.   Cath Lab Visit (complete for each Cath Lab visit)  Clinical Evaluation Leading to the Procedure:   ACS: yes  Non-ACS:    Anginal Classification: CCS IV  Anti-ischemic medical therapy: Minimal Therapy (1 class of medications)  Non-Invasive Test Results: No non-invasive testing performed  Prior CABG: No previous CABG        [facility]

## 2013-04-19 ENCOUNTER — Encounter (HOSPITAL_COMMUNITY)
Admission: EM | Disposition: A | Discharge status: Home / Self Care | Payer: Medicare HMO | Source: Home / Self Care | Attending: Cardiology

## 2013-04-19 HISTORY — PX: PERCUTANEOUS CORONARY STENT INTERVENTION (PCI-S): SHX5485

## 2013-04-19 LAB — BASIC METABOLIC PANEL
BUN: 16 mg/dL (ref 6–23)
CHLORIDE: 101 meq/L (ref 96–112)
CO2: 23 meq/L (ref 19–32)
Calcium: 8.9 mg/dL (ref 8.4–10.5)
Creatinine, Ser: 0.59 mg/dL (ref 0.50–1.10)
GFR calc Af Amer: >90 mL/min (ref >90)
GFR calc non Af Amer: 85 mL/min — ABNORMAL LOW (ref >90)
GLUCOSE: 149 mg/dL — AB (ref 70–99)
POTASSIUM: 4.1 meq/L (ref 3.7–5.3)
SODIUM: 136 meq/L — AB (ref 137–147)

## 2013-04-19 LAB — CBC
HCT: 33.7 % — ABNORMAL LOW (ref 36.0–46.0)
Hemoglobin: 11.3 g/dL — ABNORMAL LOW (ref 12.0–15.0)
MCH: 28.3 pg (ref 26.0–34.0)
MCHC: 33.5 g/dL (ref 30.0–36.0)
MCV: 84.5 fL (ref 78.0–100.0)
PLATELETS: 176 10*3/uL (ref 150–400)
RBC: 3.99 MIL/uL (ref 3.87–5.11)
RDW: 13.3 % (ref 11.5–15.5)
WBC: 12.8 10*3/uL — AB (ref 4.0–10.5)

## 2013-04-19 LAB — HEPARIN LEVEL (UNFRACTIONATED): Heparin Unfractionated: <0.1 IU/mL — ABNORMAL LOW (ref 0.30–0.70)

## 2013-04-19 SURGERY — PERCUTANEOUS CORONARY STENT INTERVENTION (PCI-S)
Anesthesia: LOCAL

## 2013-04-19 MED ORDER — SODIUM CHLORIDE 0.9 % IJ SOLN: 3.0000 mL | INTRAMUSCULAR | Status: DC | PRN

## 2013-04-19 MED ORDER — HEPARIN (PORCINE) IN NACL 2-0.9 UNIT/ML-% IJ SOLN
INTRAMUSCULAR | Status: AC
  Filled 2013-04-19: qty 1500

## 2013-04-19 MED ORDER — ASPIRIN 81 MG PO CHEW: 81.0000 mg | CHEWABLE_TABLET | Freq: Every day | ORAL | Status: DC

## 2013-04-19 MED ORDER — LIDOCAINE HCL (PF) 1 % IJ SOLN
INTRAMUSCULAR | Status: AC
  Filled 2013-04-19: qty 30

## 2013-04-19 MED ORDER — ASPIRIN 81 MG PO CHEW
81.0000 mg | CHEWABLE_TABLET | Freq: Every day | ORAL | Status: DC
  Administered 2013-04-19 – 2013-04-20 (×2): 81 mg via ORAL
  Filled 2013-04-19 (×2): qty 1

## 2013-04-19 MED ORDER — SODIUM CHLORIDE 0.9 % IV SOLN: INTRAVENOUS | Status: DC

## 2013-04-19 MED ORDER — SODIUM CHLORIDE 0.9 % IV SOLN
1.0000 mL/kg/h | INTRAVENOUS | Status: AC
  Administered 2013-04-19: 1 mL/kg/h via INTRAVENOUS

## 2013-04-19 MED ORDER — HEPARIN (PORCINE) IN NACL 2-0.9 UNIT/ML-% IJ SOLN
INTRAMUSCULAR | Status: AC
  Filled 2013-04-19: qty 500

## 2013-04-19 MED ORDER — FUROSEMIDE 10 MG/ML IJ SOLN
40.0000 mg | Freq: Once | INTRAMUSCULAR | Status: AC
  Administered 2013-04-19: 40 mg via INTRAVENOUS
  Filled 2013-04-19: qty 4

## 2013-04-19 MED ORDER — SODIUM CHLORIDE 0.9 % IJ SOLN
3.0000 mL | Freq: Two times a day (BID) | INTRAMUSCULAR | Status: DC
  Administered 2013-04-19: 3 mL via INTRAVENOUS

## 2013-04-19 MED ORDER — FENTANYL CITRATE 0.05 MG/ML IJ SOLN
INTRAMUSCULAR | Status: AC
  Filled 2013-04-19: qty 2

## 2013-04-19 MED ORDER — BIVALIRUDIN 250 MG IV SOLR
INTRAVENOUS | Status: AC
  Filled 2013-04-19: qty 250

## 2013-04-19 MED ORDER — ATORVASTATIN CALCIUM 80 MG PO TABS
80.0000 mg | ORAL_TABLET | Freq: Every day | ORAL | Status: DC
  Administered 2013-04-19 – 2013-04-24 (×6): 80 mg via ORAL
  Filled 2013-04-19 (×7): qty 1

## 2013-04-19 MED ORDER — SODIUM CHLORIDE 0.9 % IV SOLN: 250.0000 mL | INTRAVENOUS | Status: DC | PRN

## 2013-04-19 MED ORDER — NITROGLYCERIN 0.2 MG/ML ON CALL CATH LAB
INTRAVENOUS | Status: AC
  Filled 2013-04-19: qty 1

## 2013-04-19 MED ORDER — SODIUM CHLORIDE 0.9 % IJ SOLN
3.0000 mL | Freq: Two times a day (BID) | INTRAMUSCULAR | Status: DC
  Administered 2013-04-19 – 2013-04-20 (×3): 3 mL via INTRAVENOUS
  Administered 2013-04-21: 11:00:00 via INTRAVENOUS
  Administered 2013-04-21 – 2013-04-25 (×8): 3 mL via INTRAVENOUS

## 2013-04-19 MED ORDER — TICAGRELOR 90 MG PO TABS
90.0000 mg | ORAL_TABLET | Freq: Two times a day (BID) | ORAL | Status: DC
  Administered 2013-04-19 – 2013-04-20 (×3): 90 mg via ORAL
  Filled 2013-04-19 (×5): qty 1

## 2013-04-19 MED ORDER — MIDAZOLAM HCL 2 MG/2ML IJ SOLN
INTRAMUSCULAR | Status: AC
  Filled 2013-04-19: qty 2

## 2013-04-19 MED ORDER — LIDOCAINE HCL (PF) 1 % IJ SOLN
INTRAMUSCULAR | Status: AC
  Filled 2013-04-19: qty 60

## 2013-04-19 MED ORDER — ENOXAPARIN SODIUM 40 MG/0.4ML ~~LOC~~ SOLN
40.0000 mg | SUBCUTANEOUS | Status: DC
  Administered 2013-04-20 – 2013-04-21 (×2): 40 mg via SUBCUTANEOUS
  Filled 2013-04-19 (×3): qty 0.4

## 2013-04-19 NOTE — Progress Notes (Signed)
RN reports pt has been anxious all day, just converted to NSR, and needs PCI today. Will hold ambulation for now. She is sitting in recliner talking to her family. Will f/u in am. Yves Dill CES, ACSM 1:10 PM 04/19/2013

## 2013-04-19 NOTE — Progress Notes (Signed)
ANTICOAGULATION CONSULT NOTE - Follow Up Consult  Pharmacy Consult for Heparin Indication: ACS/restart 8 hours post sheath removal  Allergies  Allergen Reactions  . Sulfonamide Derivatives Other (See Comments)    REACTION: childhood unknown type, ended up in hospital    Patient Measurements: Height: 5\' 5"  (165.1 cm) Weight: 185 lb 3 oz (84 kg) IBW/kg (Calculated) : 57 Heparin Dosing Weight: 73 kg  Vital Signs: Temp: 98 F (36.7 C) (02/18 1200) Temp src: Oral (02/18 1200) BP: 108/67 mmHg (02/18 1200) Pulse Rate: 62 (02/18 1200)  Labs:  Recent Labs  04/16/13 2157 04/16/13 2330 04/17/13 0322 04/17/13 0548 04/17/13 1000 04/18/13 0250 04/19/13 0257 04/19/13 1115  HGB  --  12.3 12.7  --   --  10.5* 11.3*  --   HCT  --  36.1 36.9  --   --  30.6* 33.7*  --   PLT  --  220 188  --   --  165 176  --   APTT  --  85*  --   --   --   --   --   --   LABPROT 12.9 13.5  --   --   --   --   --   --   INR 0.99 1.05  --   --   --   --   --   --   HEPARINUNFRC  --   --   --  0.30  --  <0.10*  --  <0.10*  CREATININE  --  0.72  --   --  0.76 0.70 0.59  --   TROPONINI 11.24*  --  6.60*  --  10.59*  --   --   --     Estimated Creatinine Clearance: 61 ml/min (by C-G formula based on Cr of 0.59).  Assessment: 22 YOF known to [place] prior to cath, now s/p PCI, for IV heparin now to resume heparin 8 hours post-sheath removal. Plan for another PCI this afternoon. Heparin level this am was low, no issues with infusion or bleeding noted per nursing.   Goal of Therapy:  Heparin level 0.3-0.7 units/ml Monitor platelets by anticoagulation protocol: Yes   Plan:  1. Increase heparin 1000 units/hr.  2. F/U after cath   Erin Hearing PharmD., BCPS Clinical Pharmacist Pager [phone] 04/19/2013 1:00 PM

## 2013-04-19 NOTE — Progress Notes (Signed)
04/19/2013 1540 to cath lab.  [name], Carolynn Comment

## 2013-04-19 NOTE — Progress Notes (Signed)
Subjective:  Patient states that she is feeling better, shortness of breath is improved, no further chest pain.  She converted to sinus rhythm at 4 AM this morning.  No other specific complaints, daughter is present at the bedside.  Objective:  Vital Signs in the last 24 hours: Temp:  [97.9 F (36.6 C)-98.5 F (36.9 C)] 98 F (36.7 C) (02/18 1200) Pulse Rate:  [27-114] 62 (02/18 1200) Resp:  [16-30] 16 (02/18 1200) BP: (82-141)/(37-112) 108/67 mmHg (02/18 1200) SpO2:  [96 %-100 %] 100 % (02/18 1200)  Intake/Output from previous day: 02/17 0701 - 02/18 0700 In: 1172.8 [I.V.:1172.8] Out: 200 [Urine:200]  Physical Exam:  General appearance: alert, cooperative, appears stated age, no distress and moderately obese, tachypnoeic.  Lungs: clear to auscultation bilaterally  Chest wall: no tenderness  Heart: regular rate and rhythm, S1, S2 normal, no murmur, click, rub or gallop  Abdomen: Obese, soft, non-tender; bowel sounds normal; no masses, no organomegaly  Extremities: extremities normal, atraumatic, no cyanosis or edema  Pulses: 2+ and symmetric, right groin without hematoma.    Lab Results:  Recent Labs  04/18/13 0250 04/19/13 0257  WBC 8.5 12.8*  HGB 10.5* 11.3*  PLT 165 176    Recent Labs  04/18/13 0250 04/19/13 0257  NA 138 136*  K 3.7 4.1  CL 100 101  CO2 26 23  GLUCOSE 106* 149*  BUN 15 16  CREATININE 0.70 0.59    Recent Labs  04/17/13 0322 04/17/13 1000  TROPONINI 6.60* 10.59*   Cardiac Panel (last 3 results)  Recent Labs  04/16/13 2157 04/17/13 0322 04/17/13 1000  TROPONINI 11.24* 6.60* 10.59*    Hepatic Function Panel  Recent Labs  04/16/13 2330  PROT 6.2  ALBUMIN 3.3*  AST 78*  ALT 27  ALKPHOS 72  BILITOT 0.8   No results found for this basename: CHOL,  in the last 72 hours No results found for this basename: PROTIME,  in the last 72 hours Lipid Panel     Component Value Date/Time   CHOL 298* 04/05/2013 1106   TRIG 274.0*  04/05/2013 1106   HDL 42.50 04/05/2013 1106   CHOLHDL 7 04/05/2013 1106   VLDL 54.8* 04/05/2013 1106   LDLCALC 156* 09/13/2010 0214   Scheduled Meds: . aspirin  81 mg Oral Daily  . atorvastatin  80 mg Oral q1800  . diltiazem  120 mg Oral Daily  . Latitude-TIMI study- Placebo / Losmapimod  (PI- Stuckey )  7.5 mg Oral BID  . metoprolol tartrate  50 mg Oral BID  . sodium chloride  3 mL Intravenous Q12H  . Ticagrelor  90 mg Oral BID   Continuous Infusions: . [START ON 04/20/2013] sodium chloride    . heparin 850 Units/hr (04/18/13 2235)  . nitroGLYCERIN     PRN Meds:.sodium chloride, ALPRAZolam, morphine injection, nitroGLYCERIN, sodium chloride   Cardiac Studies: Echocardiogram 04/17/2013: Normal left ventricle systolic function, ejection fraction 65 to 70%, no significant valvular abnormalities, IVC was dilated with blunted respiratory variation consistent with elevated central venous pressure  EKG 04/19/2013: Normal sinus rhythm at the rate of 63 bpm, normal axis.  Inferior nonspecific ST-T wave changes, cannot exclude ischemia.  Anterior ST segment depression with T-wave inversion consistent with ischemia.  Low voltage complexes.  Compared to EKG of 04/18/2013, atrial fibrillation is no longer evident.  Compared to yesterday's EKG at 9:17 AM, no significant change in ST-T wave abnormality.  Assessment/Plan:  1.  Non-ST elevation myocardial infarction, inferior wall.  Successful  PTCA and stenting of high-grade complex proximal to mid LAD by DES stenting on 04/18/2013 with implantation of overlapping 3.0 x 16 mm distal and 2.0 x 38 mm mid to proximal LAD with Xience Alpine DES. 2.  Mixed hyperlipidemia 3.  Atrial fibrillation, one episode without recurrence.  Failed cardioversion 2/70/2015, however has reverted to sinus rhythm this morning 4.  Diet-controlled diabetes mellitus 5.  Hypertension  Recommendation: In spite of having occluded right coronary artery, the LAD gave collaterals to the RCA.   During the episode of atrial fibrillation with rapid ventricular response, there was significant ST segment elevation suggestive of acute myocardial injury suggesting that the inferior wall is still viable and was compromised by demand ischemia.  Hence I feel that they may be benefit of opening up the occluded right coronary artery.  The circumflex coronary artery is small, hence I expect RCA to be very large and supplying significant amount of myocardium.  I have discussed in detail with the patient and her family regarding the risks, benefits and alternatives, including continued medical therapy.  They wish to proceed with angiography and I'll set this up this afternoon.  Otherwise she is on appropriate medical therapy, I'll continue the same.  I'll consider starting ACE inhibitor's once she is hemodynamically stable.   Laverda Page, M.D. 04/19/2013, 1:00 PM [facility] [place] Pager: [phone] Office: [phone] If no answer: [phone]

## 2013-04-19 NOTE — H&P (View-Only) (Signed)
Subjective:  Patient states that she is feeling better, shortness of breath is improved, no further chest pain.  She converted to sinus rhythm at 4 AM this morning.  No other specific complaints, daughter is present at the bedside.  Objective:  Vital Signs in the last 24 hours: Temp:  [97.9 F (36.6 C)-98.5 F (36.9 C)] 98 F (36.7 C) (02/18 1200) Pulse Rate:  [27-114] 62 (02/18 1200) Resp:  [16-30] 16 (02/18 1200) BP: (82-141)/(37-112) 108/67 mmHg (02/18 1200) SpO2:  [96 %-100 %] 100 % (02/18 1200)  Intake/Output from previous day: 02/17 0701 - 02/18 0700 In: 1172.8 [I.V.:1172.8] Out: 200 [Urine:200]  Physical Exam:  General appearance: alert, cooperative, appears stated age, no distress and moderately obese, tachypnoeic.  Lungs: clear to auscultation bilaterally  Chest wall: no tenderness  Heart: regular rate and rhythm, S1, S2 normal, no murmur, click, rub or gallop  Abdomen: Obese, soft, non-tender; bowel sounds normal; no masses, no organomegaly  Extremities: extremities normal, atraumatic, no cyanosis or edema  Pulses: 2+ and symmetric, right groin without hematoma.    Lab Results:  Recent Labs  04/18/13 0250 04/19/13 0257  WBC 8.5 12.8*  HGB 10.5* 11.3*  PLT 165 176    Recent Labs  04/18/13 0250 04/19/13 0257  NA 138 136*  K 3.7 4.1  CL 100 101  CO2 26 23  GLUCOSE 106* 149*  BUN 15 16  CREATININE 0.70 0.59    Recent Labs  04/17/13 0322 04/17/13 1000  TROPONINI 6.60* 10.59*   Cardiac Panel (last 3 results)  Recent Labs  04/16/13 2157 04/17/13 0322 04/17/13 1000  TROPONINI 11.24* 6.60* 10.59*    Hepatic Function Panel  Recent Labs  04/16/13 2330  PROT 6.2  ALBUMIN 3.3*  AST 78*  ALT 27  ALKPHOS 72  BILITOT 0.8   No results found for this basename: CHOL,  in the last 72 hours No results found for this basename: PROTIME,  in the last 72 hours Lipid Panel     Component Value Date/Time   CHOL 298* 04/05/2013 1106   TRIG 274.0*  04/05/2013 1106   HDL 42.50 04/05/2013 1106   CHOLHDL 7 04/05/2013 1106   VLDL 54.8* 04/05/2013 1106   LDLCALC 156* 09/13/2010 0214   Scheduled Meds: . aspirin  81 mg Oral Daily  . atorvastatin  80 mg Oral q1800  . diltiazem  120 mg Oral Daily  . Latitude-TIMI study- Placebo / Losmapimod  (PI- Stuckey )  7.5 mg Oral BID  . metoprolol tartrate  50 mg Oral BID  . sodium chloride  3 mL Intravenous Q12H  . Ticagrelor  90 mg Oral BID   Continuous Infusions: . [START ON 04/20/2013] sodium chloride    . heparin 850 Units/hr (04/18/13 2235)  . nitroGLYCERIN     PRN Meds:.sodium chloride, ALPRAZolam, morphine injection, nitroGLYCERIN, sodium chloride   Cardiac Studies: Echocardiogram 04/17/2013: Normal left ventricle systolic function, ejection fraction 65 to 70%, no significant valvular abnormalities, IVC was dilated with blunted respiratory variation consistent with elevated central venous pressure  EKG 04/19/2013: Normal sinus rhythm at the rate of 63 bpm, normal axis.  Inferior nonspecific ST-T wave changes, cannot exclude ischemia.  Anterior ST segment depression with T-wave inversion consistent with ischemia.  Low voltage complexes.  Compared to EKG of 04/18/2013, atrial fibrillation is no longer evident.  Compared to yesterday's EKG at 9:17 AM, no significant change in ST-T wave abnormality.  Assessment/Plan:  1.  Non-ST elevation myocardial infarction, inferior wall.  Successful   PTCA and stenting of high-grade complex proximal to mid LAD by DES stenting on 04/18/2013 with implantation of overlapping 3.0 x 16 mm distal and 2.0 x 38 mm mid to proximal LAD with Xience Alpine DES. 2.  Mixed hyperlipidemia 3.  Atrial fibrillation, one episode without recurrence.  Failed cardioversion 2/70/2015, however has reverted to sinus rhythm this morning 4.  Diet-controlled diabetes mellitus 5.  Hypertension  Recommendation: In spite of having occluded right coronary artery, the LAD gave collaterals to the RCA.   During the episode of atrial fibrillation with rapid ventricular response, there was significant ST segment elevation suggestive of acute myocardial injury suggesting that the inferior wall is still viable and was compromised by demand ischemia.  Hence I feel that they may be benefit of opening up the occluded right coronary artery.  The circumflex coronary artery is small, hence I expect RCA to be very large and supplying significant amount of myocardium.  I have discussed in detail with the patient and her family regarding the risks, benefits and alternatives, including continued medical therapy.  They wish to proceed with angiography and I'll set this up this afternoon.  Otherwise she is on appropriate medical therapy, I'll continue the same.  I'll consider starting ACE inhibitor's once she is hemodynamically stable.   Laverda Page, M.D. 04/19/2013, 1:00 PM [facility] [place] Pager: [phone] Office: [phone] If no answer: [phone]

## 2013-04-19 NOTE — Interval H&P Note (Signed)
History and Physical Interval Note:  04/19/2013 4:19 PM  [name]  has presented today for surgery, with the diagnosis of angina  The various methods of treatment have been discussed with the patient and family. After consideration of risks, benefits and other options for treatment, the patient has consented to  Procedure(s): PERCUTANEOUS CORONARY STENT INTERVENTION (PCI-S) (N/A) as a surgical intervention .  The patient's history has been reviewed, patient examined, no change in status, stable for surgery.  I have reviewed the patient's chart and labs.  Questions were answered to the patient's satisfaction.     Laverda Page

## 2013-04-19 NOTE — CV Procedure (Signed)
Procedure performed:  Selective right coronary arteriography. Thrombectomy of the right coronary artery for a thrombotic lesion in the distal RCA, PTCA and stenting of the distal, mid and proximal RCA with implantation of overlapping 3.0 x 38 mm distally and a 3.0 x 28 mm promus Premier drug-eluting stent.  Indication: Patient is elderly 80 year old Caucasian female who was recently admitted 3 days ago with non-ST elevation myocardial infarction, she had undergone diagnostic angiography on 04/17/2013 and underwent successful angioplasty to complex LAD stenosis on 04/18/2013. She had residual occlusion of right coronary artery, which was felt to be the culprit lesion, however the LAD angioplasty was performed because of a high-grade ulcerated stenosis and the LAD was giving collaterals to the right coronary artery which had been subacutely occluded per history. The circumflex coronary artery was very small and hence felt that the right carotid and a large, also patient had no Q waves in spite of non-ST elevation myocardial infarctions inferior leads in spite of complete occlusion of the right coronary artery a diagnostic angiography probably due to collaterals from the LAD. Hence felt it was appropriate to bring the patient back to attempt angioplasty to the right coronary artery.  Hemodynamic data: Aortic pressure was 91/52 with a mean of 69 mm mercury.   Right coronary artery: This is a very large vessel and a very dominant vessel giving origin to a very large PDA and large PL branch. It is occluded in the distal segment. It has got diffuse 70-80% stenosis in the proximal segment and midsegment. The PDA has a ostial 70% stenosis in the PL has made 70% stenosis.  Interventional data: Successful Thrombectomy of the right coronary artery for a thrombotic lesion in the distal RCA  Followed by PTCA and stenting of the distal, mid and proximal RCA with implantation of overlapping 3.0 x 38 mm distally and a 3.0 x  28 mm promus Premier drug-eluting stent. The stenosis was reduced from 100% to 0% with brisk TIMI 3 at the end of the procedure from TIMI 0 prior to the procedure.   Technique of diagnostic cardiac catheterization:  Under sterile precautions using a 6 French right radial  arterial access, a 6 French sheath was introduced into the right radial artery. A 6 [place] Ikari guide catheter was utilized to engage the right coronary artery. Using Angiomax for anticoagulation, I utilized a run through guidewire and across the distal right coronary artery without any difficulty. I placed the tip of the wire into the distal  coronary artery. Angiography was performed.  Then I utilized a 2.0 x 15 mm sprinter balloon , I performed balloon angioplasty at 6 atmospheric pressure x one for 50 seconds each. Repeat angiography revealed establishment of TIMI 3 flow into the right coronary artery, the vessel was extremely large. There was thrombus noted at the site of occlusion.   I decided to perform thrombectomy using priority one a.c. thrombectomy catheter. As I was performing thrombectomy, the aspiration stopped, I suspected that there is a large thrombus burden, hence I gently pulled the thrombectomy catheter out of the body keeping the dissection. Flushing of the catheter revealed a large atheroma and thrombus which was organized. Angiography revealed excellent result with no residual thrombus burden at the site of stenosis.  I then proceeded with implantation of 3.0 x 38 mm Promus Premier drug-eluting stent from the distal RCA into the mid RCA at 12 atmospheric pressure for 50 seconds, followed by stenting the proximal and midsegment overlapping 3.0 x 28 mm  Promus Premier drug-eluting stent which was deployed at 12 atmospheric pressure for 50 seconds. This was followed by post stent dilatation with a 3.0 x 20 mm [place] Tre[place] at 16 atmospheric pressure in the proximal segment, at 14 atmospheric pressure in the mid and distal  segments for 30 seconds each except distally for 50 seconds at the site of initial occlusion. Having performed this angiography revealed excellent results without any edge dissection. There was complete wall the position of the stent. The guidewire was withdrawn, angiography repeated and guide cath disengaged and pulled out of body where J-wire. Patient tolerated the procedure well. A total of 75 cc of contrast was utilized for diagnostic angiography and interventional procedure.

## 2013-04-19 NOTE — Progress Notes (Signed)
04/19/2013 0830 Very anxious after bath. Reassured and Xanax given. O2 on 4l 100%  Nigil Braman, Carolynn Comment

## 2013-04-20 ENCOUNTER — Inpatient Hospital Stay (HOSPITAL_COMMUNITY): Payer: Medicare HMO

## 2013-04-20 ENCOUNTER — Encounter: Payer: Self-pay | Admitting: Family Medicine

## 2013-04-20 LAB — BASIC METABOLIC PANEL
BUN: 20 mg/dL (ref 6–23)
BUN: 21 mg/dL (ref 6–23)
CALCIUM: 9.3 mg/dL (ref 8.4–10.5)
CO2: 25 mEq/L (ref 19–32)
CO2: 27 meq/L (ref 19–32)
CREATININE: 0.68 mg/dL (ref 0.50–1.10)
Calcium: 8.7 mg/dL (ref 8.4–10.5)
Chloride: 99 mEq/L (ref 96–112)
Chloride: 98 mEq/L (ref 96–112)
Creatinine, Ser: 0.6 mg/dL (ref 0.50–1.10)
GFR calc Af Amer: >90 mL/min (ref >90)
GFR, EST NON AFRICAN AMERICAN: 84 mL/min — AB (ref >90)
GFR, EST NON AFRICAN AMERICAN: 81 mL/min — AB (ref >90)
GLUCOSE: 108 mg/dL — AB (ref 70–99)
Glucose, Bld: 102 mg/dL — ABNORMAL HIGH (ref 70–99)
Potassium: 3.6 mEq/L — ABNORMAL LOW (ref 3.7–5.3)
Potassium: 3.2 mEq/L — ABNORMAL LOW (ref 3.7–5.3)
SODIUM: 134 meq/L — AB (ref 137–147)
Sodium: 136 mEq/L — ABNORMAL LOW (ref 137–147)

## 2013-04-20 LAB — CBC
HEMATOCRIT: 30.1 % — AB (ref 36.0–46.0)
HEMOGLOBIN: 10.1 g/dL — AB (ref 12.0–15.0)
MCH: 28.4 pg (ref 26.0–34.0)
MCHC: 33.6 g/dL (ref 30.0–36.0)
MCV: 84.6 fL (ref 78.0–100.0)
Platelets: 169 10*3/uL (ref 150–400)
RBC: 3.56 MIL/uL — ABNORMAL LOW (ref 3.87–5.11)
RDW: 13.3 % (ref 11.5–15.5)
WBC: 9.4 10*3/uL (ref 4.0–10.5)

## 2013-04-20 LAB — POCT ACTIVATED CLOTTING TIME: ACTIVATED CLOTTING TIME: 326 s

## 2013-04-20 MED ORDER — LOPERAMIDE HCL 2 MG PO CAPS
2.0000 mg | ORAL_CAPSULE | ORAL | Status: DC | PRN
  Administered 2013-04-20 – 2013-04-21 (×2): 2 mg via ORAL
  Filled 2013-04-20 (×2): qty 1

## 2013-04-20 MED ORDER — FUROSEMIDE 10 MG/ML IJ SOLN
INTRAMUSCULAR | Status: AC
  Administered 2013-04-20: 40 mg
  Filled 2013-04-20: qty 4

## 2013-04-20 MED ORDER — FUROSEMIDE 10 MG/ML IJ SOLN
40.0000 mg | Freq: Four times a day (QID) | INTRAMUSCULAR | Status: AC
  Administered 2013-04-20: 40 mg via INTRAVENOUS
  Filled 2013-04-20: qty 4

## 2013-04-20 MED ORDER — POTASSIUM CHLORIDE CRYS ER 20 MEQ PO TBCR
20.0000 meq | EXTENDED_RELEASE_TABLET | Freq: Two times a day (BID) | ORAL | Status: DC
  Administered 2013-04-20: 20 meq via ORAL
  Filled 2013-04-20 (×2): qty 1

## 2013-04-20 MED ORDER — LEVALBUTEROL HCL 0.63 MG/3ML IN NEBU: 0.6300 mg | INHALATION_SOLUTION | Freq: Four times a day (QID) | RESPIRATORY_TRACT | Status: DC | PRN

## 2013-04-20 MED FILL — Sodium Chloride IV Soln 0.9%: INTRAVENOUS | Qty: 50 | Status: AC

## 2013-04-20 NOTE — Progress Notes (Addendum)
Pt has had 3 episodes of loose/watery stool, after no BM since saturday

## 2013-04-20 NOTE — Progress Notes (Signed)
MD paged regarding pt frequent episodes of diarrhea, generalized weakness, and conversion back to Atrial Fib. MD made aware of pt condition. PRN medications ordered. Will continue to monitor.

## 2013-04-20 NOTE — Progress Notes (Signed)
CARDIAC REHAB PHASE I   PRE:  Rate/Rhythm: 55 SR  BP:  Supine: 120/66  Sitting:   Standing:    SaO2: 100 3L  MODE:  Ambulation: 150 ft   POST:  Rate/Rhythm:   BP:  Supine:   Sitting: 122/73  Standing:    SaO2: 98 3L 0850-0930 On arrival pt in bed sleeping, had some difficulty waking pt up,drowsy and kept falling back to sleep. Assisted X 2 used walker, gait belt and O2 3L to ambulate. Gait steady with walker, slow pace. Pt able to walk 150 feet, only c/o was of feeling weak. Pt to [facility] after walk then to recliner. Call light in reach and daughter present. Strongly encouraged pt to do more walking today and to eat.We will follow pt in am.  Rodney Langton RN 04/20/2013 9:31 AM

## 2013-04-20 NOTE — Progress Notes (Signed)
Subjective:  Patient states that she is feeling better, patient found to be hypoxemic even with minimal activity.  No chest pain.  Has maintained sinus rhythm. Objective:  Vital Signs in the last 24 hours: Temp:  [97.8 F (36.6 C)-98.4 F (36.9 C)] 98.3 F (36.8 C) (02/19 0800) Pulse Rate:  [56-74] 70 (02/19 1113) Resp:  [16-31] 19 (02/19 1113) BP: (93-150)/(38-111) 122/73 mmHg (02/19 1113) SpO2:  [96 %-100 %] 100 % (02/19 1113)  Intake/Output from previous day: 02/18 0701 - 02/19 0700 In: 1217 [P.O.:480; I.V.:737] Out: 950 [Urine:950]  Physical Exam:  General appearance: alert, cooperative, appears stated age, no distress and moderately obese, tachypnoeic.  Lungs: clear to auscultation bilaterally  Chest wall: no tenderness  Heart: regular rate and rhythm, S1, S2 normal, no murmur, click, rub or gallop  Abdomen: Obese, soft, non-tender; bowel sounds normal; no masses, no organomegaly  Extremities: extremities normal, atraumatic, no cyanosis or edema  Pulses: 2+ and symmetric, right groin without hematoma.    Lab Results:  Recent Labs  04/19/13 0257 04/20/13 0255  WBC 12.8* 9.4  HGB 11.3* 10.1*  PLT 176 169    Recent Labs  04/19/13 0257 04/20/13 0255  NA 136* 134*  K 4.1 3.6*  CL 101 99  CO2 23 25  GLUCOSE 149* 102*  BUN 16 20  CREATININE 0.59 0.60   Lipid Panel     Component Value Date/Time   CHOL 298* 04/05/2013 1106   TRIG 274.0* 04/05/2013 1106   HDL 42.50 04/05/2013 1106   CHOLHDL 7 04/05/2013 1106   VLDL 54.8* 04/05/2013 1106   LDLCALC 156* 09/13/2010 0214   Scheduled Meds: . aspirin  81 mg Oral Daily  . atorvastatin  80 mg Oral q1800  . diltiazem  120 mg Oral Daily  . enoxaparin (LOVENOX) injection  40 mg Subcutaneous Q24H  . furosemide  40 mg Intravenous Q6H  . Latitude-TIMI study- Placebo / Losmapimod  (PI- Stuckey )  7.5 mg Oral BID  . metoprolol tartrate  50 mg Oral BID  . sodium chloride  3 mL Intravenous Q12H  . Ticagrelor  90 mg Oral BID    Continuous Infusions:   PRN Meds:.sodium chloride, ALPRAZolam, levalbuterol, morphine injection, nitroGLYCERIN   Cardiac Studies: Echocardiogram 04/17/2013: Normal left ventricle systolic function, ejection fraction 65 to 70%, no significant valvular abnormalities, IVC was dilated with blunted respiratory variation consistent with elevated central venous pressure  EKG 04/19/2013: Normal sinus rhythm at the rate of 63 bpm, normal axis.  Inferior nonspecific ST-T wave changes, cannot exclude ischemia.  Anterior ST segment depression with T-wave inversion consistent with ischemia.  Low voltage complexes.  Compared to EKG of 04/18/2013, atrial fibrillation is no longer evident.  Compared to yesterday's EKG at 9:17 AM, no significant change in ST-T wave abnormality.  Chest x-ray 04/20/2013:  Findings compatible with pulmonary edema, small bilateral effusions and associated bibasilar opacities, atelectasis versus infiltrate. A follow-up chest radiograph in 4 to 6 weeks after treatment is recommended to ensure resolution.   Assessment/Plan:  1.  Non-ST elevation myocardial infarction, inferior wall.  Successful PTCA and stenting of high-grade complex proximal to mid LAD by DES stenting on 04/18/2013 with implantation of overlapping 3.0 x 16 mm distal and 3.0 x 38 mm mid to proximal LAD with Xience Alpine DES. Successful PTCA and stenting of a very large dominant RCA on 2/8102015 with implantation of overlapping 3.0 x 38 mid to distal and 3.0 x 28 mm mid to proximal Promus Premier drug-eluting stent  following thrombectomy. 2.  Pulmonary edema due to fluid overload state.  Patient has had significant myocardial ischemia on presentation. 3.  Mixed hyperlipidemia 4.  Atrial fibrillation (04/18/2013), one episode without recurrence.  Failed cardioversion 04/18/2013, however has reverted to sinus rhythm 04/19/2013. 4.  Diet-controlled diabetes mellitus 5.  Hypertension  Recommendation: I have added furosemide  for her present medical regimen today, I'll transfer her to step down unit.  She will need dialysis as she is about 3 L positive since admission to the hospital.  I expect her to completely recover and do well.  She will need cardiac rehabilitation referral and possible evaluation by physical therapy.  Laverda Page, M.D. 04/20/2013, 1:19 PM [facility] Pager: [phone] Office: [phone] If no answer: [phone]

## 2013-04-21 ENCOUNTER — Encounter (HOSPITAL_COMMUNITY): Payer: Self-pay | Admitting: Cardiology

## 2013-04-21 LAB — BASIC METABOLIC PANEL
BUN: 20 mg/dL (ref 6–23)
CALCIUM: 8.9 mg/dL (ref 8.4–10.5)
CO2: 26 meq/L (ref 19–32)
CREATININE: 0.61 mg/dL (ref 0.50–1.10)
Chloride: 99 mEq/L (ref 96–112)
GFR calc Af Amer: >90 mL/min (ref >90)
GFR, EST NON AFRICAN AMERICAN: 84 mL/min — AB (ref >90)
GLUCOSE: 105 mg/dL — AB (ref 70–99)
Potassium: 2.9 mEq/L — CL (ref 3.7–5.3)
SODIUM: 139 meq/L (ref 137–147)

## 2013-04-21 MED ORDER — MAGNESIUM SULFATE 40 MG/ML IJ SOLN
2.0000 g | Freq: Once | INTRAMUSCULAR | Status: AC
  Administered 2013-04-21: 2 g via INTRAVENOUS
  Filled 2013-04-21: qty 50

## 2013-04-21 MED ORDER — PANTOPRAZOLE SODIUM 40 MG PO TBEC
40.0000 mg | DELAYED_RELEASE_TABLET | Freq: Every day | ORAL | Status: DC
  Administered 2013-04-21 – 2013-04-24 (×4): 40 mg via ORAL
  Filled 2013-04-21 (×4): qty 1

## 2013-04-21 MED ORDER — POTASSIUM CHLORIDE 10 MEQ/100ML IV SOLN
10.0000 meq | INTRAVENOUS | Status: AC
  Administered 2013-04-21: 10 meq via INTRAVENOUS
  Filled 2013-04-21 (×2): qty 100

## 2013-04-21 MED ORDER — POTASSIUM CHLORIDE CRYS ER 20 MEQ PO TBCR
20.0000 meq | EXTENDED_RELEASE_TABLET | ORAL | Status: AC
  Administered 2013-04-21 (×3): 20 meq via ORAL
  Filled 2013-04-21 (×3): qty 1

## 2013-04-21 MED ORDER — APIXABAN 5 MG PO TABS
5.0000 mg | ORAL_TABLET | Freq: Two times a day (BID) | ORAL | Status: DC
  Administered 2013-04-21 – 2013-04-25 (×10): 5 mg via ORAL
  Filled 2013-04-21 (×10): qty 1

## 2013-04-21 MED ORDER — POTASSIUM CHLORIDE CRYS ER 20 MEQ PO TBCR
20.0000 meq | EXTENDED_RELEASE_TABLET | ORAL | Status: AC
  Administered 2013-04-21 (×3): 20 meq via ORAL
  Filled 2013-04-21 (×2): qty 1

## 2013-04-21 MED ORDER — CLOPIDOGREL BISULFATE 300 MG PO TABS
300.0000 mg | ORAL_TABLET | Freq: Once | ORAL | Status: AC
  Administered 2013-04-21: 300 mg via ORAL
  Filled 2013-04-21: qty 1

## 2013-04-21 MED ORDER — APIXABAN 5 MG PO TABS
5.0000 mg | ORAL_TABLET | Freq: Two times a day (BID) | ORAL | Status: DC
  Filled 2013-04-21 (×2): qty 1

## 2013-04-21 MED ORDER — CLOPIDOGREL BISULFATE 75 MG PO TABS
75.0000 mg | ORAL_TABLET | Freq: Every day | ORAL | Status: DC
  Administered 2013-04-22 – 2013-04-25 (×4): 75 mg via ORAL
  Filled 2013-04-21 (×5): qty 1

## 2013-04-21 MED ORDER — AMIODARONE HCL 200 MG PO TABS
400.0000 mg | ORAL_TABLET | Freq: Three times a day (TID) | ORAL | Status: DC
  Administered 2013-04-21 – 2013-04-23 (×7): 400 mg via ORAL
  Filled 2013-04-21 (×9): qty 2

## 2013-04-21 MED ORDER — METOPROLOL TARTRATE 25 MG PO TABS
25.0000 mg | ORAL_TABLET | Freq: Three times a day (TID) | ORAL | Status: DC
  Administered 2013-04-21 – 2013-04-25 (×13): 25 mg via ORAL
  Filled 2013-04-21 (×15): qty 1

## 2013-04-21 NOTE — Progress Notes (Signed)
CRITICAL VALUE ALERT  Critical value received:  Potassium   Date of notification:  04/21/2012  Time of notification:  0415  Critical value read back:yes  Nurse who received alert:  Ronnie Derby  MD notified (1st page):  Einar Gip  Time of first page:  0417  MD notified (2nd page):  Time of second page:  Responding MD:  Einar Gip, MD  Time MD responded:  [phone]

## 2013-04-21 NOTE — Progress Notes (Signed)
1200 Came to walk pt about 1045. Talked with pt's RN. She states pt having low BP and atrial fib and not up to walking today. We will hold ambulation and continue to follow. Graylon Good RN BSN 04/21/2013 12:17 PM

## 2013-04-21 NOTE — Progress Notes (Signed)
ANTICOAGULATION CONSULT NOTE - Initial Consult  Pharmacy Consult for apixaban Indication: atrial fibrillation  Allergies  Allergen Reactions  . Sulfonamide Derivatives Other (See Comments)    REACTION: childhood unknown type, ended up in hospital    Patient Measurements: Height: 5\' 5"  (165.1 cm) Weight: 185 lb 3 oz (84 kg) IBW/kg (Calculated) : 57 Heparin Dosing Weight:   Vital Signs: Temp: 97.7 F (36.5 C) (02/20 0400) Temp src: Axillary (02/20 0400) BP: 92/59 mmHg (02/20 0500) Pulse Rate: 61 (02/20 0700)  Labs:  Recent Labs  04/19/13 0257 04/19/13 1115 04/20/13 0255 04/20/13 1628 04/21/13 0247  HGB 11.3*  --  10.1*  --   --   HCT 33.7*  --  30.1*  --   --   PLT 176  --  169  --   --   HEPARINUNFRC  --  <0.10*  --   --   --   CREATININE 0.59  --  0.60 0.68 0.61    Estimated Creatinine Clearance: 61 ml/min (by C-G formula based on Cr of 0.61).   Medical History: Past Medical History  Diagnosis Date  . Diabetes mellitus   . Hypertension   . Fibrocystic breast disease   . Meningioma   . Tendon tear, ankle     Assessment: 80 year old female s/p nstemi with successful PTCA. Patient converted back into afib overnight, orders to start amio load and apixaban for anticoagulation. Antiplatelet agent changed to plavix to reduce bleeding risk. Patient did receive sq lovenox (40mg ) dose this morning, will delay apixaban starting time for a few hours. Will need to watch for s/s of bleeding closely in these first few days. Will check cbc in am.  Goal of Therapy:  Monitor platelets by anticoagulation protocol: Yes   Plan:  Apixaban 5mg  bid starting today 04/21/2013  Check cbc in am then as indicated from clinical progression Will provide education prior to discharge  Erin Hearing PharmD., BCPS Clinical Pharmacist Pager [phone] 04/21/2013 9:34 AM

## 2013-04-21 NOTE — Discharge Instructions (Addendum)
Information on my medicine - ELIQUIS (apixaban)  This medication education was reviewed with me or my healthcare representative as part of my discharge preparation.  The pharmacist that spoke with me during my hospital stay was:  Lorenda Peck, [facility]  Why was Eliquis prescribed for you? Eliquis was prescribed for you to reduce the risk of forming blood clots that can cause a stroke if you have a medical condition called atrial fibrillation (a type of irregular heartbeat) OR to reduce the risk of a blood clots forming after orthopedic surgery.  What do You need to know about Eliquis ? Take your Eliquis TWICE DAILY - one tablet in the morning and one tablet in the evening with or without food.  It would be best to take the doses about the same time each day.  If you have difficulty swallowing the tablet whole please discuss with your pharmacist how to take the medication safely.  Take Eliquis exactly as prescribed by your doctor and DO NOT stop taking Eliquis without talking to the doctor who prescribed the medication.  Stopping may increase your risk of developing a new clot or stroke.  Refill your prescription before you run out.  After discharge, you should have regular check-up appointments with your healthcare provider that is prescribing your Eliquis.  In the future your dose may need to be changed if your kidney function or weight changes by a significant amount or as you get older.  What do you do if you miss a dose? If you miss a dose, take it as soon as you remember on the same day and resume taking twice daily.  Do not take more than one dose of ELIQUIS at the same time.  Important Safety Information A possible side effect of Eliquis is bleeding. You should call your healthcare provider right away if you experience any of the following:   Bleeding from an injury or your nose that does not stop.   Unusual colored urine (red or dark brown) or unusual colored stools (red or  black).   Unusual bruising for unknown reasons.   A serious fall or if you hit your head (even if there is no bleeding).  Some medicines may interact with Eliquis and might increase your risk of bleeding or clotting while on Eliquis. To help avoid this, consult your healthcare provider or pharmacist prior to using any new prescription or non-prescription medications, including herbals, vitamins, non-steroidal anti-inflammatory drugs (NSAIDs) and supplements.  This website has more information on Eliquis (apixaban): [email or web address].   Angina Pectoris Angina pectoris, often just called angina, is extreme discomfort in your chest, neck, or arm caused by a lack of blood in the middle and thickest layer of your heart wall (myocardium). It may feel like tightness or heavy pressure. It may feel like a crushing or squeezing pain. Some people say it feels like gas or indigestion. It may go down your shoulders, back, and arms. Some people may have symptoms other than pain. These symptoms include fatigue, shortness of breath, cold sweats, or nausea. There are four different types of angina:  Stable angina Stable angina usually occurs in episodes of predictable frequency and duration. It usually is brought on by physical activity, emotional stress, or excitement. These are all times when the myocardium needs more oxygen. Stable angina usually lasts a few minutes and often is relieved by taking a medicine that can be taken under your tongue (sublingually). The medicine is called nitroglycerin. Stable angina is caused by a  buildup of plaque inside the arteries, which restricts blood flow to the heart muscle (atherosclerosis).  Unstable angina Unstable angina can occur even when your body experiences little or no physical exertion. It can occur during sleep. It can also occur at rest. It can suddenly increase in severity or frequency. It might not be relieved by sublingual nitroglycerin. It can last up to 30  minutes. The most common cause of unstable angina is a blood clot that has developed on the top of plaque buildup inside a coronary artery. It can lead to a heart attack if the blood clot completely blocks the artery.  Microvascular angina This type of angina is caused by a disorder of tiny blood vessels called arterioles. Microvascular angina is more common in women. The pain may be more severe and last longer than other types of angina pectoris.  Prinzmetal or variant angina This type of angina pectoris usually occurs when your body experiences little or no physical exertion. It especially occurs in the early morning hours. It is caused by a spasm of your coronary artery. HOME CARE INSTRUCTIONS   Only take over-the-counter and prescription medicines as directed by your caregiver.  Stay active or increase your exercise as directed by your caregiver.  Limit strenuous activity as directed by your caregiver.  Limit heavy lifting as directed by your caregiver.  Maintain a healthy weight.  Learn about and eat heart-healthy foods.  Do not smoke. SEEK IMMEDIATE MEDICAL CARE IF:  You experience the following symptoms:  Chest, neck, deep shoulder, or arm pain or discomfort that lasts more than a few minutes.  Chest, neck, deep shoulder, or arm pain or discomfort that goes away and comes back, repeatedly.  Heavy sweating with discomfort, without a noticeable cause.  Shortness of breath or difficulty breathing.  Angina that does not get better after a few minutes of rest or after taking sublingual nitroglycerin. These can all be symptoms of a heart attack, which is a medical emergency! Get medical help at once. Call your local emergency service (911 in [place] immediately. Do not  drive yourself to the hospital and do not  wait to for your symptoms to go away. MAKE SURE YOU:  Understand these instructions.  Will watch your condition.  Will get help right away if you are not doing well or  get worse. Document Released: 02/16/2005 Document Revised: 02/03/2012 Document Reviewed: 11/26/2011 [facility] Patient Information 2014 [place], [place].

## 2013-04-21 NOTE — Progress Notes (Addendum)
Subjective:  Patient states that she is feeling much better today and breathing better, has not had diarrhea for almost 24 hours, no further episodes since late last night.  Patient states that she feels like she is getting back to her baseline.  Objective:  Vital Signs in the last 24 hours: Temp:  [97.3 F (36.3 C)-98 F (36.7 C)] 98 F (36.7 C) (02/20 1200) Pulse Rate:  [25-125] 73 (02/20 1200) Resp:  [16-27] 17 (02/20 1200) BP: (80-106)/(45-84) 106/56 mmHg (02/20 1200) SpO2:  [91 %-99 %] 99 % (02/20 1200)  Intake/Output from previous day: 02/19 0701 - 02/20 0700 In: 78 [P.O.:560] Out: 400 [Urine:400]  Physical Exam:  General appearance: alert, cooperative, appears stated age, no distress and moderately obese, tachypnoeic.  Lungs: clear to auscultation bilaterally.  Decreased breath sounds at the bases to absent breath sounds at the bases. Chest wall: no tenderness  Heart:  S1 variable, S2 normal, no murmur, click, rub or gallop  Abdomen: Obese, soft, non-tender; bowel sounds normal; no masses, no organomegaly  Extremities: extremities normal, atraumatic, no cyanosis or edema  Pulses: 2+ and symmetric, right groin without hematoma.    Lab Results:  Recent Labs  04/19/13 0257 04/20/13 0255  WBC 12.8* 9.4  HGB 11.3* 10.1*  PLT 176 169    Recent Labs  04/20/13 1628 04/21/13 0247  NA 136* 139  K 3.2* 2.9*  CL 98 99  CO2 27 26  GLUCOSE 108* 105*  BUN 21 20  CREATININE 0.68 0.61   Lipid Panel     Component Value Date/Time   CHOL 298* 04/05/2013 1106   TRIG 274.0* 04/05/2013 1106   HDL 42.50 04/05/2013 1106   CHOLHDL 7 04/05/2013 1106   VLDL 54.8* 04/05/2013 1106   LDLCALC 156* 09/13/2010 0214   Scheduled Meds: . amiodarone  400 mg Oral TID  . apixaban  5 mg Oral BID  . atorvastatin  80 mg Oral q1800  . [START ON 04/22/2013] clopidogrel  75 mg Oral Q breakfast  . Latitude-TIMI study- Placebo / Losmapimod  (PI- Stuckey )  7.5 mg Oral BID  . metoprolol tartrate  25  mg Oral TID  . pantoprazole  40 mg Oral Daily  . sodium chloride  3 mL Intravenous Q12H   Continuous Infusions:   PRN Meds:.sodium chloride, ALPRAZolam, levalbuterol, loperamide, morphine injection, nitroGLYCERIN   Cardiac Studies: Echocardiogram 04/17/2013: Normal left ventricle systolic function, ejection fraction 65 to 70%, no significant valvular abnormalities, IVC was dilated with blunted respiratory variation consistent with elevated central venous pressure  EKG 04/19/2013: Normal sinus rhythm at the rate of 63 bpm, normal axis.  Inferior nonspecific ST-T wave changes, cannot exclude ischemia.  Anterior ST segment depression with T-wave inversion consistent with ischemia.  Low voltage complexes.  Compared to EKG of 04/18/2013, atrial fibrillation is no longer evident.  Compared to yesterday's EKG at 9:17 AM, no significant change in ST-T wave abnormality.  Chest x-ray 04/20/2013:  Findings compatible with pulmonary edema, small bilateral effusions and associated bibasilar opacities, atelectasis versus infiltrate. A follow-up chest radiograph in 4 to 6 weeks after treatment is recommended to ensure resolution.   Assessment/Plan:  1.  Non-ST elevation myocardial infarction, inferior wall.  Successful PTCA and stenting of high-grade complex proximal to mid LAD by DES stenting on 04/18/2013 with implantation of overlapping 3.0 x 16 mm distal and 3.0 x 38 mm mid to proximal LAD with Xience Alpine DES. Successful PTCA and stenting of a very large dominant RCA on 2/8102015  with implantation of overlapping 3.0 x 38 mid to distal and 3.0 x 28 mm mid to proximal Promus Premier drug-eluting stent following thrombectomy. 2.  Pulmonary edema due to fluid overload state.  Patient has had significant myocardial ischemia on presentation. 3.  Mixed hyperlipidemia 4.  Atrial fibrillation (04/18/2013),  Recurrence last night and BP borderline to low.  Failed cardioversion 04/18/2013, however has reverted to sinus  rhythm 04/19/2013. 4.  Diet-controlled diabetes mellitus 5.  Hypertension  Recommendation: patient is presently doing well, states that her energy level is improved, she is not having any further shortness of breath.  Diarrhea has stopped.  She has severe hypokalemia and potassium is being repleted.  Due to recurrence of atrial fibrillation, I would like to go ahead and start her on long-term anticoagulation for now until she proves that this is only temporary due to acute myocardial ischemia, I will start the patient on Eliquis 5 mg by mouth twice a day and discontinue Brilinta and switch her over to Plavix.  I'll also discontinue aspirin due to bleeding risk.  If she remains stable she'll be transferred to the stepdown unit in the next 24 hours.  I have also started her on amiodarone 400 mg by mouth 3 times a day due to atrial fibrillation with rapid ventricular response even with activity like getting up in the bed.  Her blood pressure is borderline, hypertension, hence I have discontinued diltiazem and reduce the dose of metoprolol from 50 mg 3 times a day to 25 mg by mouth 3 times a day.  Laverda Page, M.D. 04/21/2013, 2:23 PM [facility] Pager: [phone] Office: [phone] If no answer: [phone]

## 2013-04-22 LAB — CBC
HCT: 31.2 % — ABNORMAL LOW (ref 36.0–46.0)
Hemoglobin: 10.6 g/dL — ABNORMAL LOW (ref 12.0–15.0)
MCH: 28.3 pg (ref 26.0–34.0)
MCHC: 34 g/dL (ref 30.0–36.0)
MCV: 83.2 fL (ref 78.0–100.0)
Platelets: 228 10*3/uL (ref 150–400)
RBC: 3.75 MIL/uL — ABNORMAL LOW (ref 3.87–5.11)
RDW: 13 % (ref 11.5–15.5)
WBC: 6.3 10*3/uL (ref 4.0–10.5)

## 2013-04-22 LAB — BASIC METABOLIC PANEL
BUN: 18 mg/dL (ref 6–23)
CO2: 27 mEq/L (ref 19–32)
Calcium: 8.9 mg/dL (ref 8.4–10.5)
Chloride: 101 mEq/L (ref 96–112)
Creatinine, Ser: 0.74 mg/dL (ref 0.50–1.10)
GFR, EST NON AFRICAN AMERICAN: 79 mL/min — AB (ref >90)
Glucose, Bld: 111 mg/dL — ABNORMAL HIGH (ref 70–99)
POTASSIUM: 4.4 meq/L (ref 3.7–5.3)
Sodium: 136 mEq/L — ABNORMAL LOW (ref 137–147)

## 2013-04-22 MED ORDER — BISACODYL 10 MG RE SUPP: 10.0000 mg | Freq: Every day | RECTAL | Status: DC | PRN

## 2013-04-22 NOTE — Progress Notes (Signed)
CARDIAC REHAB PHASE I   PRE:  Rate/Rhythm:76 SR  BP:  Supine:  Sitting: 120/60  Standing:   SaO2: 95 ra  MODE:  Ambulation: 200 ft   POST:  Rate/Rhythm: 76 sr  BP:  Supine:   Sitting: 129/60  Standing:   SaO2: 96-97 ra Patient was sleepy, but balance steady with rolling walker and assistance x 2.  She walked further than expected.  No complaints of chest pain or angina.  Encouraged to walk with staff again today and two times tomorrow. [phone] Liliane Channel RN, BSN 04/22/2013 2:10 PM

## 2013-04-23 DIAGNOSIS — E782 Mixed hyperlipidemia: Secondary | ICD-10-CM | POA: Insufficient documentation

## 2013-04-23 LAB — GLUCOSE, CAPILLARY
GLUCOSE-CAPILLARY: 139 mg/dL — AB (ref 70–99)
Glucose-Capillary: 103 mg/dL — ABNORMAL HIGH (ref 70–99)
Glucose-Capillary: 91 mg/dL (ref 70–99)
Glucose-Capillary: 91 mg/dL (ref 70–99)
Glucose-Capillary: 97 mg/dL (ref 70–99)

## 2013-04-23 MED ORDER — AMIODARONE HCL 200 MG PO TABS
200.0000 mg | ORAL_TABLET | Freq: Three times a day (TID) | ORAL | Status: DC
  Administered 2013-04-23 – 2013-04-24 (×4): 200 mg via ORAL
  Filled 2013-04-23 (×5): qty 1

## 2013-04-23 MED ORDER — INSULIN ASPART 100 UNIT/ML ~~LOC~~ SOLN
0.0000 [IU] | Freq: Three times a day (TID) | SUBCUTANEOUS | Status: DC
Start: 2013-04-23 — End: 2013-04-25

## 2013-04-23 MED ORDER — FENTANYL CITRATE 0.05 MG/ML IJ SOLN: 25.0000 ug | Freq: Four times a day (QID) | INTRAMUSCULAR | Status: DC | PRN

## 2013-04-23 MED ORDER — TRAMADOL HCL 50 MG PO TABS
50.0000 mg | ORAL_TABLET | Freq: Three times a day (TID) | ORAL | Status: DC | PRN
  Administered 2013-04-23: 50 mg via ORAL
  Filled 2013-04-23: qty 1

## 2013-04-23 NOTE — Progress Notes (Signed)
Patient complaining of left heel pain 8/10.  Skin is intact.  RN paged MD, waiting on return phone call.  RN to continue monitoring patient

## 2013-04-23 NOTE — Progress Notes (Addendum)
Subjective:  Patient states that she is feeling much better today and breathing better, has maintained sinus rhythm. Nursing notice that she is not motivated to ambulate and has cracked heel in her feet and also c/o pain there.  Used bedside commode and was having difficulty in getting up and needed assistance. No chest pain or dyspnea. Daughter present at bedside.  Objective:  Vital Signs in the last 24 hours: Temp:  [97.7 F (36.5 C)-98.4 F (36.9 C)] 98.4 F (36.9 C) (02/22 0732) Pulse Rate:  [57-64] 57 (02/22 0800) Resp:  [18-21] 20 (02/22 0732) BP: (106-141)/(31-68) 127/57 mmHg (02/22 0800) SpO2:  [92 %-99 %] 96 % (02/22 0800)  Intake/Output from previous day: 02/21 0701 - 02/22 0700 In: 37 [P.O.:540] Out: 700 [Urine:700]  Physical Exam:  General appearance: alert, cooperative, appears stated age, no distress and moderately obese, tachypnoeic.  Lungs: clear to auscultation bilaterally.  Decreased breath sounds at the bases to absent breath sounds at the bases. Chest wall: no tenderness  Heart:  S1 variable, S2 normal, no murmur, click, rub or gallop  Abdomen: Obese, soft, non-tender; bowel sounds normal; no masses, no organomegaly  Extremities: extremities normal, atraumatic, no cyanosis or edema  Pulses: 2+ and symmetric, right groin without hematoma.    Lab Results:  Recent Labs  04/22/13 0125  WBC 6.3  HGB 10.6*  PLT 228    Recent Labs  04/21/13 0247 04/22/13 0125  NA 139 136*  K 2.9* 4.4  CL 99 101  CO2 26 27  GLUCOSE 105* 111*  BUN 20 18  CREATININE 0.61 0.74   Lipid Panel     Component Value Date/Time   CHOL 298* 04/05/2013 1106   TRIG 274.0* 04/05/2013 1106   HDL 42.50 04/05/2013 1106   CHOLHDL 7 04/05/2013 1106   VLDL 54.8* 04/05/2013 1106   LDLCALC 156* 09/13/2010 0214   Scheduled Meds: . amiodarone  400 mg Oral TID  . apixaban  5 mg Oral BID  . atorvastatin  80 mg Oral q1800  . clopidogrel  75 mg Oral Q breakfast  . Latitude-TIMI study- Placebo  / Losmapimod  (PI- Stuckey )  7.5 mg Oral BID  . metoprolol tartrate  25 mg Oral TID  . pantoprazole  40 mg Oral Daily  . sodium chloride  3 mL Intravenous Q12H   Continuous Infusions:   PRN Meds:.sodium chloride, ALPRAZolam, bisacodyl, levalbuterol, loperamide, morphine injection, nitroGLYCERIN   Cardiac Studies: Echocardiogram 04/17/2013: Normal left ventricle systolic function, ejection fraction 65 to 70%, no significant valvular abnormalities, IVC was dilated with blunted respiratory variation consistent with elevated central venous pressure  EKG 04/19/2013: Normal sinus rhythm at the rate of 63 bpm, normal axis.  Inferior nonspecific ST-T wave changes, cannot exclude ischemia.  Anterior ST segment depression with T-wave inversion consistent with ischemia.  Low voltage complexes.  Compared to EKG of 04/18/2013, atrial fibrillation is no longer evident.  Compared to yesterday's EKG at 9:17 AM, no significant change in ST-T wave abnormality.  Chest x-ray 04/20/2013:  Findings compatible with pulmonary edema, small bilateral effusions and associated bibasilar opacities, atelectasis versus infiltrate. A follow-up chest radiograph in 4 to 6 weeks after treatment is recommended to ensure resolution.   Assessment/Plan:  1.  Non-ST elevation myocardial infarction, inferior wall.  Successful PTCA and stenting of high-grade complex proximal to mid LAD by DES stenting on 04/18/2013 with implantation of overlapping 3.0 x 16 mm distal and 3.0 x 38 mm mid to proximal LAD with Xience Alpine DES.  Successful PTCA and stenting of a very large dominant RCA on 2/8102015 with implantation of overlapping 3.0 x 38 mid to distal and 3.0 x 28 mm mid to proximal Promus Premier drug-eluting stent following thrombectomy. 2.  Pulmonary edema due to fluid overload state.  Patient has had significant myocardial ischemia on presentation. 3.  Mixed hyperlipidemia 4.  Atrial fibrillation (04/18/2013),  Recurrence last night and  BP borderline to low.  Failed cardioversion 04/18/2013, however has reverted to sinus rhythm 04/19/2013. 4.  Diet-controlled diabetes mellitus 5.  Hypertension. 6. Physical disability and patient having difficulty with ambulation and also using bedside commode. She did walk with assistance from [facility] to [place] room transfer yesterday.  Recommendation: Patient is presently doing well, looks so much improved and breathing normally,  but recuperating slowly due to her underlying co-morbidities and may need discharge planning and outpatient cardiac rehab and also PT. The crack in the heel is very minimal. No infection,  Continue present medications. Fall risk discussed. Transfer to telemetry. Gave positive reinforcement regarding physical activity.  Patient has refused to take the study drug for the last 2 days and states she is willing to take this but not right now as she is feeling overwhelmed. Will discuss with study coordinators tomorrow.  Laverda Page, M.D. 04/23/2013, 10:45 AM [facility] Pager: [phone] Office: [phone] If no answer: [phone]

## 2013-04-23 NOTE — Progress Notes (Signed)
As patient was being transferred from the unit to 2W, patients daughter stated that patient is missing her gold watch. Daughter stated "It may be in moms pocketbook that has been sent home". RN checked drawers of previous room, [place] and current room [place]. Unable to locate watch in these two room. Daughter complained to secretary yesterday that someone had taken her cell phone charger from room, but charger located in patients bedside table drawer.

## 2013-04-23 NOTE — Progress Notes (Signed)
Patient has arrived on unit via wheel chair. Patient appears to be in no distress. Patient placed on telemetry monitor and CCMD made aware of patients admission.

## 2013-04-24 ENCOUNTER — Inpatient Hospital Stay (HOSPITAL_COMMUNITY): Payer: Medicare HMO

## 2013-04-24 LAB — GLUCOSE, CAPILLARY
GLUCOSE-CAPILLARY: 94 mg/dL (ref 70–99)
Glucose-Capillary: 96 mg/dL (ref 70–99)
Glucose-Capillary: 87 mg/dL (ref 70–99)
Glucose-Capillary: 103 mg/dL — ABNORMAL HIGH (ref 70–99)

## 2013-04-24 MED ORDER — HYDROCORTISONE 1 % EX CREA
TOPICAL_CREAM | CUTANEOUS | Status: DC | PRN
  Administered 2013-04-25: 1 via TOPICAL
  Filled 2013-04-24 (×2): qty 28

## 2013-04-24 MED ORDER — DIPHENHYDRAMINE HCL 12.5 MG/5ML PO ELIX
12.5000 mg | ORAL_SOLUTION | Freq: Four times a day (QID) | ORAL | Status: DC | PRN
  Administered 2013-04-24 – 2013-04-25 (×4): 12.5 mg via ORAL
  Filled 2013-04-24 (×5): qty 5

## 2013-04-24 MED ORDER — DIPHENHYDRAMINE HCL 25 MG PO CAPS: 12.5000 mg | ORAL_CAPSULE | Freq: Four times a day (QID) | ORAL | Status: DC | PRN

## 2013-04-24 NOTE — Progress Notes (Signed)
Subjective:  Doing well. Has had difficulty in ambulation and weight bearing left leg. No chest pain or dyspnea.  Objective:  Vital Signs in the last 24 hours: Temp:  [97.9 F (36.6 C)-98.4 F (36.9 C)] 98 F (36.7 C) (02/23 1538) Pulse Rate:  [59-64] 61 (02/23 1557) Resp:  [16-18] 18 (02/23 1538) BP: (109-135)/(50-82) 135/82 mmHg (02/23 1557) SpO2:  [96 %-97 %] 97 % (02/23 1538)  Intake/Output from previous day: 02/22 0701 - 02/23 0700 In: 446 [P.O.:440; I.V.:6] Out: 500 [Urine:500]  Physical Exam:  General appearance: alert, cooperative, appears stated age, no distress and moderately obese, tachypnoeic.  Lungs: clear to auscultation bilaterally.  Decreased breath sounds at the bases to absent breath sounds at the bases. Chest wall: no tenderness  Heart:  S1 variable, S2 normal, no murmur, click, rub or gallop  Abdomen: Obese, soft, non-tender; bowel sounds normal; no masses, no organomegaly  Extremities: extremities normal, atraumatic, no cyanosis or edema  Pulses: 2+ and symmetric, right groin without hematoma.  Legs: Bilateral plantar exam is normal except a superficial skin crack at the sole of the foot without any tenderness or edema. No pressure point tenderness demonstrated. No edema.    Lab Results:  Recent Labs  04/22/13 0125  WBC 6.3  HGB 10.6*  PLT 228    Recent Labs  04/22/13 0125  NA 136*  K 4.4  CL 101  CO2 27  GLUCOSE 111*  BUN 18  CREATININE 0.74   Lipid Panel     Component Value Date/Time   CHOL 298* 04/05/2013 1106   TRIG 274.0* 04/05/2013 1106   HDL 42.50 04/05/2013 1106   CHOLHDL 7 04/05/2013 1106   VLDL 54.8* 04/05/2013 1106   LDLCALC 156* 09/13/2010 0214   Scheduled Meds: . amiodarone  200 mg Oral TID  . apixaban  5 mg Oral BID  . atorvastatin  80 mg Oral q1800  . clopidogrel  75 mg Oral Q breakfast  . insulin aspart  0-15 Units Subcutaneous TID WC  . Latitude-TIMI study- Placebo / Losmapimod  (PI- Stuckey )  7.5 mg Oral BID  .  metoprolol tartrate  25 mg Oral TID  . pantoprazole  40 mg Oral Daily  . sodium chloride  3 mL Intravenous Q12H   Continuous Infusions:   PRN Meds:.sodium chloride, ALPRAZolam, bisacodyl, diphenhydrAMINE, fentaNYL, levalbuterol, morphine injection, nitroGLYCERIN, traMADol   Cardiac Studies: Echocardiogram 04/17/2013: Normal left ventricle systolic function, ejection fraction 65 to 70%, no significant valvular abnormalities, IVC was dilated with blunted respiratory variation consistent with elevated central venous pressure  EKG 04/19/2013: Normal sinus rhythm at the rate of 63 bpm, normal axis.  Inferior nonspecific ST-T wave changes, cannot exclude ischemia.  Anterior ST segment depression with T-wave inversion consistent with ischemia.  Low voltage complexes.  Compared to EKG of 04/18/2013, atrial fibrillation is no longer evident.  Compared to yesterday's EKG at 9:17 AM, no significant change in ST-T wave abnormality.  Chest x-ray 04/20/2013:  Findings compatible with pulmonary edema, small bilateral effusions and associated bibasilar opacities, atelectasis versus infiltrate. A follow-up chest radiograph in 4 to 6 weeks after treatment is recommended to ensure resolution.   Assessment/Plan:  1.  Non-ST elevation myocardial infarction, inferior wall.  Successful PTCA and stenting of high-grade complex proximal to mid LAD by DES stenting on 04/18/2013 with implantation of overlapping 3.0 x 16 mm distal and 3.0 x 38 mm mid to proximal LAD with Xience Alpine DES. Successful PTCA and stenting of a very large dominant  RCA on 2/8102015 with implantation of overlapping 3.0 x 38 mid to distal and 3.0 x 28 mm mid to proximal Promus Premier drug-eluting stent following thrombectomy. 2.  Pulmonary edema due to fluid overload state.  Patient has had significant myocardial ischemia on presentation. 3.  Mixed hyperlipidemia 4.  Atrial fibrillation (04/18/2013),  Recurrence last night and BP borderline to low.   Failed cardioversion 04/18/2013, however has reverted to sinus rhythm 04/19/2013. 4.  Diet-controlled diabetes mellitus 5.  Hypertension. 6. Physical disability and patient having difficulty with ambulation.  Recommendation: X-Ray of the foot reviewed with the patient and family. I am not completely sure of the etiology, physical exam is completely benign so is the X-ray. Have reassured the patient and family. Plan on discharge tomorrow if stable. I will do OP Event monitor and if no A. Fib, may consider to discontinue Eliquis. I will discontinue Amio for now. I suspect pulmonary edema and CHF and acute myocardial ischemia led to her A. Fib.  Patient has refused to take the study drug for the last 2 days and states she is willing to take this but not right now as she is feeling overwhelmed. Will discuss with study coordinators tomorrow.  Laverda Page, M.D. 04/24/2013, 8:56 PM [facility] Pager: [phone] Office: [phone] If no answer: [phone]

## 2013-04-24 NOTE — Progress Notes (Signed)
[id number] Came to walk with pt but family stated pt cannot bear weight and is to have ankle XRay. Did some MI ed with daughter and granddaughter and pt. Pt sleepy and fell asleep during ed but family states they will re enforce. Discussed importance of brilinta with stents, gave MI booklet and reviewed restrictions, NTG use and calling 911, diabetic and heart healthy diets. Did not give ex ed as we need more information from [facility]. Discussed CRP 2 for future goal and pt and family want pt to attend. Will refer to [place]. Will continue to follow. Graylon Good RN BSN 04/24/2013 10:36 AM

## 2013-04-24 NOTE — Progress Notes (Signed)
UR Completed.  [name] Jane [phone] 04/24/2013  

## 2013-04-24 NOTE — Progress Notes (Signed)
ANTICOAGULATION CONSULT NOTE - Initial Consult  Pharmacy Consult for apixaban Indication: atrial fibrillation  Allergies  Allergen Reactions  . Sulfonamide Derivatives Other (See Comments)    REACTION: childhood unknown type, ended up in hospital    Patient Measurements: Height: 5\' 5"  (165.1 cm) Weight: 185 lb 3 oz (84 kg) IBW/kg (Calculated) : 57   Vital Signs: Temp: 97.9 F (36.6 C) (02/23 0557) Temp src: Oral (02/23 0557) BP: 109/50 mmHg (02/23 0557) Pulse Rate: 59 (02/23 0557)  Labs:  Recent Labs  04/22/13 0125  HGB 10.6*  HCT 31.2*  PLT 228  CREATININE 0.74   Medical History: Past Medical History  Diagnosis Date  . Diabetes mellitus   . Hypertension   . Fibrocystic breast disease   . Meningioma   . Tendon tear, ankle     Assessment: 80 year old female s/p nstemi with successful PTCA. Patient converted to afib and was begun on amiodarone load and apixaban for anticoagulation. Antiplatelet agent was changed to plavix to reduce bleeding risk. Patient's renal function and cbc remain stable.  No signs of bleeding.  Goal of Therapy:  Monitor platelets by anticoagulation protocol: Yes   Plan:  Continue apixaban 5mg  bid  CBC q72h  Hughes Better, PharmD, BCPS Clinical Pharmacist Pager: [phone] 04/24/2013 8:21 AM

## 2013-04-24 NOTE — Progress Notes (Signed)
Physical Therapy Evaluation Patient Details Name: [name] MRN: [id number] DOB: 1931/09/01 Today's Date: 04/24/2013   History of Present Illness  Adm 2/15 with NSTEMI. Pt developed Lt heel pain 2/22. xray negative. Pt describes pain at plantar aspect of calcaneus (plantar fascia insertion) and posterior aspect (where Achilles tendon inserts). PMHx includes DM   Clinical Impression  Pt very limited in mobility due to Lt heel pain (see above). She currently is unsafe to ascend steps and has steps to enter each family member's home. ? If orthopedic consult and use of an anti-inflammatory or injection may be of benefit? Currently pt would require to be carried up the steps, and family unsure if they can manage this. Another option would be discharge via ambulance. Entrance into each home has at least 4 steps (per daughters), which a temporary/rental ramp cannot span. Pt will benefit from continued therapy to increase safety with mobility.    Follow Up Recommendations Home health PT;Supervision/Assistance - 24 hour    Equipment Recommendations  Rolling walker with 5" wheels;3in1 (PT)    Recommendations for Other Services Other (comment) (Orthopedic consult; ?injection)   Precautions / Restrictions Precautions Precautions: Fall Restrictions Weight Bearing Restrictions: No Other Position/Activity Restrictions: Pt poorly tolerates weightbearing on Lt foot due to heel pain      Home Living Family/patient expects to be discharged to:: Private residence Living Arrangements: Alone Available Help at Discharge: Family (to stay at daughter's home) Type of Home: House Home Access: Stairs to enter   Home Layout: One level Home Equipment: None Additional Comments: Discussed multiple family members' homes and all have stairs to enter. Daughters arrived at end of session and think a nephew may be able to assist/carry her up steps       Prior Function Level of Independence: Independent         Pertinent Vitals/Pain 10/10 Lt heel with weight bearing (even in her sneakers--although reports it is slightly less painful with shoes on)   Communication No difficulties   Mobility  Bed Mobility Overal bed mobility: Modified Independent             General bed mobility comments: with rail; HOB 0  Transfers Overall transfer level: Needs assistance Equipment used: Rolling walker (2 wheeled) Transfers: Sit to/from Stand Sit to Stand: Min assist         General transfer comment: required vc each transfer (x 5) for safe use of RW and safe alignment with surface prior to sitting (pt tends to "throw herself" towards the chair due to Lt foot pain. Nearly missed edge of chair x 1  Ambulation/Gait Ambulation/Gait assistance: Min assist Ambulation Distance (Feet): 10 Feet (10, 10, 2, 2, 3) Assistive device: Rolling walker (2 wheeled) Gait Pattern/deviations: Step-to pattern;Antalgic     General Gait Details: poor ability to [employer] Lt foot via use of UEs on RW due to weakness  Stairs: Yes           Balance                                    Hand Dominance    Extremity/Trunk Assessment Upper Extremity Assessment: Generalized weakness Upper Extremity Assessment Upper Extremity Assessment: Generalized weakness       Lower Extremity Assessment: Generalized weakness       Cervical / Trunk Assessment: Normal    Cognition Arousal/Alertness: Awake/alert Behavior During Therapy: WFL for tasks assessed/performed Overall Cognitive  Status: History of cognitive impairments - at baseline       Memory: Decreased short-term memory                General Comments    Exercises   Pt educated to don her sneakers every time she gets OOB (prior to standing!) to decrease stress on plantar fascia.    Assessment/Plan    PT Assessment Patient needs continued PT services  PT Diagnosis Difficulty walking;Acute pain   PT Problem List Decreased  strength;Decreased balance;Decreased mobility;Decreased knowledge of use of DME;Decreased safety awareness;Pain  PT Treatment Interventions DME instruction;Gait training;Stair training;Functional mobility training;Therapeutic activities;Patient/family education   PT Goals (Current goals can be found in the Care Plan section) Acute Rehab PT Goals Patient Stated Goal: decr heel pain PT Goal Formulation: With patient/family Time For Goal Achievement: 04/26/13 Potential to Achieve Goals: Fair   PT Stated Goals Patient Stated Goal: decr heel pain  PT Goal formulation PT Goal Formulation: With patient/family  Time for Goal achievement Time For Goal Achievement: 04/26/13  Potential to Achieve Goals Potential to Achieve Goals: Fair  Frequency Min 4X/week   Barriers to discharge Inaccessible home environment all possible d/c destinations have steps to enter per family    End of Session Equipment Utilized During Treatment: Gait belt Activity Tolerance: Patient limited by pain Patient left: in bed;with call bell/phone within reach;with family/visitor present         Time: 6967-8938 PT Time Calculation (min): 66 min Charges:          PT Evaluation $Initial PT Evaluation Tier I: 1 Procedure           PT Treatments $Gait Training: 23-37 mins $Therapeutic Activity: 8-22 mins  PT G Codes:                           Pachia Strum 04/24/2013, 3:39 PM Pager [phone]

## 2013-04-24 NOTE — Progress Notes (Signed)
Pt now refusing to take all meds until something is ordered for itching; RN spoke with Dr. Einar Gip; order for PO Benadryl X1 to be given now; will encourage pt to take meds and remind pt of importance of meds just as MD did first thing this AM; daughter at bedside; will cont. To monitor.

## 2013-04-24 NOTE — Progress Notes (Signed)
MD called regarding rash of pt's back and buttocks; pt reports rash is very irritating and itches at this time; no PRN for itching ordered; will await MD callback.

## 2013-04-24 NOTE — Progress Notes (Signed)
Pt given PO Benadryl for itching rash; pt also took AM meds after much encouragement from nursing staff, nursing student, and nursing instructor; family in room; will cont. To monitor.

## 2013-04-24 NOTE — Progress Notes (Signed)
PT Cancellation Note  Patient Details Name: [name] MRN: [id number] DOB: 04/17/1931   Cancelled Treatment:    Reason Eval/Treat Not Completed: Patient not medically ready--noted order for Lt foot xray. Will await results prior to PT evaluation.   Roderick Calo 04/24/2013, 10:05 AM Pager [phone]

## 2013-04-24 NOTE — Progress Notes (Signed)
Pt given 12.5mg  PO Benadryl at this time for itching; will cont. To monitor.

## 2013-04-25 DIAGNOSIS — M7660 Achilles tendinitis, unspecified leg: Secondary | ICD-10-CM

## 2013-04-25 DIAGNOSIS — I2510 Atherosclerotic heart disease of native coronary artery without angina pectoris: Secondary | ICD-10-CM

## 2013-04-25 LAB — CBC
HEMATOCRIT: 32.5 % — AB (ref 36.0–46.0)
HEMOGLOBIN: 11.2 g/dL — AB (ref 12.0–15.0)
MCH: 28.4 pg (ref 26.0–34.0)
MCHC: 34.5 g/dL (ref 30.0–36.0)
MCV: 82.3 fL (ref 78.0–100.0)
Platelets: 220 10*3/uL (ref 150–400)
RBC: 3.95 MIL/uL (ref 3.87–5.11)
RDW: 12.5 % (ref 11.5–15.5)
WBC: 5.7 10*3/uL (ref 4.0–10.5)

## 2013-04-25 LAB — GLUCOSE, CAPILLARY: Glucose-Capillary: 101 mg/dL — ABNORMAL HIGH (ref 70–99)

## 2013-04-25 MED ORDER — ALPRAZOLAM 0.5 MG PO TABS: 0.5000 mg | ORAL_TABLET | Freq: Three times a day (TID) | ORAL | Status: DC | PRN

## 2013-04-25 MED ORDER — NITROGLYCERIN 0.4 MG SL SUBL: 0.4000 mg | SUBLINGUAL_TABLET | SUBLINGUAL | Status: DC | PRN

## 2013-04-25 MED ORDER — ATENOLOL 100 MG PO TABS: 50.0000 mg | ORAL_TABLET | Freq: Every evening | ORAL | Status: DC

## 2013-04-25 MED ORDER — HYDROCORTISONE 1 % EX CREA: TOPICAL_CREAM | CUTANEOUS | Status: DC | PRN

## 2013-04-25 MED ORDER — STUDY - INVESTIGATIONAL DRUG SIMPLE RECORD: 7.5000 mg | Freq: Two times a day (BID) | Status: DC

## 2013-04-25 MED ORDER — CLOPIDOGREL BISULFATE 75 MG PO TABS: 75.0000 mg | ORAL_TABLET | Freq: Every day | ORAL | Status: DC

## 2013-04-25 MED ORDER — FAMOTIDINE 40 MG PO TABS
40.0000 mg | ORAL_TABLET | Freq: Every day | ORAL | Status: DC
  Filled 2013-04-25: qty 1

## 2013-04-25 MED ORDER — FAMOTIDINE 40 MG PO TABS: 40.0000 mg | ORAL_TABLET | Freq: Every day | ORAL | Status: DC

## 2013-04-25 MED ORDER — APIXABAN 5 MG PO TABS: 5.0000 mg | ORAL_TABLET | Freq: Two times a day (BID) | ORAL | Status: DC

## 2013-04-25 MED ORDER — ATORVASTATIN CALCIUM 80 MG PO TABS: 80.0000 mg | ORAL_TABLET | Freq: Every day | ORAL | Status: DC

## 2013-04-25 MED ORDER — DIPHENHYDRAMINE HCL 12.5 MG/5ML PO ELIX: 12.5000 mg | ORAL_SOLUTION | Freq: Four times a day (QID) | ORAL | Status: DC | PRN

## 2013-04-25 NOTE — Progress Notes (Signed)
Subjective:  Doing well. Has had difficulty in ambulation and weight bearing left leg. No chest pain or dyspnea. Patient's family concerned about risk of fall and request placement in rehabilitation. She developed generalized rash yesterday. No other specific complaints. Her leg pain, foot pain is better when she walks with shoes on. Patient's family also state that she has mild dementia, and they're worried about overall status and safety about going home.  Objective:  Vital Signs in the last 24 hours: Temp:  [98 F (36.7 C)-98.5 F (36.9 C)] 98.3 F (36.8 C) (02/24 0516) Pulse Rate:  [58-66] 58 (02/24 0516) Resp:  [18] 18 (02/24 0516) BP: (106-136)/(42-82) 117/48 mmHg (02/24 0516) SpO2:  [95 %-100 %] 95 % (02/24 0516)  Intake/Output from previous day: 02/23 0701 - 02/24 0700 In: 16 [P.O.:480] Out: -   Physical Exam:  General appearance: alert, cooperative, appears stated age, no distress and moderately obese, tachypnoeic.  Lungs: clear to auscultation bilaterally.  Decreased breath sounds at the bases to absent breath sounds at the bases. Chest wall: no tenderness  Heart:  S1 variable, S2 normal, no murmur, click, rub or gallop  Abdomen: Obese, soft, non-tender; bowel sounds normal; no masses, no organomegaly  Extremities: extremities normal, atraumatic, no cyanosis or edema  Pulses: 2+ and symmetric, right groin without hematoma.  Legs: Bilateral plantar exam is normal except a superficial skin crack at the sole of the foot without any tenderness or edema. No pressure point tenderness demonstrated. No edema.    Lab Results:  Recent Labs  04/25/13 0518  WBC 5.7  HGB 11.2*  PLT 220   No results found for this basename: NA, K, CL, CO2, GLUCOSE, BUN, CREATININE,  in the last 72 hours Lipid Panel     Component Value Date/Time   CHOL 298* 04/05/2013 1106   TRIG 274.0* 04/05/2013 1106   HDL 42.50 04/05/2013 1106   CHOLHDL 7 04/05/2013 1106   VLDL 54.8* 04/05/2013 1106   LDLCALC  156* 09/13/2010 0214   Scheduled Meds: . apixaban  5 mg Oral BID  . atorvastatin  80 mg Oral q1800  . clopidogrel  75 mg Oral Q breakfast  . Latitude-TIMI study- Placebo / Losmapimod  (PI- Stuckey )  7.5 mg Oral BID  . metoprolol tartrate  25 mg Oral TID  . pantoprazole  40 mg Oral Daily  . sodium chloride  3 mL Intravenous Q12H   Continuous Infusions:   PRN Meds:.sodium chloride, ALPRAZolam, bisacodyl, diphenhydrAMINE, fentaNYL, hydrocortisone cream, levalbuterol, morphine injection, nitroGLYCERIN   Cardiac Studies: Echocardiogram 04/17/2013: Normal left ventricle systolic function, ejection fraction 65 to 70%, no significant valvular abnormalities, IVC was dilated with blunted respiratory variation consistent with elevated central venous pressure  EKG 04/19/2013: Normal sinus rhythm at the rate of 63 bpm, normal axis.  Inferior nonspecific ST-T wave changes, cannot exclude ischemia.  Anterior ST segment depression with T-wave inversion consistent with ischemia.  Low voltage complexes.  Compared to EKG of 04/18/2013, atrial fibrillation is no longer evident.  Compared to yesterday's EKG at 9:17 AM, no significant change in ST-T wave abnormality.  Chest x-ray 04/20/2013:  Findings compatible with pulmonary edema, small bilateral effusions and associated bibasilar opacities, atelectasis versus infiltrate. A follow-up chest radiograph in 4 to 6 weeks after treatment is recommended to ensure resolution.   Assessment/Plan:  1.  Non-ST elevation myocardial infarction, inferior wall.  Successful PTCA and stenting of high-grade complex proximal to mid LAD by DES stenting on 04/18/2013 with implantation of overlapping 3.0  x 16 mm distal and 3.0 x 38 mm mid to proximal LAD with Xience Alpine DES. Successful PTCA and stenting of a very large dominant RCA on 2/8102015 with implantation of overlapping 3.0 x 38 mid to distal and 3.0 x 28 mm mid to proximal Promus Premier drug-eluting stent following  thrombectomy. 2.  Pulmonary edema due to fluid overload state.  Patient has had significant myocardial ischemia on presentation. 3.  Mixed hyperlipidemia 4.  Atrial fibrillation (04/18/2013),  Recurrence last night and BP borderline to low.  Failed cardioversion 04/18/2013, however has reverted to sinus rhythm 04/19/2013. 4.  Diet-controlled diabetes mellitus 5.  Hypertension. 6. Physical disability and patient having difficulty with ambulation. Risk of fall, patient on anticoagulants. Recommendation: Patient up rash after she was transferred to 2000, I started her on tramadol. I suspect this could be giving her rash. I will discontinue this. I will also discontinue PPI and switch her to Pepcid. I will have physical medicine to see her today to see whether she would benefit from inpatient rehabilitation for a short time. From cardiac standpoint he remained stable and there is no recurrence of atrial fibrillation. I may consider outpatient event monitoring and eventually discontinue and across.  Laverda Page, M.D. 04/25/2013, 10:29 AM [facility] Pager: [phone] Office: [phone] If no answer: [phone]

## 2013-04-25 NOTE — Progress Notes (Signed)
Physical Therapy Treatment Patient Details Name: THURSA EMME MRN: [id number] DOB: 1932-02-26 Today's Date: 04/25/2013 Time: 6967-8938 PT Time Calculation (min): 26 min  PT Assessment / Plan / Recommendation  History of Present Illness Adm 2/15 with NSTEMI. Pt developed Lt heel pain 2/22. xray negative. Pt describes pain at plantar aspect of calcaneus and posterior aspect (where Achilles tendon inserts). PMHx includes DM   PT Comments   Pt able to tolerate increased ambulation and weight on LLE today. Pt educated for gait, transfers, stairs and bed mobility all with dgtr present. Pt able to walk 15' then 25' after stair ambulation. Recommend continued short bouts of ambulation until pt feels confidant with LLE. Will follow.   Follow Up Recommendations  Home health PT;Supervision/Assistance - 24 hour     Does the patient have the potential to tolerate intense rehabilitation     Barriers to Discharge        Equipment Recommendations  Rolling walker with 5" wheels;3in1 (PT)    Recommendations for Other Services    Frequency Min 3X/week   Progress towards PT Goals Progress towards PT goals: Progressing toward goals  Plan Frequency needs to be updated;Current plan remains appropriate    Precautions / Restrictions Precautions Precautions: Fall   Pertinent Vitals/Pain 0/10 pain at rest, 5/10 LLE with gait HR 51-62 sats 90-93% on RA    Mobility  Transfers Equipment used: None Transfers: Sit to/from Stand Sit to Stand: Min guard General transfer comment: cues for hand placement, safety and backing fully to surface. x 2 trials Ambulation/Gait Ambulation/Gait assistance: Min assist Ambulation Distance (Feet): 25 Feet Assistive device: Rolling walker (2 wheeled) Gait Pattern/deviations: Step-through pattern;Decreased stride length;Trunk flexed Gait velocity interpretation: <1.8 ft/sec, indicative of risk for recurrent falls Stairs assistance: Min assist Stair Management:  Sideways;One rail Left;Step to pattern Number of Stairs: 4 General stair comments: cues for sequence, positioning on stairs and safety with support at pelvis for stability with dgtr present throughout     Exercises     PT Diagnosis:    PT Problem List:   PT Treatment Interventions:     PT Goals (current goals can now be found in the care plan section)    Visit Information  Last PT Received On: 04/25/13 History of Present Illness: Adm 2/15 with NSTEMI. Pt developed Lt heel pain 2/22. xray negative. Pt describes pain at plantar aspect of calcaneus and posterior aspect (where Achilles tendon inserts). PMHx includes DM    Subjective Data      Cognition  Cognition Arousal/Alertness: Awake/alert Behavior During Therapy: WFL for tasks assessed/performed Overall Cognitive Status: History of cognitive impairments - at baseline    Balance  Balance Overall balance assessment: Needs assistance Standing balance-Leahy Scale: Poor  End of Session PT - End of Session Activity Tolerance: Patient limited by fatigue Patient left: in chair;with call bell/phone within reach;with family/visitor present Nurse Communication: Mobility status   GP     Melford Aase 04/25/2013, 10:33 AM Elwyn Reach, [place]

## 2013-04-25 NOTE — Progress Notes (Signed)
Patient most appropriate for [facility] followup and noted to be arranged. [phone]

## 2013-04-25 NOTE — Consult Note (Signed)
Physical Medicine and Rehabilitation Consult  Reason for Consult: Left foot pain Referring Physician: Dr. Einar Gip   HPI: [name] is a 80 y.o. female with history of DM, HTN, A fib, admitted on 04/17/13 with chest pain radiating to her neck/jaw as well as fatigue. She underwent coronary angiography this evening by Dr. Quay Burow and was found to have severe two-vessel coronary artery disease with the culprit vessel RCA which is occluded and collateralized by the LAD which has an ulcerated complex mid LAD stenosis and mid to distal LAD high-grade stenosis. CABG recommended and Dr. Einar Gip consulted for second opinion on NSTEMI and treatment options. Patient elected to undergo PTCA with stenting of mid-LAD and DCCV unsuccessful to convert to NSR. She did convert to NSR the next day. She underwent thrombectomy of R-CA, PTCA and stenting of distal, mid and proximal RCA with drug eluting stent. Fluid overload treated with diuresis.   She was started on apixaban for A fib. Mobility limited due to difficulty weight bearing thorough left heel pain-- X rays without abnormality.  MD recommending CIR.     Review of Systems  Musculoskeletal: Positive for joint pain.  Skin: Positive for rash.  Neurological: Positive for focal weakness and weakness.    Past Medical History  Diagnosis Date  . Diabetes mellitus   . Hypertension   . Fibrocystic breast disease   . Meningioma   . Tendon tear, ankle    Past Surgical History  Procedure Laterality Date  . Breast lumpectomy    . Tonsillectomy    . Tendon tear    . Colonoscopy      05/25/2000. Results: Normal. Hemorrhoids  . Cardioversion N/A 04/18/2013    Procedure: CARDIOVERSION-BEDSIDE;  Surgeon: Laverda Page, MD;  Location: [facility] OR;  Service: Cardiovascular;  Laterality: N/A;   Family History  Problem Relation Age of Onset  . Cancer Mother   . Heart disease Father   . Stroke Father    Social History: Plans for d/c with family.   reports that she has never smoked. She does not have any smokeless tobacco history on file. She reports that she does not drink alcohol or use illicit drugs.   Allergies  Allergen Reactions  . Sulfonamide Derivatives Other (See Comments)    REACTION: childhood unknown type, ended up in hospital    Medications Prior to Admission  Medication Sig Dispense Refill  . aspirin 81 MG tablet Take 81 mg by mouth daily.        Marland Kitchen atenolol (TENORMIN) 100 MG tablet Take 100 mg by mouth every evening.      . bismuth subsalicylate (PEPTO BISMOL) 262 MG/15ML suspension Take 30 mLs by mouth every 6 (six) hours as needed for indigestion.      . calcium carbonate (TUMS - DOSED IN MG ELEMENTAL CALCIUM) 500 MG chewable tablet Chew 1 tablet by mouth daily as needed for indigestion or heartburn.      . Nutritional Supplements (HIGH-PROTEIN NUTRITIONAL SHAKE) LIQD Take 60 mLs by mouth daily as needed (for supplement).      . olmesartan-hydrochlorothiazide (BENICAR HCT) 40-12.5 MG per tablet Take 1 tablet by mouth daily.  30 tablet  6    Home: Home Living Family/patient expects to be discharged to:: Private residence Living Arrangements: Alone Available Help at Discharge: Family (to stay at daughter's home) Type of Home: House Home Access: Stairs to enter [employer] of Steps: 4 Home Layout: One level Home Equipment: None Additional Comments:  Discussed multiple family members' homes and all have stairs to enter. Daughters arrived at end of session and think a nephew may be able to assist/carry her up steps  Functional History:   Functional Status:  Mobility:     Ambulation/Gait Ambulation Distance (Feet): 10 Feet (10, 10, 2, 2, 3) General Gait Details: poor ability to [employer] Lt foot via use of UEs on RW due to weakness Number of Stairs: 1    ADL:    Cognition: Cognition Overall Cognitive Status: History of cognitive impairments - at baseline Orientation Level: Oriented  X4 Cognition Arousal/Alertness: Awake/alert Behavior During Therapy: WFL for tasks assessed/performed Overall Cognitive Status: History of cognitive impairments - at baseline Memory: Decreased short-term memory  Blood pressure 117/48, pulse 58, temperature 98.3 F (36.8 C), temperature source Oral, resp. rate 18, height 5\' 5"  (1.651 m), weight 84 kg (185 lb 3 oz), SpO2 95.00%. Physical Exam  Constitutional: No distress.  Lying in bed appears fatigued.   HENT:  Head: Normocephalic and atraumatic.  Eyes: EOM are normal. Pupils are equal, round, and reactive to light.  Neck: No JVD present. No tracheal deviation present. No thyromegaly present.  Cardiovascular: Normal rate.   Respiratory: No respiratory distress. She has no wheezes.  GI: She exhibits no distension. There is no tenderness.  Musculoskeletal:  Pain over left achilles tendon and insertion on calcaneus, some sclerotic changes noted there. Tender with Passive ADF.  Neurological: No cranial nerve deficit. Coordination normal.  Moves all 4's. No focal sensory findings. A little delayed with processing  Skin: Rash noted.    Results for orders placed during the hospital encounter of 04/16/13 (from the past 24 hour(s))  GLUCOSE, CAPILLARY     Status: None   Collection Time    04/24/13 11:41 AM      Result Value Ref Range   Glucose-Capillary 96  70 - 99 mg/dL  GLUCOSE, CAPILLARY     Status: None   Collection Time    04/24/13  4:20 PM      Result Value Ref Range   Glucose-Capillary 87  70 - 99 mg/dL  GLUCOSE, CAPILLARY     Status: Abnormal   Collection Time    04/24/13 10:05 PM      Result Value Ref Range   Glucose-Capillary 103 (*) 70 - 99 mg/dL  CBC     Status: Abnormal   Collection Time    04/25/13  5:18 AM      Result Value Ref Range   WBC 5.7  4.0 - 10.5 K/uL   RBC 3.95  3.87 - 5.11 MIL/uL   Hemoglobin 11.2 (*) 12.0 - 15.0 g/dL   HCT 32.5 (*) 36.0 - 46.0 %   MCV 82.3  78.0 - 100.0 fL   MCH 28.4  26.0 - 34.0 pg    MCHC 34.5  30.0 - 36.0 g/dL   RDW 12.5  11.5 - 15.5 %   Platelets 220  150 - 400 K/uL  GLUCOSE, CAPILLARY     Status: Abnormal   Collection Time    04/25/13  6:06 AM      Result Value Ref Range   Glucose-Capillary 101 (*) 70 - 99 mg/dL   Dg Foot 2 Views Left  04/24/2013   CLINICAL DATA:  Pain.  EXAM: LEFT FOOT - 2 VIEW  COMPARISON:  DG FOOT COMPLETE*L* dated 01/14/2010  FINDINGS: There is no evidence of fracture or dislocation. There is no evidence of arthropathy or other focal bone abnormality. Soft tissues  are unremarkable.  IMPRESSION: Negative.   Electronically Signed   By: Marcello Moores  Register   On: 04/24/2013 12:49    Assessment/Plan: Diagnosis: NSTEMI, left achilles tendinopathy/tendonitis 1. Does the need for close, 24 hr/day medical supervision in concert with the patient's rehab needs make it unreasonable for this patient to be served in a less intensive setting? NO 2. Co-Morbidities requiring supervision/potential complications: htn, obesity 3. Due to bladder management, bowel management, safety, skin/wound care, disease management, medication administration, pain management and patient education, does the patient require 24 hr/day rehab nursing? No 4. Does the patient require coordinated care of a physician, rehab nurse, PT (1-2 hrs/day, 5 days/week) and OT (1-2 hrs/day, 5 days/week) to address physical and functional deficits in the context of the above medical diagnosis(es)? No Addressing deficits in the following areas: balance, endurance, strength, transferring, bowel/bladder control, feeding and grooming 5. Can the patient actively participate in an intensive therapy program of at least 3 hrs of therapy per day at least 5 days per week? Potentially 6. The potential for patient to make measurable gains while on inpatient rehab is fair 7. Anticipated functional outcomes upon discharge from inpatient rehab are n/a with PT, n/a with OT, n/a with SLP. 8. Estimated rehab length of  stay to reach the above functional goals is: n/a 9. Does the patient have adequate social supports to accommodate these discharge functional goals? Potentially 10. Anticipated D/C setting: Home 11. Anticipated post D/C treatments: [place] therapy 12. Overall Rehab/Functional Prognosis: excellent  RECOMMENDATIONS: This patient's condition is appropriate for continued rehabilitative care in the following setting: [facility] Patient has agreed to participate in recommended program. Yes and Potentially Note that insurance prior authorization may be required for reimbursement for recommended care.  Comment: Recommend ice, stretching, and weight bearing left lower extremity. Could also try some voltaren gel to assist with pain control. It sounds by history that she injured the Achilles in the past, (probable tear per description from daughter). Should be able to return home when medically ready. Needs to get out of bed however!!  Meredith Staggers, MD, [place] [facility]      04/25/2013

## 2013-04-25 NOTE — Progress Notes (Addendum)
CARDIAC REHAB PHASE I   PRE:  Rate/Rhythm: 65 SR  BP:  Supine:   Sitting: 120/60  Standing:    SaO2: 95 RA  MODE:  Ambulation: 110 ft   POST:  Rate/Rhythm: 71  BP:  Supine:   Sitting: 110/70  Standing:    SaO2: 95 RA 1445-1515 Assisted X 2 and used walker to ambulate. Gait steady with walker. Pt c/o of left heel pain but was able to walk 110 feet without rest stop. Pt tired by end of walk. VS stable. Pt back to side of bed after walk. Completed discharge education with pt and daughter. They voice understanding. Pt was alert today and listened attentively to education.  Rodney Langton RN 04/25/2013 3:11 PM

## 2013-04-25 NOTE — Progress Notes (Signed)
Came to visit patient to offer [place] Management services. Consents signed. Left [facility] Care Management packet and contact information at bedside. Explained she will receive post hospital discharge call and will be evaluated for monthly home visits.  Marthenia Rolling, MSN- [facility] [phone]

## 2013-04-26 NOTE — Discharge Summary (Signed)
Physician Discharge Summary  Patient ID: [name] MRN: [id number] DOB/AGE: 78-Oct-1933 78 y.o.  Admit date: 04/16/2013 Discharge date: 04/25/2013  Primary Discharge Diagnosis Acute inferior NSTEMI Paroxysmal A. Fibrillation with RVR Acute pulmonary edema Secondary Discharge Diagnosis Hypertension Mixed hyperlipidemia Moderate obesity Deconditioning. Drug rash probably due to Ultram.  Significant Diagnostic Studies:  04/18/2013: PTCA and stenting of the mid LAD with implantation of a 3.0 x 15 mm Xience Alpine, overlap in the proximal to mid segment with a 3.0 x 38 mm Xience Alpine eluting stent. 04/18/2013: direct current cardioversion, failed to convert from atrial fibrillation to normal sinus rhythm. 2/80/2015: Thrombectomy of the right coronary artery for a thrombotic lesion in the distal RCA, PTCA and stenting of the distal, mid and proximal RCA with implantation of overlapping 3.0 x 38 mm distally and a 3.0 x 28 mm promus Premier drug-eluting stent. Echocardiogram 2/60/2014: Left ventricle systolic function was dynamic with ejection fraction of 64-33%, grade 1 diastolic dysfunction with elevated LVEDP.  No significant valvular abnormality.   Hospital Course:  [name] is a 80 year old Caucasian female with history of hypertension, hyperlipidemia and prediabetes, admitted to the hospital on 04/16/2013 with non-ST elevation myocardial infarction.  She underwent coronary angiography on 04/17/2013 and was found to have severe two-vessel coronary artery disease and was recommended CABG.  Him over to review of the cineangiograms, I was asked to give a second opinion, I felt that proceeding with angioplasty would be more appropriate in a patient was 80 years of age, moderate obesity, deconditioning and mild dementia.  The following morning she underwent successful angioplasty to the LAD followed by repeat coronary angiography on 04/19/2013 and angioplasty to the right coronary  artery.  She also developed atrial fibrillation with rapid ventricular response on 04/18/2013 and had significant ST elevation in the inferior leads and reciprocal ST depression in anterior and lateral leads.  She underwent urgent coronary angiography followed by angioplasty to the LAD which gave collaterals to the occluded RCA, RCA being the culprit vessel.  Because of high-grade stenosis to the LAD, it is felt necessary that revascularization of the LAD would be protective to the collateralized RCA.  Patient did well, but because of recurrence of atrial fibrillation after she had converted back to sinus rhythm within 24 hours, patient was started on Eliquis along with changing length her to Plavix and discontinuation of aspirin.  Due to hypotension/borderline low blood pressure, patient was also started on amiodarone.  She was eventually transferred to telemetry, where she had difficulty with ambulation and complained of severe left foot pain.  This delayed her progress, eventually physical medicine was consulted who did not think it was anything significant with regard to pain in her left foot, and is suspected that she may have Achillis tendinitis.  Overall she was felt stable for discharge without patient physical therapy.  She remained in sinus rhythm without any recurrence.   Recommendations on discharge: I will consider performing event monitoring, if there is no recurrence of atrial fibrillation will consider discontinuation of Eliquis.  Patient with atrial fibrillation, suspected due to acute myocardial ischemia and pulmonary edema.  Continued risk factor management along with aggressive control of hypertension and hyperlipidemia is indicated.  Discharge Exam: Blood pressure 124/56, pulse 67, temperature 98 F (36.7 C), temperature source Oral, resp. rate 18, height 5\' 5"  (1.651 m), weight 84 kg (185 lb 3 oz), SpO2 98.00%.   General appearance: alert, cooperative, appears stated age, no  distress and moderately obese, tachypnoeic.  Lungs: clear to auscultation bilaterally  Chest wall: no tenderness  Heart: regular rate and rhythm, S1, S2 normal, no murmur, click, rub or gallop  Abdomen: Obese, soft, non-tender; bowel sounds normal; no masses, no organomegaly  Extremities: extremities normal, atraumatic, no cyanosis or edema  Pulses: 2+ and symmetric.  Labs:   Lab Results  Component Value Date   WBC 5.7 04/25/2013   HGB 11.2* 04/25/2013   HCT 32.5* 04/25/2013   MCV 82.3 04/25/2013   PLT 220 04/25/2013    Recent Labs Lab 04/22/13 0125  NA 136*  K 4.4  CL 101  CO2 27  BUN 18  CREATININE 0.74  CALCIUM 8.9  GLUCOSE 111*   Lab Results  Component Value Date   CKTOTAL 38 09/13/2010   CKMB 1.2 09/13/2010   TROPONINI 10.59* 04/17/2013    Lipid Panel     Component Value Date/Time   CHOL 298* 04/05/2013 1106   TRIG 274.0* 04/05/2013 1106   HDL 42.50 04/05/2013 1106   CHOLHDL 7 04/05/2013 1106   VLDL 54.8* 04/05/2013 1106   LDLCALC 156* 09/13/2010 0214    EKG:  EKG 04/19/2013: Normal sinus rhythm at the rate of 63 bpm, normal axis. Inferior nonspecific ST-T wave changes, cannot exclude ischemia. Anterior ST segment depression with T-wave inversion consistent with ischemia. Low voltage complexes. Compared to EKG of 04/18/2013, atrial fibrillation is no longer evident. Compared to yesterday's EKG at 9:17 AM, no significant change in ST-T wave abnormality.   Radiology: Dg Chest 2 View  04/16/2013   CLINICAL DATA:  Chest pain and hypertension.  EXAM: CHEST  2 VIEW  COMPARISON:  None.  FINDINGS: Heart size is mildly enlarged. Lungs clear. No pneumothorax pleural effusion. No focal bony abnormality.  IMPRESSION: No acute disease.   Electronically Signed   By: Inge Rise M.D.   On: 04/16/2013 20:50   Dg Chest Port 1v Same Day  04/20/2013   CLINICAL DATA:  Shortness of breath, weakness  EXAM: PORTABLE CHEST - 1 VIEW SAME DAY  COMPARISON:  DG CHEST 2 VIEW dated 04/16/2013  FINDINGS:  Grossly unchanged cardiac silhouette and mediastinal contours given decreased lung volumes and AP projection. Pulmonary vasculature is less distinct with cephalization of flow. Interval development of small bilateral pleural effusions and associated worsening of bibasilar opacities. No pneumothorax. Grossly unchanged bones including stigmata of DISH within the thoracic spine.  IMPRESSION: Findings compatible with pulmonary edema, small bilateral effusions and associated bibasilar opacities, atelectasis versus infiltrate. A follow-up chest radiograph in 4 to 6 weeks after treatment is recommended to ensure resolution.   Electronically Signed   By: Sandi Mariscal M.D.   On: 04/20/2013 08:01   Dg Foot 2 Views Left  04/24/2013   CLINICAL DATA:  Pain.  EXAM: LEFT FOOT - 2 VIEW  COMPARISON:  DG FOOT COMPLETE*L* dated 01/14/2010  FINDINGS: There is no evidence of fracture or dislocation. There is no evidence of arthropathy or other focal bone abnormality. Soft tissues are unremarkable.  IMPRESSION: Negative.   Electronically Signed   By: Marcello Moores  Register   On: 04/24/2013 12:49      FOLLOW UP PLANS AND APPOINTMENTS Discharge Orders   Future Orders Complete By Expires   Amb Referral to Cardiac Rehabilitation  As directed        Medication List    STOP taking these medications       olmesartan-hydrochlorothiazide 40-12.5 MG per tablet  Commonly known as:  BENICAR HCT      TAKE these  medications       ALPRAZolam 0.5 MG tablet  Commonly known as:  XANAX  Take 1 tablet (0.5 mg total) by mouth 3 (three) times daily as needed for anxiety.     apixaban 5 MG Tabs tablet  Commonly known as:  ELIQUIS  Take 1 tablet (5 mg total) by mouth 2 (two) times daily.     aspirin 81 MG tablet  Take 81 mg by mouth daily.     atenolol 100 MG tablet  Commonly known as:  TENORMIN  Take 0.5 tablets (50 mg total) by mouth every evening.     atorvastatin 80 MG tablet  Commonly known as:  LIPITOR  Take 1 tablet (80  mg total) by mouth daily at 6 PM.     bismuth subsalicylate 347 QQ/59DG suspension  Commonly known as:  PEPTO BISMOL  Take 30 mLs by mouth every 6 (six) hours as needed for indigestion.     calcium carbonate 500 MG chewable tablet  Commonly known as:  TUMS - dosed in mg elemental calcium  Chew 1 tablet by mouth daily as needed for indigestion or heartburn.     clopidogrel 75 MG tablet  Commonly known as:  PLAVIX  Take 1 tablet (75 mg total) by mouth daily with breakfast.     diphenhydrAMINE 12.5 MG/5ML elixir  Commonly known as:  BENADRYL  Take 5 mLs (12.5 mg total) by mouth every 6 (six) hours as needed for itching.     famotidine 40 MG tablet  Commonly known as:  PEPCID  Take 1 tablet (40 mg total) by mouth daily.     HIGH-PROTEIN NUTRITIONAL SHAKE Liqd  Take 60 mLs by mouth daily as needed (for supplement).     hydrocortisone cream 1 %  Apply topically as needed for itching.     INVESTIGATIONAL DRUG SIMPLE RECORD  Take 7.5 mg by mouth 2 (two) times daily.     nitroGLYCERIN 0.4 MG SL tablet  Commonly known as:  NITROSTAT  Place 1 tablet (0.4 mg total) under the tongue every 5 (five) minutes x 3 doses as needed for chest pain.           Follow-up Information   Follow up with Laverda Page, MD. (Call our office to schedule appointment in 10 days. Bring all medications.)    Specialty:  Cardiology   Contact information:   [address] [place] [address] [phone]        Laverda Page, MD 04/26/2013, 9:28 PM  Pager: [phone] Office: [phone] If no answer: [phone]

## 2013-04-30 ENCOUNTER — Encounter: Payer: Self-pay | Admitting: Cardiology

## 2013-05-02 ENCOUNTER — Other Ambulatory Visit: Payer: Self-pay | Admitting: *Deleted

## 2013-05-02 ENCOUNTER — Telehealth: Payer: Self-pay | Admitting: Internal Medicine

## 2013-05-02 NOTE — Telephone Encounter (Signed)
[facility] called to fu on pt's med. Pt was rx ALPRAZolam (XANAX) 0.5 MG tablet in the hospital, but it was never sent to pharm. Therapist states she isn't sure if pt even wants this drug. Therapist Akiki called  Dr Laverda Page, MD office and they told her to call us. Pt is not taking lipitor anymore b/c she doesn't want to. Pt states she doesn't believe in statin drugs Pt also rx INVESTIGATIONAL DRUG SIMPLE RECORD in the hospital and pt has refused to take this also.

## 2013-05-02 NOTE — Telephone Encounter (Signed)
Script was given to pt and she didn't take pharmacy because she doesn't want to take

## 2013-05-04 ENCOUNTER — Telehealth: Payer: Self-pay | Admitting: Internal Medicine

## 2013-05-04 NOTE — Telephone Encounter (Signed)
[facility] calling needing the results of the pt's a1c please fax to [phone].

## 2013-05-04 NOTE — Telephone Encounter (Signed)
Lab faxed

## 2013-06-22 ENCOUNTER — Encounter (HOSPITAL_COMMUNITY)
Admission: RE | Admit: 2013-06-22 | Discharge: 2013-06-22 | Disposition: A | Discharge status: Home / Self Care | Payer: Medicare HMO | Source: Ambulatory Visit | Attending: Cardiology | Admitting: Cardiology

## 2013-06-22 DIAGNOSIS — Z5189 Encounter for other specified aftercare: Secondary | ICD-10-CM | POA: Insufficient documentation

## 2013-06-22 DIAGNOSIS — I252 Old myocardial infarction: Secondary | ICD-10-CM | POA: Insufficient documentation

## 2013-06-22 DIAGNOSIS — I2510 Atherosclerotic heart disease of native coronary artery without angina pectoris: Secondary | ICD-10-CM | POA: Insufficient documentation

## 2013-06-22 DIAGNOSIS — Z9861 Coronary angioplasty status: Secondary | ICD-10-CM | POA: Insufficient documentation

## 2013-06-22 NOTE — Progress Notes (Signed)
Cardiac Rehab Medication Review by a Pharmacist  Does the patient  feel that his/her medications are working for him/her?  yes  Has the patient been experiencing any side effects to the medications prescribed?  Yes; has no taste and losing appetite shortly after hospital visit in February.  Reported that has mentioned to her MD, encouraged her to do so.  Does the patient measure his/her own blood pressure or blood glucose at home?  No; thinks BP cuff not working at home.    Does the patient have any problems obtaining medications due to transportation or finances?   no  Understanding of regimen: good Understanding of indications: good Potential of compliance: good    Pharmacist comments: Patient does not take additional OTC/Supplement/eye or ear drops or ointments.  Has no questions at this time.  Jeronimo Norma, PharmD Clinical Pharmacist Resident Pager: [phone]   Jeronimo Norma 06/22/2013 9:01 AM

## 2013-06-26 ENCOUNTER — Encounter (HOSPITAL_COMMUNITY): Payer: Medicare HMO

## 2013-06-28 ENCOUNTER — Encounter (HOSPITAL_COMMUNITY)
Admission: RE | Admit: 2013-06-28 | Discharge: 2013-06-28 | Disposition: A | Discharge status: Home / Self Care | Payer: Medicare HMO | Source: Ambulatory Visit | Attending: Cardiology | Admitting: Cardiology

## 2013-06-28 LAB — GLUCOSE, CAPILLARY: Glucose-Capillary: 128 mg/dL — ABNORMAL HIGH (ref 70–99)

## 2013-06-28 NOTE — Progress Notes (Signed)
Pt started cardiac rehab today.  Pt tolerated light exercise without difficulty. Telemetry rhythm Sinus with t wave inversion this has been previously documented on a office 12 lead on 05/12/2013.   Vital signs stable. Shelisha plans only to complete 6 sessions of cardiac rehab due to insurance copay. Will continue to monitor the patient throughout  the program.

## 2013-06-30 ENCOUNTER — Encounter (HOSPITAL_COMMUNITY)
Admission: RE | Admit: 2013-06-30 | Discharge: 2013-06-30 | Disposition: A | Discharge status: Home / Self Care | Payer: Medicare HMO | Source: Ambulatory Visit | Attending: Cardiology | Admitting: Cardiology

## 2013-06-30 DIAGNOSIS — Z9861 Coronary angioplasty status: Secondary | ICD-10-CM | POA: Insufficient documentation

## 2013-06-30 DIAGNOSIS — I214 Non-ST elevation (NSTEMI) myocardial infarction: Secondary | ICD-10-CM | POA: Insufficient documentation

## 2013-06-30 LAB — GLUCOSE, CAPILLARY
GLUCOSE-CAPILLARY: 92 mg/dL (ref 70–99)
Glucose-Capillary: 128 mg/dL — ABNORMAL HIGH (ref 70–99)

## 2013-07-03 ENCOUNTER — Encounter (HOSPITAL_COMMUNITY)
Admission: RE | Admit: 2013-07-03 | Discharge: 2013-07-03 | Disposition: A | Discharge status: Home / Self Care | Payer: Medicare HMO | Source: Ambulatory Visit | Attending: Cardiology | Admitting: Cardiology

## 2013-07-05 ENCOUNTER — Encounter (HOSPITAL_COMMUNITY)
Admission: RE | Admit: 2013-07-05 | Discharge: 2013-07-05 | Disposition: A | Discharge status: Home / Self Care | Payer: Medicare HMO | Source: Ambulatory Visit | Attending: Cardiology | Admitting: Cardiology

## 2013-07-07 ENCOUNTER — Encounter (HOSPITAL_COMMUNITY)
Admission: RE | Admit: 2013-07-07 | Discharge: 2013-07-07 | Disposition: A | Discharge status: Home / Self Care | Payer: Medicare HMO | Source: Ambulatory Visit | Attending: Cardiology | Admitting: Cardiology

## 2013-07-10 ENCOUNTER — Other Ambulatory Visit: Payer: Self-pay | Admitting: Internal Medicine

## 2013-07-10 ENCOUNTER — Encounter (HOSPITAL_COMMUNITY)
Admission: RE | Admit: 2013-07-10 | Discharge: 2013-07-10 | Disposition: A | Discharge status: Home / Self Care | Payer: Medicare HMO | Source: Ambulatory Visit | Attending: Cardiology | Admitting: Cardiology

## 2013-07-10 NOTE — Progress Notes (Signed)
Reviewed home exercise with pt today.  Pt plans to walk and return to [facility] for exercise.  Reviewed THR, pulse, RPE, sign and symptoms, NTG use, and when to call 911 or MD.  Pt voiced understanding. Pt graduates today after six sessions due to insurance. Alberteen Sam, MA, ACSM RCEP

## 2013-07-12 ENCOUNTER — Encounter (HOSPITAL_COMMUNITY): Payer: Medicare HMO

## 2013-09-26 ENCOUNTER — Telehealth: Payer: Self-pay | Admitting: Internal Medicine

## 2013-09-26 NOTE — Telephone Encounter (Signed)
Done  Authorization #[id number]/28/2013 - 03/31/2014 submit a referral to  Dr. Adrian Prows MD [address], [place],  [address] [phone] Their office will get a copy of the authorization

## 2013-09-26 NOTE — Telephone Encounter (Signed)
Pt has [employer] silver back needs referral to dr.  Hassan Rowan #[id number] Pt has fu appt, dx 414.01, appt is 09/28/13 at 9:00am.

## 2013-09-27 ENCOUNTER — Telehealth: Payer: Self-pay | Admitting: Internal Medicine

## 2013-09-27 NOTE — Telephone Encounter (Signed)
Pt returned my call. Pt has appt w/ dr Einar Gip at 9 am on thurs , September 28, 2013 [address], [place], [place] [address]  Pt is not sure if the referral has been done. Pt states she is supposed to have 6 visits.  Can you check into this, or do you know? Thanks

## 2013-09-27 NOTE — Telephone Encounter (Signed)
Done its for 6 visits done on  09-26-2013 Authorization #[id number]/28/2013 - 03/31/2014  submit a referral to  Dr. Adrian Prows MD  [address], [place], [place] [address]  [phone]  Their office will get a copy of the authorization

## 2013-09-27 NOTE — Telephone Encounter (Signed)
Baker Janus from [employer] called bc she received a call yesterday from pt requesting a referral to dr Einar Gip. Pt states she has appt w/ dr Einar Gip tomorrow, Tried to call pt, no answer. Left message.

## 2014-01-15 ENCOUNTER — Telehealth: Payer: Self-pay | Admitting: Internal Medicine

## 2014-01-15 MED ORDER — ATENOLOL 100 MG PO TABS: ORAL_TABLET | ORAL | Status: DC

## 2014-01-15 NOTE — Telephone Encounter (Signed)
[id number] - [place], [place]. Is requesting re-fill on atenolol (TENORMIN) 100 MG tablet

## 2014-01-15 NOTE — Telephone Encounter (Signed)
Pt has an appt with Dr Yong Channel to establish in March.  Ok x3 per Dr Arnoldo Morale, [facility]

## 2014-02-01 ENCOUNTER — Other Ambulatory Visit: Payer: Self-pay | Admitting: Obstetrics and Gynecology

## 2014-02-01 ENCOUNTER — Other Ambulatory Visit: Payer: Self-pay

## 2014-02-01 DIAGNOSIS — N644 Mastodynia: Secondary | ICD-10-CM

## 2014-02-08 ENCOUNTER — Encounter (HOSPITAL_COMMUNITY): Payer: Self-pay | Admitting: Cardiovascular Disease

## 2014-02-14 ENCOUNTER — Other Ambulatory Visit: Payer: Medicare HMO

## 2014-02-19 ENCOUNTER — Ambulatory Visit
Admission: RE | Admit: 2014-02-19 | Discharge: 2014-02-19 | Disposition: A | Discharge status: Home / Self Care | Payer: Commercial Managed Care - HMO | Source: Ambulatory Visit | Attending: Obstetrics and Gynecology | Admitting: Obstetrics and Gynecology

## 2014-02-19 DIAGNOSIS — N644 Mastodynia: Secondary | ICD-10-CM

## 2014-04-12 DIAGNOSIS — E782 Mixed hyperlipidemia: Secondary | ICD-10-CM | POA: Diagnosis not present

## 2014-04-12 DIAGNOSIS — I10 Essential (primary) hypertension: Secondary | ICD-10-CM | POA: Diagnosis not present

## 2014-04-12 DIAGNOSIS — R739 Hyperglycemia, unspecified: Secondary | ICD-10-CM | POA: Diagnosis not present

## 2014-04-12 LAB — HEMOGLOBIN A1C: HEMOGLOBIN A1C: 5.8 % (ref 4.0–6.0)

## 2014-04-12 LAB — HEPATIC FUNCTION PANEL
ALK PHOS: 85 U/L (ref 25–125)
ALT: 13 U/L (ref 7–35)
AST: 14 U/L (ref 13–35)
BILIRUBIN, TOTAL: 0.6 mg/dL

## 2014-04-12 LAB — LIPID PANEL
Cholesterol: 304 mg/dL — AB (ref 0–200)
HDL: 47 mg/dL (ref 35–70)
LDL Cholesterol: 123 mg/dL
Triglycerides: 166 mg/dL — AB (ref 40–160)

## 2014-04-12 LAB — BASIC METABOLIC PANEL
BUN: 12 mg/dL (ref 4–21)
Creatinine: 0.8 mg/dL (ref <=1.1)
GLUCOSE: 95 mg/dL
Sodium: 143 mmol/L (ref 137–147)

## 2014-04-18 DIAGNOSIS — I25119 Atherosclerotic heart disease of native coronary artery with unspecified angina pectoris: Secondary | ICD-10-CM | POA: Diagnosis not present

## 2014-04-18 DIAGNOSIS — I10 Essential (primary) hypertension: Secondary | ICD-10-CM | POA: Diagnosis not present

## 2014-04-18 DIAGNOSIS — E782 Mixed hyperlipidemia: Secondary | ICD-10-CM | POA: Diagnosis not present

## 2014-04-18 DIAGNOSIS — K921 Melena: Secondary | ICD-10-CM | POA: Diagnosis not present

## 2014-04-23 DIAGNOSIS — R5381 Other malaise: Secondary | ICD-10-CM | POA: Diagnosis not present

## 2014-04-23 DIAGNOSIS — I25119 Atherosclerotic heart disease of native coronary artery with unspecified angina pectoris: Secondary | ICD-10-CM | POA: Diagnosis not present

## 2014-05-03 ENCOUNTER — Encounter: Payer: Self-pay | Admitting: Family Medicine

## 2014-05-10 ENCOUNTER — Ambulatory Visit (INDEPENDENT_AMBULATORY_CARE_PROVIDER_SITE_OTHER): Payer: Commercial Managed Care - HMO | Admitting: Family Medicine

## 2014-05-10 ENCOUNTER — Encounter: Payer: Self-pay | Admitting: Family Medicine

## 2014-05-10 DIAGNOSIS — H9193 Unspecified hearing loss, bilateral: Secondary | ICD-10-CM | POA: Diagnosis not present

## 2014-05-10 DIAGNOSIS — D320 Benign neoplasm of cerebral meninges: Secondary | ICD-10-CM | POA: Diagnosis not present

## 2014-05-10 DIAGNOSIS — R413 Other amnesia: Secondary | ICD-10-CM | POA: Diagnosis not present

## 2014-05-10 NOTE — Progress Notes (Signed)
Autumn Reddish, MD Phone: [phone]  Subjective:  Patient presents today to establish care with me as their new primary care provider. Patient was formerly a patient of Dr. Arnoldo Morale. Chief complaint-noted.   Memory Loss-newly reported Hearing Loss-newly reported Family has noted memory difficulties over about 1-2 years and seemed to be worse after her NSTEMI. Sometimes she has trouble finding meanings of simple words such as pizza. Other times she is extremely sharp. They also have noted hearing difficulties in this time where she has difficulty maintaining conversations. She does take a statin and they are worried about its effects on her memory.  Finally, she has been struggling to fully regain her full strength after the MI but slowly seems to be improving. She also has a known stable meningioma.    ROS- no headaches, blurry vision, extremity weakness.   The following were reviewed and entered/updated in epic: Past Medical History  Diagnosis Date  . Diabetes mellitus   . Hypertension   . Fibrocystic breast disease   . Meningioma   . Tendon tear, ankle   . SEBACEOUS CYST, NECK 02/08/2007    Qualifier: Diagnosis of  By: Arnoldo Morale MD, John E   . Cellulitis and abscess of neck 03/18/2007    Qualifier: Diagnosis of  By: Arnoldo Morale MD, Balinda Quails   . Allergic rhinitis due to other allergen 06/14/2008    Qualifier: Diagnosis of  By: Arnoldo Morale MD, Balinda Quails    Patient Active Problem List   Diagnosis Date Noted  . Coronary atherosclerosis of native coronary artery 04/18/2013    Priority: High  . Paroxysmal atrial fibrillation 04/18/2013    Priority: High  . NSTEMI (non-ST elevated myocardial infarction) 04/16/2013    Priority: High  . Memory loss 05/10/2014    Priority: Medium  . Mixed hyperlipidemia 04/23/2013    Priority: Medium  . Essential hypertension 09/21/2006    Priority: Medium  . Hyperglycemia 01/21/2010    Priority: Low  . Osteopenia 06/19/2009    Priority: Low  . NECK PAIN  04/30/2008    Priority: Low  . MENINGIOMA 09/21/2006    Priority: Low  . FIBROCYSTIC BREAST DISEASE 09/21/2006    Priority: Low   Past Surgical History  Procedure Laterality Date  . Breast lumpectomy    . Tonsillectomy    . Tendon tear    . Colonoscopy      05/25/2000. Results: Normal. Hemorrhoids  . Cardioversion N/A 04/18/2013    Procedure: CARDIOVERSION-BEDSIDE;  Surgeon: Laverda Page, MD;  Location: [facility];  Service: Cardiovascular;  Laterality: N/A;  . Left heart catheterization with coronary angiogram N/A 04/17/2013    Procedure: LEFT HEART CATHETERIZATION WITH CORONARY ANGIOGRAM;  Surgeon: Lorretta Harp, MD;  Location: [facility] CATH LAB;  Service: Cardiovascular;  Laterality: N/A;  . Percutaneous coronary stent intervention (pci-s) N/A 04/18/2013    Procedure: PERCUTANEOUS CORONARY STENT INTERVENTION (PCI-S);  Surgeon: Laverda Page, MD;  Location: [facility] CATH LAB;  Service: Cardiovascular;  Laterality: N/A;  . Percutaneous coronary stent intervention (pci-s) N/A 04/19/2013    Procedure: PERCUTANEOUS CORONARY STENT INTERVENTION (PCI-S);  Surgeon: Laverda Page, MD;  Location: [facility] CATH LAB;  Service: Cardiovascular;  Laterality: N/A;    Family History  Problem Relation Age of Onset  . Cancer Mother   . Heart disease Father   . Stroke Father    Medications- reviewed and updated Current Outpatient Prescriptions  Medication Sig Dispense Refill  . apixaban (ELIQUIS) 5 MG TABS tablet Take 1 tablet (5 mg total)  by mouth 2 (two) times daily. 60 tablet 2  . Ascorbic Acid (VITAMIN C) 100 MG tablet Take 100 mg by mouth daily.    Marland Kitchen aspirin 81 MG tablet Take 81 mg by mouth daily.      Marland Kitchen atenolol (TENORMIN) 100 MG tablet TAKE 1 TABLET BY MOUTH ONCE DAILY 30 tablet 3  . atorvastatin (LIPITOR) 80 MG tablet Take 1 tablet (80 mg total) by mouth daily at 6 PM. 30 tablet 1  . calcium carbonate (TUMS - DOSED IN MG ELEMENTAL CALCIUM) 500 MG chewable tablet Chew 1 tablet by mouth daily as  needed for indigestion or heartburn.    . INVESTIGATIONAL DRUG SIMPLE RECORD Take 7.5 mg by mouth 2 (two) times daily. (Patient not taking: Reported on 05/10/2014)    . nitroGLYCERIN (NITROSTAT) 0.4 MG SL tablet Place 1 tablet (0.4 mg total) under the tongue every 5 (five) minutes x 3 doses as needed for chest pain. (Patient not taking: Reported on 05/10/2014) 30 tablet 2  . Omega-3 Fatty Acids (FISH OIL) 1000 MG CAPS Take 1 capsule by mouth daily.     Allergies-reviewed and updated Allergies  Allergen Reactions  . Sulfonamide Derivatives Other (See Comments)    REACTION: childhood unknown type, ended up in hospital    History   Social History  . Marital Status: Widowed    Spouse Name: N/A  . Number of Children: N/A  . Years of Education: N/A   Social History Main Topics  . Smoking status: Never Smoker   . Smokeless tobacco: Not on file  . Alcohol Use: No  . Drug Use: No  . Sexual Activity: No   Other Topics Concern  . None   Social History Narrative    ROS--See HPI   Objective: BP 118/64 mmHg  Pulse 64  Temp(Src) 98 F (36.7 C)  Wt 187 lb (84.823 kg) Gen: NAD, resting comfortably HEENT: Mucous membranes are moist. Oropharynx normal. TM normal. CV: RRR no murmurs rubs or gallops Lungs: CTAB no crackles, wheeze, rhonchi Abdomen: soft/nontender/nondistended/normal bowel sounds.  Ext: trace edema Neuro: grossly normal, moves all extremities, PERRLA  MMSE 30/30   Assessment/Plan:  Memory loss Reported issue from primarily family but some from patient. MMSE 30/30 and I may add patient was very quick in her answers. We discussed following this every 6-12 months. I doubt this is statin related and patient desperately needs statin given her history NSTEMI  I have slightly higher concern about reported hearing loss difficulties. I wonder if her difficulty hearing has been perceived as memory loss. I have referred her to ENT for further evaluation. She does have a known  meningioma available on PACs but this is frontal and 22mm and I very strongly doubt any issues with memory loss or hearing coming from this but would ask for ENTs confirmation of this theory and thoguhts on if she needs repeat imaging (could also consider under neurology).   6 month follow up.   Orders Placed This Encounter  Procedures  . Ambulatory referral to ENT    Referral Priority:  Routine    Referral Type:  Consultation    Referral Reason:  Specialty Services Required    Requested Specialty:  Otolaryngology    Number of Visits Requested:  1

## 2014-05-10 NOTE — Assessment & Plan Note (Signed)
Reported issue from primarily family but some from patient. MMSE 30/30 and I may add patient was very quick in her answers. We discussed following this every 6-12 months.   I have slightly higher concern about reported hearing difficulties. I wonder if her difficulty hearing has been perceived as memory loss. I have referred her to ENT for further evaluation. She does have a known meningioma available on PACs but this is frontal and 18mm and I very strongly doubt any issues with memory loss or hearing coming from this but would ask for ENTs confirmation of this theory and thoguhts on if she needs repeat imaging (could also consider under neurology).

## 2014-05-10 NOTE — Patient Instructions (Signed)
Memory seems to be pretty sharp (not perfect but sharp)  Weakness should hopefully continue to improve  Overall I think you are doing pretty well but I really want you to take the atorvastatin.   Finally, refer you to ENT for evaluation of hearing loss

## 2014-05-21 ENCOUNTER — Other Ambulatory Visit: Payer: Self-pay

## 2014-05-21 MED ORDER — ATENOLOL 100 MG PO TABS: ORAL_TABLET | ORAL | Status: DC

## 2014-05-21 NOTE — Telephone Encounter (Signed)
Rx request for Atenolol 100 mg tablet-Take 1 tablet by mouth once daily #30  Pharm:  [facility] [address]

## 2014-05-23 ENCOUNTER — Other Ambulatory Visit: Payer: Self-pay | Admitting: Family Medicine

## 2014-06-25 IMAGING — CR DG CERVICAL SPINE COMPLETE 4+V
5 series · 5 of 5 positions shown · non-contrast
Comparison: None.

CLINICAL DATA: Right-sided neck pain.

CERVICAL SPINE - COMPLETE 4+ VIEW

[view not recorded (1 of 5)]
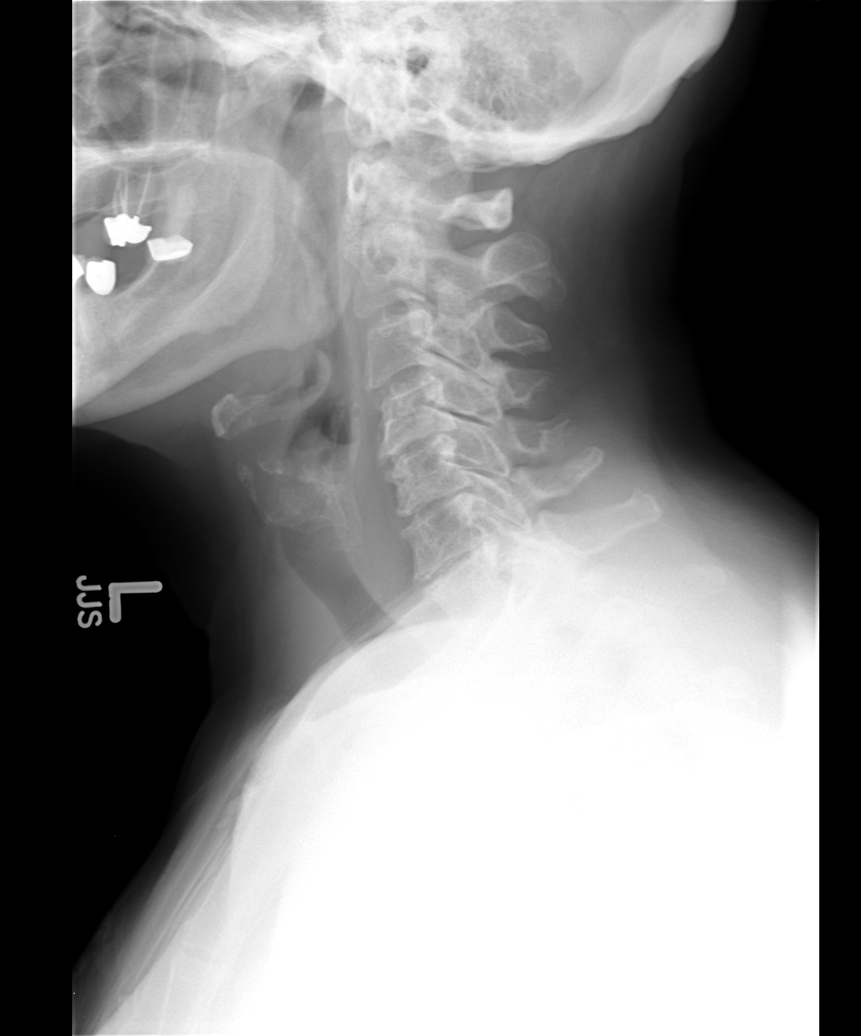

[view not recorded (2 of 5)]
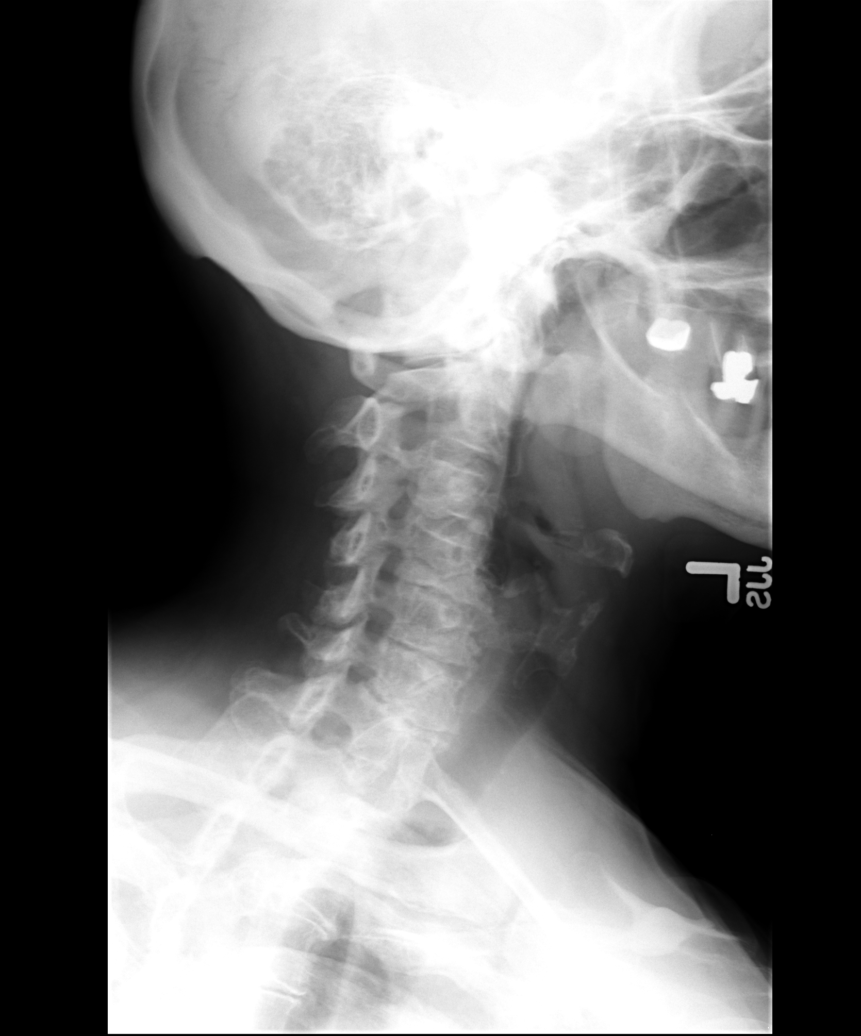

[view not recorded (3 of 5)]
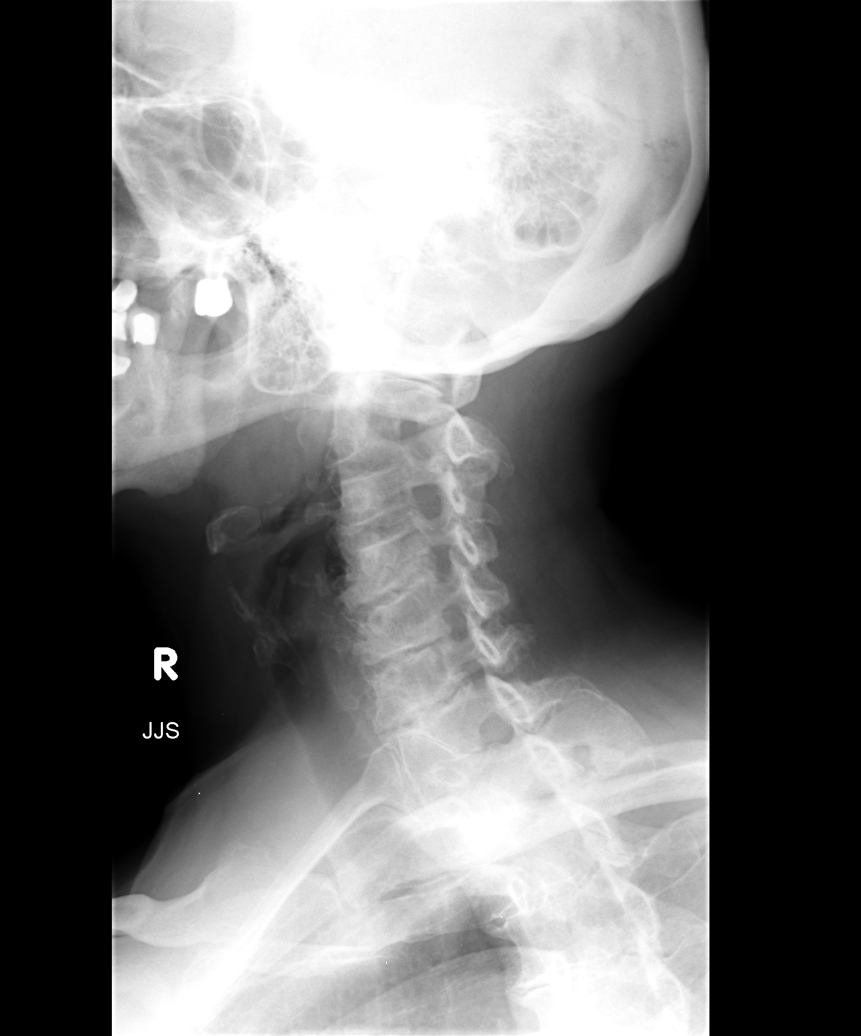

[view not recorded (4 of 5)]
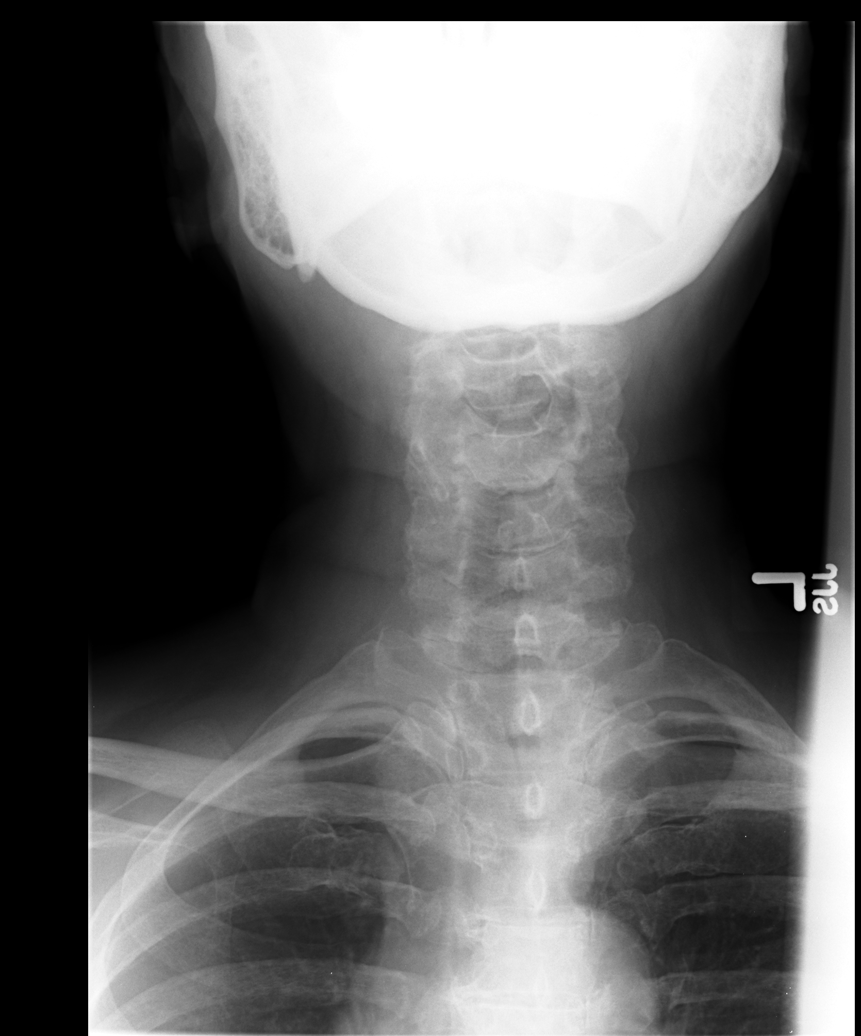

[view not recorded (5 of 5)]
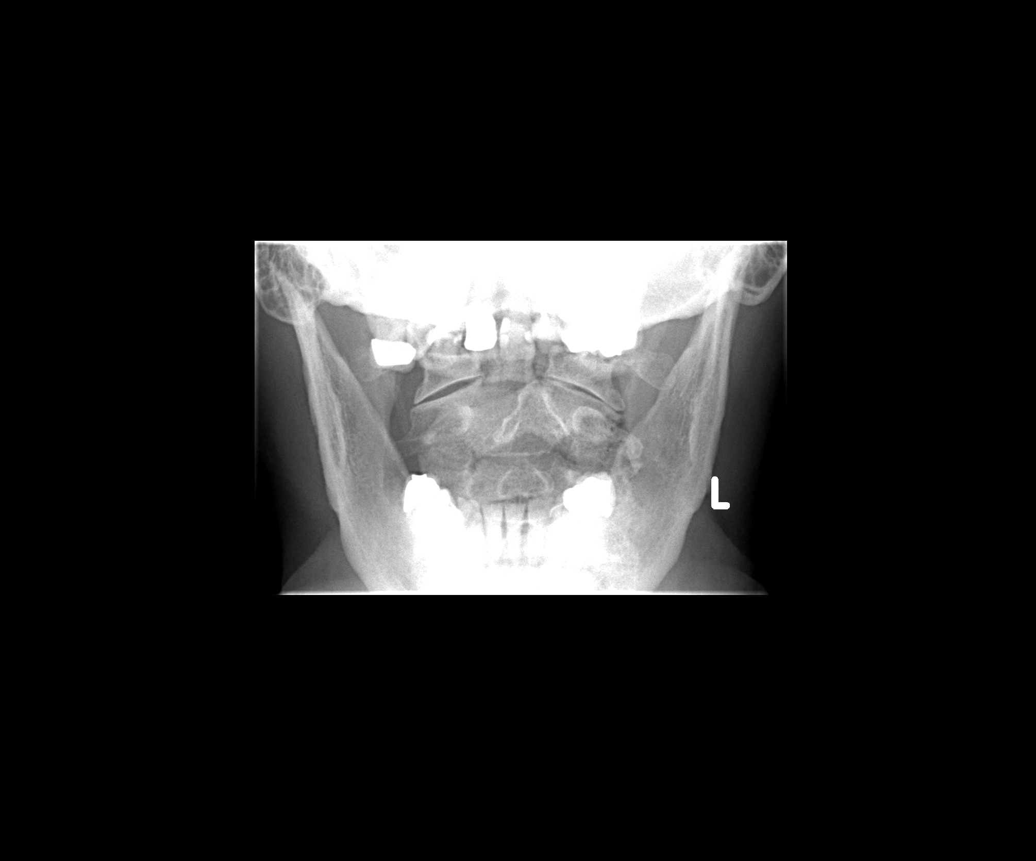

[5 of 5 positions shown; findings below may reference images not displayed]

FINDINGS: There is significant disc space narrowing at C4-5, C5-6,
and C6-7 with osteophyte formation.  Cervical facet arthropathy is
present with anatomic alignment.  There is no prevertebral soft
tissue swelling or destructive osseous lesions.  The lung apices
are clear.  The odontoid is partially visualized and unremarkable.
Bilateral neural foraminal narrowing is noted, most pronounced at
C4-5 and C6-7.
IMPRESSION: Cervical spondylosis as described.

## 2014-07-06 DIAGNOSIS — R5383 Other fatigue: Secondary | ICD-10-CM | POA: Diagnosis not present

## 2014-07-06 DIAGNOSIS — R5381 Other malaise: Secondary | ICD-10-CM | POA: Diagnosis not present

## 2014-07-12 DIAGNOSIS — E782 Mixed hyperlipidemia: Secondary | ICD-10-CM | POA: Diagnosis not present

## 2014-07-12 DIAGNOSIS — R739 Hyperglycemia, unspecified: Secondary | ICD-10-CM | POA: Diagnosis not present

## 2014-07-12 DIAGNOSIS — I25119 Atherosclerotic heart disease of native coronary artery with unspecified angina pectoris: Secondary | ICD-10-CM | POA: Diagnosis not present

## 2014-07-12 DIAGNOSIS — I480 Paroxysmal atrial fibrillation: Secondary | ICD-10-CM | POA: Diagnosis not present

## 2014-07-13 ENCOUNTER — Encounter: Payer: Self-pay | Admitting: Family Medicine

## 2014-11-27 ENCOUNTER — Other Ambulatory Visit: Payer: Self-pay | Admitting: Family Medicine

## 2014-11-29 ENCOUNTER — Encounter: Payer: Self-pay | Admitting: Adult Health

## 2014-11-29 ENCOUNTER — Ambulatory Visit (INDEPENDENT_AMBULATORY_CARE_PROVIDER_SITE_OTHER): Payer: Commercial Managed Care - HMO | Admitting: Adult Health

## 2014-11-29 VITALS — BP 140/80 | HR 57 | Temp 98.3°F | Ht 65.0 in | Wt 187.8 lb

## 2014-11-29 DIAGNOSIS — I480 Paroxysmal atrial fibrillation: Secondary | ICD-10-CM | POA: Diagnosis not present

## 2014-11-29 DIAGNOSIS — R5383 Other fatigue: Secondary | ICD-10-CM | POA: Diagnosis not present

## 2014-11-29 DIAGNOSIS — E7800 Pure hypercholesterolemia, unspecified: Secondary | ICD-10-CM

## 2014-11-29 DIAGNOSIS — E611 Iron deficiency: Secondary | ICD-10-CM | POA: Diagnosis not present

## 2014-11-29 LAB — CBC WITH DIFFERENTIAL/PLATELET
Basophils Absolute: 0 10*3/uL (ref 0.0–0.1)
Basophils Relative: 0.6 % (ref 0.0–3.0)
EOS PCT: 4.9 % (ref 0.0–5.0)
Eosinophils Absolute: 0.3 10*3/uL (ref 0.0–0.7)
HCT: 42.9 % (ref 36.0–46.0)
Hemoglobin: 14.2 g/dL (ref 12.0–15.0)
LYMPHS ABS: 1.6 10*3/uL (ref 0.7–4.0)
Lymphocytes Relative: 26.6 % (ref 12.0–46.0)
MCHC: 33.1 g/dL (ref 30.0–36.0)
MCV: 83.5 fl (ref 78.0–100.0)
Monocytes Absolute: 0.5 10*3/uL (ref 0.1–1.0)
Monocytes Relative: 8 % (ref 3.0–12.0)
NEUTROS ABS: 3.5 10*3/uL (ref 1.4–7.7)
NEUTROS PCT: 59.9 % (ref 43.0–77.0)
PLATELETS: 222 10*3/uL (ref 150.0–400.0)
RBC: 5.14 Mil/uL — ABNORMAL HIGH (ref 3.87–5.11)
RDW: 13.5 % (ref 11.5–15.5)
WBC: 5.9 10*3/uL (ref 4.0–10.5)

## 2014-11-29 LAB — TSH: TSH: 1.19 u[IU]/mL (ref 0.35–4.50)

## 2014-11-29 LAB — LIPID PANEL
CHOL/HDL RATIO: 6
Cholesterol: 278 mg/dL — ABNORMAL HIGH (ref 0–200)
HDL: 44.7 mg/dL (ref >39.00)
NONHDL: 232.81
Triglycerides: 209 mg/dL — ABNORMAL HIGH (ref 0.0–149.0)
VLDL: 41.8 mg/dL — AB (ref 0.0–40.0)

## 2014-11-29 LAB — IRON AND TIBC
%SAT: 23 % (ref 11–50)
IRON: 74 ug/dL (ref 45–160)
TIBC: 322 ug/dL (ref 250–450)
UIBC: 248 ug/dL (ref 125–400)

## 2014-11-29 LAB — LDL CHOLESTEROL, DIRECT: LDL DIRECT: 230 mg/dL

## 2014-11-29 LAB — POCT CBG MONITORING: GLUCOSE, FINGERSTICK: 100

## 2014-11-29 NOTE — Patient Instructions (Signed)
I will follow up with you regarding your blood work.   I will send Dr. Einar Gip a note about your Eliquis.

## 2014-11-29 NOTE — Progress Notes (Signed)
Pre visit review using our clinic review tool, if applicable. No additional management support is needed unless otherwise documented below in the visit note. 

## 2014-11-29 NOTE — Progress Notes (Addendum)
Subjective:    Patient ID: [name], female    DOB: November 18, 1931, 81 y.o.   MRN: [id number]  HPI  81 year old female who presents to the office today for an acute complaint. About one week ago she had cut on her right leg that would not stop bleeding. She is concerned that due to the bleeding that her iron levels may be low due to the fact " I just don't feel good." When asked to elaborate on her symptoms she states " I just don't feel well, I don't have the energy anymore, I do not know how to explain it."  She has stopped taking Eliquis about 3 weeks ago. On a note dated from 07/15/2014, her cardiologist informed the patient that he recommended that she not stop taking her anticoagulant as she had a high risk for a cardioembolic event. And that she would need long term anticoagulation.   She has also stopped taking her Lipitor " a long time ago, it may have been many months or close to a year since I have taken it." Again, cardiology warned the patient of her risks of stopping this medication as well.    Review of Systems  Constitutional: Positive for fatigue. Negative for fever and activity change.  Respiratory: Negative.   Cardiovascular: Negative.   Neurological: Negative.   Hematological: Bruises/bleeds easily.   Past Medical History  Diagnosis Date  . Diabetes mellitus   . Hypertension   . Fibrocystic breast disease   . Meningioma   . Tendon tear, ankle   . SEBACEOUS CYST, NECK 02/08/2007    Qualifier: Diagnosis of  By: Arnoldo Morale MD, John E   . Cellulitis and abscess of neck 03/18/2007    Qualifier: Diagnosis of  By: Arnoldo Morale MD, Balinda Quails   . Allergic rhinitis due to other allergen 06/14/2008    Qualifier: Diagnosis of  By: Arnoldo Morale MD, Balinda Quails     Social History   Social History  . Marital Status: Widowed    Spouse Name: N/A  . Number of Children: N/A  . Years of Education: N/A   Occupational History  . Not on file.   Social History Main Topics  . Smoking status:  Never Smoker   . Smokeless tobacco: Not on file  . Alcohol Use: No  . Drug Use: No  . Sexual Activity: No   Other Topics Concern  . Not on file   Social History Narrative    Past Surgical History  Procedure Laterality Date  . Breast lumpectomy    . Tonsillectomy    . Tendon tear    . Colonoscopy      05/25/2000. Results: Normal. Hemorrhoids  . Cardioversion N/A 04/18/2013    Procedure: CARDIOVERSION-BEDSIDE;  Surgeon: Laverda Page, MD;  Location: [place];  Service: Cardiovascular;  Laterality: N/A;  . Left heart catheterization with coronary angiogram N/A 04/17/2013    Procedure: LEFT HEART CATHETERIZATION WITH CORONARY ANGIOGRAM;  Surgeon: Lorretta Harp, MD;  [facility] C[facility] LAB;  Service: Cardiovascular;  Laterality: N/A;  . Percutaneous coronary stent intervention (pci-s) N/A 04/18/2013    Procedure: PERCUTANEOUS CORONARY STENT INTERVENTION (PCI-S);  Surgeon: Laverda Page, MD;  Location: [facility] CATH LAB;  Service: Cardiovascular;  Laterality: N/A;  . Percutaneous coronary stent intervention (pci-s) N/A 04/19/2013    Procedure: PERCUTANEOUS CORONARY STENT INTERVENTION (PCI-S);  Surgeon: Laverda Page, MD;  Location: [facility] CATH LAB;  Service: Cardiovascular;  Laterality: N/A;    Family History  Problem Relation Age of Onset  . Cancer Mother   . Heart disease Father   . Stroke Father     Allergies  Allergen Reactions  . Sulfonamide Derivatives Other (See Comments)    REACTION: childhood unknown type, ended up in hospital    Current Outpatient Prescriptions on File Prior to Visit  Medication Sig Dispense Refill  . Ascorbic Acid (VITAMIN C) 100 MG tablet Take 100 mg by mouth daily.    Marland Kitchen aspirin 81 MG tablet Take 81 mg by mouth daily.      Marland Kitchen atenolol (TENORMIN) 100 MG tablet TAKE 1 TABLET BY MOUTH ONCE DAILY 30 tablet 5  . calcium carbonate (TUMS - DOSED IN MG ELEMENTAL CALCIUM) 500 MG chewable tablet Chew 1 tablet by mouth daily as needed for indigestion or  heartburn.    . nitroGLYCERIN (NITROSTAT) 0.4 MG SL tablet Place 1 tablet (0.4 mg total) under the tongue every 5 (five) minutes x 3 doses as needed for chest pain. (Patient not taking: Reported on 05/10/2014) 30 tablet 2  . Omega-3 Fatty Acids (FISH OIL) 1000 MG CAPS Take 1 capsule by mouth daily.     No current facility-administered medications on file prior to visit.    BP 140/80 mmHg  Pulse 57  Temp(Src) 98.3 F (36.8 C) (Oral)  Ht 5\' 5"  (1.651 m)  Wt 187 lb 12.8 oz (85.186 kg)  BMI 31.25 kg/m2  SpO2 95%   Past Medical History  Diagnosis Date  . Diabetes mellitus   . Hypertension   . Fibrocystic breast disease   . Meningioma   . Tendon tear, ankle   . SEBACEOUS CYST, NECK 02/08/2007    Qualifier: Diagnosis of  By: Arnoldo Morale MD, John E   . Cellulitis and abscess of neck 03/18/2007    Qualifier: Diagnosis of  By: Arnoldo Morale MD, Balinda Quails   . Allergic rhinitis due to other allergen 06/14/2008    Qualifier: Diagnosis of  By: Arnoldo Morale MD, Balinda Quails     Social History   Social History  . Marital Status: Widowed    Spouse Name: N/A  . Number of Children: N/A  . Years of Education: N/A   Occupational History  . Not on file.   Social History Main Topics  . Smoking status: Never Smoker   . Smokeless tobacco: Not on file  . Alcohol Use: No  . Drug Use: No  . Sexual Activity: No   Other Topics Concern  . Not on file   Social History Narrative    Past Surgical History  Procedure Laterality Date  . Breast lumpectomy    . Tonsillectomy    . Tendon tear    . Colonoscopy      05/25/2000. Results: Normal. Hemorrhoids  . Cardioversion N/A 04/18/2013    Procedure: CARDIOVERSION-BEDSIDE;  Surgeon: Laverda Page, MD;  Location: [place];  Service: Cardiovascular;  Laterality: N/A;  . Left heart catheterization with coronary angiogram N/A 04/17/2013    Procedure: LEFT HEART CATHETERIZATION WITH CORONARY ANGIOGRAM;  Surgeon: Lorretta Harp, MD;  Location: [facility] CATH LAB;  Service:  Cardiovascular;  Laterality: N/A;  . Percutaneous coronary stent intervention (pci-s) N/A 04/18/2013    Procedure: PERCUTANEOUS CORONARY STENT INTERVENTION (PCI-S);  Surgeon: Laverda Page, MD;  Location: [facility] CATH LAB;  Service: Cardiovascular;  Laterality: N/A;  . Percutaneous coronary stent intervention (pci-s) N/A 04/19/2013    Procedure: PERCUTANEOUS CORONARY STENT INTERVENTION (PCI-S);  Surgeon: Laverda Page, MD;  Location: [facility] CATH LAB;  Service: Cardiovascular;  Laterality: N/A;    Family History  Problem Relation Age of Onset  . Cancer Mother   . Heart disease Father   . Stroke Father     Allergies  Allergen Reactions  . Sulfonamide Derivatives Other (See Comments)    REACTION: childhood unknown type, ended up in hospital    Current Outpatient Prescriptions on File Prior to Visit  Medication Sig Dispense Refill  . Ascorbic Acid (VITAMIN C) 100 MG tablet Take 100 mg by mouth daily.    Marland Kitchen aspirin 81 MG tablet Take 81 mg by mouth daily.      Marland Kitchen atenolol (TENORMIN) 100 MG tablet TAKE 1 TABLET BY MOUTH ONCE DAILY 30 tablet 5  . calcium carbonate (TUMS - DOSED IN MG ELEMENTAL CALCIUM) 500 MG chewable tablet Chew 1 tablet by mouth daily as needed for indigestion or heartburn.    . nitroGLYCERIN (NITROSTAT) 0.4 MG SL tablet Place 1 tablet (0.4 mg total) under the tongue every 5 (five) minutes x 3 doses as needed for chest pain. (Patient not taking: Reported on 05/10/2014) 30 tablet 2  . Omega-3 Fatty Acids (FISH OIL) 1000 MG CAPS Take 1 capsule by mouth daily.     No current facility-administered medications on file prior to visit.    BP 140/80 mmHg  Pulse 57  Temp(Src) 98.3 F (36.8 C) (Oral)  Ht 5\' 5"  (1.651 m)  Wt 187 lb 12.8 oz (85.186 kg)  BMI 31.25 kg/m2  SpO2 95%       Objective:   Physical Exam  Constitutional: She is oriented to person, place, and time. She appears well-developed and well-nourished. No distress.  Neck: Normal range of motion. Neck supple.    Cardiovascular: Normal rate, regular rhythm, normal heart sounds and intact distal pulses.  Exam reveals no gallop and no friction rub.   No murmur heard. Pulmonary/Chest: Effort normal and breath sounds normal. No respiratory distress. She has no wheezes. She has no rales. She exhibits no tenderness.  Musculoskeletal: Normal range of motion.  Lymphadenopathy:    She has no cervical adenopathy.  Neurological: She is alert and oriented to person, place, and time.  Skin: Skin is warm and dry. No rash noted. She is not diaphoretic. No erythema. No pallor.  Small, well healing wound on right lower leg. No signs of infection.  Psychiatric: She has a normal mood and affect. Her behavior is normal. Judgment and thought content normal.  Vitals reviewed.     Assessment & Plan:  1. Other fatigue - Unknown origin. Her complaints very vague. Will check for anemia or thyroid issues.  - TSH - CBC with Differential/Platelet - Iron and TIBC  2. Hypercholesteremia - Advised to restart Lipitor - Lipid panel  3. Paroxysmal atrial fibrillation - NSR in office today.  - Patient does not want to restart Eliquis. She is aware of the complications, including: heart attack, stroke, and death.

## 2014-11-30 ENCOUNTER — Emergency Department (HOSPITAL_COMMUNITY): Payer: Commercial Managed Care - HMO

## 2014-11-30 ENCOUNTER — Emergency Department (HOSPITAL_COMMUNITY)
Admission: EM | Admit: 2014-11-30 | Discharge: 2014-11-30 | Discharge status: Against Medical Advice | Payer: Commercial Managed Care - HMO | Attending: Emergency Medicine | Admitting: Emergency Medicine

## 2014-11-30 ENCOUNTER — Encounter (HOSPITAL_COMMUNITY): Payer: Self-pay | Admitting: *Deleted

## 2014-11-30 DIAGNOSIS — E119 Type 2 diabetes mellitus without complications: Secondary | ICD-10-CM | POA: Insufficient documentation

## 2014-11-30 DIAGNOSIS — R079 Chest pain, unspecified: Secondary | ICD-10-CM | POA: Insufficient documentation

## 2014-11-30 DIAGNOSIS — I10 Essential (primary) hypertension: Secondary | ICD-10-CM | POA: Insufficient documentation

## 2014-11-30 LAB — BASIC METABOLIC PANEL
ANION GAP: 8 (ref 5–15)
BUN: 7 mg/dL (ref 6–20)
CO2: 25 mmol/L (ref 22–32)
Calcium: 9.8 mg/dL (ref 8.9–10.3)
Chloride: 106 mmol/L (ref 101–111)
Creatinine, Ser: 0.71 mg/dL (ref 0.44–1.00)
GLUCOSE: 119 mg/dL — AB (ref 65–99)
POTASSIUM: 4.1 mmol/L (ref 3.5–5.1)
Sodium: 139 mmol/L (ref 135–145)

## 2014-11-30 LAB — CBC
HEMATOCRIT: 42.7 % (ref 36.0–46.0)
HEMOGLOBIN: 14.2 g/dL (ref 12.0–15.0)
MCH: 27.8 pg (ref 26.0–34.0)
MCHC: 33.3 g/dL (ref 30.0–36.0)
MCV: 83.7 fL (ref 78.0–100.0)
Platelets: 203 10*3/uL (ref 150–400)
RBC: 5.1 MIL/uL (ref 3.87–5.11)
RDW: 13 % (ref 11.5–15.5)
WBC: 6 10*3/uL (ref 4.0–10.5)

## 2014-11-30 LAB — TROPONIN I

## 2014-11-30 NOTE — ED Notes (Signed)
The pt is c/o mid-chest pain since earlier today.  No pain at present no sob dizziness or nausea.

## 2014-11-30 NOTE — ED Notes (Signed)
Patient states cannot wait any longer has to pick some one else up and no one is able to help her. Patient states she also does not want to wait. Pt denies CP at present time, has been informed of risks and benefits. Pt. Informed that she is welcome to come back at anytime.

## 2015-01-01 DIAGNOSIS — I480 Paroxysmal atrial fibrillation: Secondary | ICD-10-CM | POA: Diagnosis not present

## 2015-01-01 DIAGNOSIS — Z6831 Body mass index (BMI) 31.0-31.9, adult: Secondary | ICD-10-CM | POA: Diagnosis not present

## 2015-01-01 DIAGNOSIS — I25119 Atherosclerotic heart disease of native coronary artery with unspecified angina pectoris: Secondary | ICD-10-CM | POA: Diagnosis not present

## 2015-01-01 DIAGNOSIS — Z5309 Procedure and treatment not carried out because of other contraindication: Secondary | ICD-10-CM | POA: Diagnosis not present

## 2015-01-04 DIAGNOSIS — E782 Mixed hyperlipidemia: Secondary | ICD-10-CM | POA: Diagnosis not present

## 2015-01-04 DIAGNOSIS — I25119 Atherosclerotic heart disease of native coronary artery with unspecified angina pectoris: Secondary | ICD-10-CM | POA: Diagnosis not present

## 2015-01-04 LAB — HEPATIC FUNCTION PANEL
ALK PHOS: 87 U/L (ref 25–125)
AST: 16 U/L (ref 13–35)
Bilirubin, Total: 2.2 mg/dL

## 2015-01-04 LAB — BASIC METABOLIC PANEL
BUN: 10 mg/dL (ref 4–21)
CREATININE: 0.8 mg/dL (ref 0.5–1.1)
Glucose: 97 mg/dL
Potassium: 4.5 mmol/L (ref 3.4–5.3)
Sodium: 141 mmol/L (ref 137–147)

## 2015-01-04 LAB — LIPID PANEL
Cholesterol: 276 mg/dL — AB (ref 0–200)
HDL: 45 mg/dL (ref 35–70)
LDL Cholesterol: 192 mg/dL
TRIGLYCERIDES: 194 mg/dL — AB (ref 40–160)

## 2015-01-04 LAB — CBC AND DIFFERENTIAL
HEMATOCRIT: 41 % (ref 36–46)
Hemoglobin: 13.6 g/dL (ref 12.0–16.0)
WBC: 5.4 10^3/mL

## 2015-01-09 ENCOUNTER — Encounter: Payer: Self-pay | Admitting: Family Medicine

## 2015-03-06 ENCOUNTER — Other Ambulatory Visit: Payer: Self-pay

## 2015-03-06 MED ORDER — ATENOLOL 100 MG PO TABS: 100.0000 mg | ORAL_TABLET | Freq: Every day | ORAL | Status: DC

## 2015-04-05 DIAGNOSIS — L0292 Furuncle, unspecified: Secondary | ICD-10-CM | POA: Diagnosis not present

## 2015-04-09 ENCOUNTER — Encounter: Payer: Self-pay | Admitting: Family Medicine

## 2015-04-09 ENCOUNTER — Ambulatory Visit (INDEPENDENT_AMBULATORY_CARE_PROVIDER_SITE_OTHER): Payer: Commercial Managed Care - HMO | Admitting: Family Medicine

## 2015-04-09 VITALS — BP 142/80 | HR 74 | Temp 97.3°F | Ht 65.0 in | Wt 187.8 lb

## 2015-04-09 DIAGNOSIS — L0292 Furuncle, unspecified: Secondary | ICD-10-CM | POA: Diagnosis not present

## 2015-04-09 DIAGNOSIS — I10 Essential (primary) hypertension: Secondary | ICD-10-CM

## 2015-04-09 MED ORDER — DOXYCYCLINE HYCLATE 100 MG PO CAPS: 100.0000 mg | ORAL_CAPSULE | Freq: Two times a day (BID) | ORAL | Status: DC

## 2015-04-09 NOTE — Patient Instructions (Signed)
BEFORE YOU LEAVE: -recheck blood pressure - schedule follow up with PCP in 2-4 weeks if remains high  STOP the antibiotic you are currently taking  Call to schedule follow up with your gynecologist in 1-2 weeks to recheck and to set up your regular mammogram or diagnostic if persistent breast issues. Follow up sooner if worsening or not improving.  Start the doxycycline and take twice daily  Use warm compresses 2 times daily

## 2015-04-09 NOTE — Progress Notes (Addendum)
HPI:  [name] is a pleasant and inquisitive 82 yo F here 17 minutes late for an acute visit for:  Breast pain: -started 4 days ago -pain and redness in L breast/axillary region -reports saw provider at her gynecologist office Marylynn Pearson) whom does her breast exams and mammograms and was started on abx - she doesn't know what abx thinks it was "CEP something" -reports much better but still with some pain many questions and followed up here due to reported cheaper copay and had to see a nurse practitioner "not a real doctor" at her gyn office instead of her regular doctor  -denies: fevers, chills, malaise, drainage, weight loss, breast discharge   ROS: See pertinent positives and negatives per HPI.  Past Medical History  Diagnosis Date  . Diabetes mellitus   . Hypertension   . Fibrocystic breast disease   . Meningioma ([place])   . Tendon tear, ankle   . SEBACEOUS CYST, NECK 02/08/2007    Qualifier: Diagnosis of  By: Arnoldo Morale MD, John E   . Cellulitis and abscess of neck 03/18/2007    Qualifier: Diagnosis of  By: Arnoldo Morale MD, Balinda Quails   . Allergic rhinitis due to other allergen 06/14/2008    Qualifier: Diagnosis of  By: Arnoldo Morale MD, Balinda Quails     Past Surgical History  Procedure Laterality Date  . Breast lumpectomy    . Tonsillectomy    . Tendon tear    . Colonoscopy      05/25/2000. Results: Normal. Hemorrhoids  . Cardioversion N/A 04/18/2013    Procedure: CARDIOVERSION-BEDSIDE;  Surgeon: Laverda Page, MD;  Location: [place];  Service: Cardiovascular;  Laterality: N/A;  . Left heart catheterization with coronary angiogram N/A 04/17/2013    Procedure: LEFT HEART CATHETERIZATION WITH CORONARY ANGIOGRAM;  Surgeon: Lorretta Harp, MD;  Location: [facility] CATH LAB;  Service: Cardiovascular;  Laterality: N/A;  . Percutaneous coronary stent intervention (pci-s) N/A 04/18/2013    Procedure: PERCUTANEOUS CORONARY STENT INTERVENTION (PCI-S);  Surgeon: Laverda Page, MD;  Location: [facility]  CATH LAB;  Service: Cardiovascular;  Laterality: N/A;  . Percutaneous coronary stent intervention (pci-s) N/A 04/19/2013    Procedure: PERCUTANEOUS CORONARY STENT INTERVENTION (PCI-S);  Surgeon: Laverda Page, MD;  Location: [facility] CATH LAB;  Service: Cardiovascular;  Laterality: N/A;    Family History  Problem Relation Age of Onset  . Cancer Mother   . Heart disease Father   . Stroke Father     Social History   Social History  . Marital Status: Widowed    Spouse Name: N/A  . Number of Children: N/A  . Years of Education: N/A   Social History Main Topics  . Smoking status: Never Smoker   . Smokeless tobacco: None  . Alcohol Use: No  . Drug Use: No  . Sexual Activity: No   Other Topics Concern  . None   Social History Narrative     Current outpatient prescriptions:  .  Ascorbic Acid (VITAMIN C) 100 MG tablet, Take 100 mg by mouth daily., Disp: , Rfl:  .  aspirin 81 MG tablet, Take 81 mg by mouth daily.  , Disp: , Rfl:  .  atenolol (TENORMIN) 100 MG tablet, Take 1 tablet (100 mg total) by mouth daily., Disp: 90 tablet, Rfl: 2 .  calcium carbonate (TUMS - DOSED IN MG ELEMENTAL CALCIUM) 500 MG chewable tablet, Chew 1 tablet by mouth daily as needed for indigestion or heartburn., Disp: , Rfl:  .  doxycycline (  VIBRAMYCIN) 100 MG capsule, Take 1 capsule (100 mg total) by mouth 2 (two) times daily., Disp: 14 capsule, Rfl: 0 .  nitroGLYCERIN (NITROSTAT) 0.4 MG SL tablet, Place 1 tablet (0.4 mg total) under the tongue every 5 (five) minutes x 3 doses as needed for chest pain. (Patient not taking: Reported on 05/10/2014), Disp: 30 tablet, Rfl: 2 .  Omega-3 Fatty Acids (FISH OIL) 1000 MG CAPS, Take 1 capsule by mouth daily., Disp: , Rfl:   EXAM:  Filed Vitals:   04/09/15 1523 04/09/15 1552  BP: 150/80 142/80  Pulse: 74   Temp: 97.3 F (36.3 C)     Body mass index is 31.25 kg/(m^2).  GENERAL: vitals reviewed and listed above, alert, oriented, appears well hydrated and in no  acute distress  HEENT: atraumatic, conjunttiva clear, no obvious abnormalities on inspection of external nose and ears  NECK: no obvious masses on inspection  BREAST: small erythematous papule with small pustular head around small hair on L lateral breast/approching axillary line, mild surrounding erythema approx 1- 1.5 cm in diameter without induration or fluctuant mass or other palpable abnormalities of the L breast  MS: moves all extremities without noticeable abnormality  PSYCH: pleasant and cooperative, no obvious depression or anxiety  ASSESSMENT AND PLAN:  Discussed the following assessment and plan:  Boil  Essential hypertension  -she had many questions and did not know she had a boil/furuncle - had not noticed it and thought this was an allergy? Answered questions. -tx with doxy and compresses to cover staph organisms  -follow up with gyn whom she sees for her breast health in 1-2 weeks as is due for routine mammo and may need diagnostic if persistent skin issues -mildly elevated BP on arrival -->recheck BP prior to leaving after sitting and schedule follow up with PCP if remains elevated -Patient advised to return or notify a doctor immediately if symptoms worsen or persist or new concerns arise.  Patient Instructions  BEFORE YOU LEAVE: -recheck blood pressure - schedule follow up with PCP in 2-4 weeks if remains high  STOP the antibiotic you are currently taking  Call to schedule follow up with your gynecologist in 1-2 weeks to recheck and to set up your regular mammogram or diagnostic if persistent breast issues. Follow up sooner if worsening or not improving.  Start the doxycycline and take twice daily  Use warm compresses 2 times daily        Berlyn Malina R.

## 2015-04-09 NOTE — Progress Notes (Signed)
Pre visit review using our clinic review tool, if applicable. No additional management support is needed unless otherwise documented below in the visit note. 

## 2015-04-16 ENCOUNTER — Other Ambulatory Visit: Payer: Self-pay | Admitting: Obstetrics and Gynecology

## 2015-04-16 DIAGNOSIS — N644 Mastodynia: Secondary | ICD-10-CM | POA: Diagnosis not present

## 2015-04-16 DIAGNOSIS — R2232 Localized swelling, mass and lump, left upper limb: Secondary | ICD-10-CM | POA: Diagnosis not present

## 2015-04-16 DIAGNOSIS — Z6832 Body mass index (BMI) 32.0-32.9, adult: Secondary | ICD-10-CM | POA: Diagnosis not present

## 2015-04-16 DIAGNOSIS — Z124 Encounter for screening for malignant neoplasm of cervix: Secondary | ICD-10-CM | POA: Diagnosis not present

## 2015-04-16 DIAGNOSIS — N63 Unspecified lump in breast: Secondary | ICD-10-CM | POA: Diagnosis not present

## 2015-04-16 DIAGNOSIS — R5383 Other fatigue: Secondary | ICD-10-CM | POA: Diagnosis not present

## 2015-04-16 DIAGNOSIS — E559 Vitamin D deficiency, unspecified: Secondary | ICD-10-CM | POA: Diagnosis not present

## 2015-04-17 ENCOUNTER — Ambulatory Visit
Admission: RE | Admit: 2015-04-17 | Discharge: 2015-04-17 | Disposition: A | Discharge status: Home / Self Care | Payer: Commercial Managed Care - HMO | Source: Ambulatory Visit | Attending: Obstetrics and Gynecology | Admitting: Obstetrics and Gynecology

## 2015-04-17 ENCOUNTER — Other Ambulatory Visit: Payer: Self-pay | Admitting: Obstetrics and Gynecology

## 2015-04-17 DIAGNOSIS — N644 Mastodynia: Secondary | ICD-10-CM

## 2015-04-17 DIAGNOSIS — N6002 Solitary cyst of left breast: Secondary | ICD-10-CM | POA: Diagnosis not present

## 2015-05-07 DIAGNOSIS — R29890 Loss of height: Secondary | ICD-10-CM | POA: Diagnosis not present

## 2015-05-07 DIAGNOSIS — N958 Other specified menopausal and perimenopausal disorders: Secondary | ICD-10-CM | POA: Diagnosis not present

## 2015-06-07 ENCOUNTER — Ambulatory Visit (INDEPENDENT_AMBULATORY_CARE_PROVIDER_SITE_OTHER): Payer: Commercial Managed Care - HMO | Admitting: Family Medicine

## 2015-06-07 ENCOUNTER — Encounter: Payer: Self-pay | Admitting: Family Medicine

## 2015-06-07 VITALS — BP 140/76 | HR 66 | Temp 98.1°F | Wt 186.0 lb

## 2015-06-07 DIAGNOSIS — R079 Chest pain, unspecified: Secondary | ICD-10-CM

## 2015-06-07 NOTE — Progress Notes (Signed)
Garret Reddish, MD  Subjective:  [name] is a 82 y.o. year old very pleasant female patient who presents for/with See problem oriented charting ROS- see ros below  Past Medical History-  Patient Active Problem List   Diagnosis Date Noted  . Coronary atherosclerosis of native coronary artery 04/18/2013    Priority: High  . Paroxysmal atrial fibrillation ([place]) 04/18/2013    Priority: High  . NSTEMI (non-ST elevated myocardial infarction) ([place]) 04/16/2013    Priority: High  . Memory loss 05/10/2014    Priority: Medium  . Mixed hyperlipidemia 04/23/2013    Priority: Medium  . Essential hypertension 09/21/2006    Priority: Medium  . Hyperglycemia 01/21/2010    Priority: Low  . Osteopenia 06/19/2009    Priority: Low  . NECK PAIN 04/30/2008    Priority: Low  . MENINGIOMA 09/21/2006    Priority: Low  . FIBROCYSTIC BREAST DISEASE 09/21/2006    Priority: Low    Medications- reviewed and updated Current Outpatient Prescriptions  Medication Sig Dispense Refill  . Ascorbic Acid (VITAMIN C) 100 MG tablet Take 100 mg by mouth daily.    Marland Kitchen aspirin 81 MG tablet Take 81 mg by mouth daily.      Marland Kitchen atenolol (TENORMIN) 100 MG tablet Take 1 tablet (100 mg total) by mouth daily. 90 tablet 2  . calcium carbonate (TUMS - DOSED IN MG ELEMENTAL CALCIUM) 500 MG chewable tablet Chew 1 tablet by mouth daily as needed for indigestion or heartburn.    . Omega-3 Fatty Acids (FISH OIL) 1000 MG CAPS Take 1 capsule by mouth daily.    Marland Kitchen doxycycline (VIBRAMYCIN) 100 MG capsule Take 1 capsule (100 mg total) by mouth 2 (two) times daily. (Patient not taking: Reported on 06/07/2015) 14 capsule 0  . lisinopril (PRINIVIL,ZESTRIL) 5 MG tablet Take 5 mg by mouth daily. Per Dr. Einar Gip    . nitroGLYCERIN (NITROSTAT) 0.4 MG SL tablet Place 1 tablet (0.4 mg total) under the tongue every 5 (five) minutes x 3 doses as needed for chest pain. (Patient not taking: Reported on 05/10/2014) 30 tablet 2   No current  facility-administered medications for this visit.    Objective: BP 140/76 mmHg  Pulse 66  Temp(Src) 98.1 F (36.7 C)  Wt 186 lb (84.369 kg)  SpO2 97% Gen: NAD, resting comfortably CV: RRR no murmurs rubs or gallops No chest wall pain Lungs: CTAB no crackles, wheeze, rhonchi Abdomen: soft/nontender/nondistended/normal bowel sounds. No rebound or guarding.  Ext: no edema Skin: warm, dry Neuro: grossly normal, moves all extremities  EKG: sinus rhyhtm with rate 63, normal axis, normal intervals, no hypertrophy, inverted t waves in II, III, avf, v4-v6 unchanged from previous.   Assessment/Plan:  Atypical chest pain S: Driving to see sister today and had a "horrible pain" in her chest. Moderate intensity. Had not just eaten. Pain was around noon. No active chest pain now. Sharp pains. Lasted 1 minute, think she burped and it got better. Followed by Dr. Einar Gip in November and was told come back in a year. Has not had any pain in recent memory like this. Did not feel like her heart attack  Went to urgent care today but didn't have EKG so decided to come here.   ROS- mild diarrhea in last 1-2 days. No shortness of breath. No left arm or neck pain. No diaphoresis. No nausea. No exertional component- just sitting in car, was not under stress.   A/P: 89F high risk for cardiac disease with known  CAD with history of NSTEMI, also a fib history presenting with sharp chest pain of 1.5 minutes duration resolving with burping. EKG is unchanged from previous and fortunately she is in sinus rhythm- this was likely gas related chest pain. She is aware that EKG cannot rule out cardiac cause and that there is a possibility this could be heart related but strong proability it is not given time and course of events.  We discussed strict and emergent return precautions. If has recurrent episodes discussed following up with dr. Einar Gip.   BP is high today- discussed follow up within a month. Had been started on  lisinopril by Dr. Einar Gip and we would consider titrating up to 10mg  or more at that time.  Within a month follow up.    Orders Placed This Encounter  Procedures  . EKG 12-Lead    Meds ordered this encounter  Medications  . lisinopril (PRINIVIL,ZESTRIL) 5 MG tablet    Sig: Take 5 mg by mouth daily. Per Dr. Einar Gip    The duration of face-to-face time during this visit was 25 minutes. Greater than 50% of this time was spent in counseling, explanation of diagnosis, planning of further management, and/or coordination of care.

## 2015-06-07 NOTE — Patient Instructions (Addendum)
EKG is reassuring. This cannot rule out officially that it was not your heart but given your symptoms are much more consistent with gas (that then improved when you burped), no further follow up needed.   I would advise you seek care immediately by calling 911 if recurrent symptoms that last more than a minute or if you have recurrent episodes of similar pain to seek care with Dr. Einar Gip.   I also advise you to see me back in a month for your regularly scheduled follow up. I would recheck blood pressure at that time as mildly elevated

## 2015-07-17 DIAGNOSIS — E782 Mixed hyperlipidemia: Secondary | ICD-10-CM | POA: Diagnosis not present

## 2015-07-17 DIAGNOSIS — I25119 Atherosclerotic heart disease of native coronary artery with unspecified angina pectoris: Secondary | ICD-10-CM | POA: Diagnosis not present

## 2015-07-17 DIAGNOSIS — I480 Paroxysmal atrial fibrillation: Secondary | ICD-10-CM | POA: Diagnosis not present

## 2015-07-17 DIAGNOSIS — Z5309 Procedure and treatment not carried out because of other contraindication: Secondary | ICD-10-CM | POA: Diagnosis not present

## 2015-09-04 ENCOUNTER — Encounter: Payer: Self-pay | Admitting: Family Medicine

## 2015-09-04 ENCOUNTER — Ambulatory Visit (INDEPENDENT_AMBULATORY_CARE_PROVIDER_SITE_OTHER): Payer: Commercial Managed Care - HMO | Admitting: Family Medicine

## 2015-09-04 VITALS — BP 138/74 | HR 64 | Temp 97.6°F | Ht 65.0 in | Wt 184.0 lb

## 2015-09-04 DIAGNOSIS — I480 Paroxysmal atrial fibrillation: Secondary | ICD-10-CM | POA: Diagnosis not present

## 2015-09-04 DIAGNOSIS — E782 Mixed hyperlipidemia: Secondary | ICD-10-CM | POA: Diagnosis not present

## 2015-09-04 DIAGNOSIS — R413 Other amnesia: Secondary | ICD-10-CM

## 2015-09-04 DIAGNOSIS — I2510 Atherosclerotic heart disease of native coronary artery without angina pectoris: Secondary | ICD-10-CM

## 2015-09-04 DIAGNOSIS — I10 Essential (primary) hypertension: Secondary | ICD-10-CM

## 2015-09-04 LAB — COMPREHENSIVE METABOLIC PANEL
ALT: 13 U/L (ref 0–35)
AST: 15 U/L (ref 0–37)
Albumin: 4.1 g/dL (ref 3.5–5.2)
Alkaline Phosphatase: 88 U/L (ref 39–117)
BUN: 12 mg/dL (ref 6–23)
CHLORIDE: 103 meq/L (ref 96–112)
CO2: 34 meq/L — AB (ref 19–32)
Calcium: 9.6 mg/dL (ref 8.4–10.5)
Creatinine, Ser: 0.79 mg/dL (ref 0.40–1.20)
GFR: 74.12 mL/min (ref >60.00)
GLUCOSE: 113 mg/dL — AB (ref 70–99)
POTASSIUM: 4.2 meq/L (ref 3.5–5.1)
SODIUM: 141 meq/L (ref 135–145)
Total Bilirubin: 0.7 mg/dL (ref 0.2–1.2)
Total Protein: 6.4 g/dL (ref 6.0–8.3)

## 2015-09-04 LAB — VITAMIN B12: Vitamin B-12: 390 pg/mL (ref 211–911)

## 2015-09-04 LAB — CBC
HEMATOCRIT: 41.6 % (ref 36.0–46.0)
Hemoglobin: 13.9 g/dL (ref 12.0–15.0)
MCHC: 33.3 g/dL (ref 30.0–36.0)
MCV: 81.3 fl (ref 78.0–100.0)
Platelets: 230 10*3/uL (ref 150.0–400.0)
RBC: 5.12 Mil/uL — ABNORMAL HIGH (ref 3.87–5.11)
RDW: 14 % (ref 11.5–15.5)
WBC: 6.3 10*3/uL (ref 4.0–10.5)

## 2015-09-04 LAB — LDL CHOLESTEROL, DIRECT: Direct LDL: 201 mg/dL

## 2015-09-04 LAB — TSH: TSH: 1.2 u[IU]/mL (ref 0.35–4.50)

## 2015-09-04 MED ORDER — ATORVASTATIN CALCIUM 80 MG PO TABS: 80.0000 mg | ORAL_TABLET | Freq: Every day | ORAL | Status: DC

## 2015-09-04 MED ORDER — APIXABAN 5 MG PO TABS: 5.0000 mg | ORAL_TABLET | Freq: Two times a day (BID) | ORAL | Status: DC

## 2015-09-04 NOTE — Progress Notes (Signed)
Subjective:  [name] is a 82 y.o. year old very pleasant female patient who presents for/with See problem oriented charting ROS- no chest pain or shortness of breath, admits to some fatigue if stands in same position but improves with activity.see any ROS included in HPI as well.   Past Medical History-  Patient Active Problem List   Diagnosis Date Noted  . Coronary atherosclerosis of native coronary artery 04/18/2013    Priority: High  . Paroxysmal atrial fibrillation ([place]) 04/18/2013    Priority: High  . NSTEMI (non-ST elevated myocardial infarction) ([place]) 04/16/2013    Priority: High  . Memory loss 05/10/2014    Priority: Medium  . Mixed hyperlipidemia 04/23/2013    Priority: Medium  . Essential hypertension 09/21/2006    Priority: Medium  . Hyperglycemia 01/21/2010    Priority: Low  . Osteopenia 06/19/2009    Priority: Low  . NECK PAIN 04/30/2008    Priority: Low  . MENINGIOMA 09/21/2006    Priority: Low  . FIBROCYSTIC BREAST DISEASE 09/21/2006    Priority: Low    Medications- reviewed and updated Current Outpatient Prescriptions  Medication Sig Dispense Refill  . Ascorbic Acid (VITAMIN C) 100 MG tablet Take 100 mg by mouth daily.    Marland Kitchen aspirin 81 MG tablet Take 81 mg by mouth daily.      Marland Kitchen atenolol (TENORMIN) 100 MG tablet Take 1 tablet (100 mg total) by mouth daily. 90 tablet 2  . calcium carbonate (TUMS - DOSED IN MG ELEMENTAL CALCIUM) 500 MG chewable tablet Chew 1 tablet by mouth daily as needed for indigestion or heartburn.    Marland Kitchen lisinopril (PRINIVIL,ZESTRIL) 5 MG tablet Take 5 mg by mouth daily. Per Dr. Einar Gip    . nitroGLYCERIN (NITROSTAT) 0.4 MG SL tablet Place 1 tablet (0.4 mg total) under the tongue every 5 (five) minutes x 3 doses as needed for chest pain. 30 tablet 2  . Omega-3 Fatty Acids (FISH OIL) 1000 MG CAPS Take 1 capsule by mouth daily.     Objective: BP 138/74 mmHg  Pulse 64  Temp(Src) 97.6 F (36.4 C) (Oral)  Ht 5\' 5"  (1.651 m)  Wt 184 lb  (83.462 kg)  BMI 30.62 kg/m2  SpO2 95% Gen: NAD, resting comfortably CV: RRR no murmurs rubs or gallops Lungs: CTAB no crackles, wheeze, rhonchi Abdomen: soft/nontender/nondistended/normal bowel sounds. obese Ext: no edema Skin: warm, dry Neuro: grossly normal, moves all extremities Can be forgetful during conversation  Assessment/Plan:  Paroxysmal atrial fibrillation S: rate controlled on atenolol. Also on eliquis for anticoagulation in past but stopped taking previously per Dr. Einar Gip because she didn't want to take it. Has to be on aspirin due to CAD per Dr. Einar Gip.  A/P: Today she agrees to restart eliquis 5mg  BID, I have refilled this. Continue atenolol  Coronary atherosclerosis of native coronary artery S: asymptomatic with no chest pain or SOB since CP episode months ago. Compliant with aspirin.  atorvastatin 80mg  prescribed and a year ago 123 but 8 months ago 192- patient is not taking atorvastatin for unclear reason. Feels some fatigue if standing in one position, gets better if she walks around- given symptoms improve with activity instead of worsenign strongly doubt cardiac A/P: continue aspirin. Advised and refilled atorvastatin 80mg    Essential hypertension S: controlled on lisinopril 5mg , atenolol 100mg  BP Readings from Last 3 Encounters:  09/04/15 138/74  06/07/15 140/76  04/09/15 142/80  A/P:Continue current meds:  Wants to stop BP meds for unclear reason- with CAD  history and just under 140, advised to continue  Memory loss S: Last year family remported memory issues and patient scored 30/30 on MMSE. This year patient is alone but admits to memory issues and scores 25/30 (loses 3 for three word recall, 1 for 3 step command, 1 for orientation). Does not appear saw ENT for hearing loss A/P: will get basic labs for reversible causes of memory loss but my suspicion is either alzheimers or vascular dementia potentially. Will refer to neurology for further workup.  Hopefully  family can come to next visit.     3 months advised to check in on compliance- possibly update labs   Orders Placed This Encounter  Procedures  . CBC    [place]  . Comprehensive metabolic panel    [place]  . LDL cholesterol, direct    [place]  . TSH    [place]  . HIV antibody  . RPR    [facility]  . Vitamin B12  . Ambulatory referral to Neurology    Referral Priority:  Routine    Referral Type:  Consultation    Referral Reason:  Specialty Services Required    Requested Specialty:  Neurology    Number of Visits Requested:  1    Meds ordered this encounter  Medications  . atorvastatin (LIPITOR) 80 MG tablet    Sig: Take 1 tablet (80 mg total) by mouth daily.    Dispense:  90 tablet    Refill:  3  . apixaban (ELIQUIS) 5 MG TABS tablet    Sig: Take 1 tablet (5 mg total) by mouth 2 (two) times daily.    Dispense:  60 tablet    Refill:  5    Return precautions advised.  Garret Reddish, MD

## 2015-09-04 NOTE — Patient Instructions (Signed)
Your memory has worsened- let's have you see neurology We will call you within a week about your referral. If you do not hear within 2 weeks, give [place] a call. Tested 30/30 last visit and 25/30 today  Stop by lab before you leave  Continue blood pressure medicines  Restart eliquis 5mg  twice a day  Restart atorvastatin  See me back in 3 months to see how you are doing and check in on your compliance

## 2015-09-04 NOTE — Progress Notes (Signed)
Pre visit review using our clinic review tool, if applicable. No additional management support is needed unless otherwise documented below in the visit note. 

## 2015-09-05 LAB — RPR

## 2015-09-05 LAB — HIV ANTIBODY (ROUTINE TESTING W REFLEX): HIV: NONREACTIVE

## 2015-09-05 NOTE — Assessment & Plan Note (Signed)
S: controlled on lisinopril 5mg , atenolol 100mg  BP Readings from Last 3 Encounters:  09/04/15 138/74  06/07/15 140/76  04/09/15 142/80  A/P:Continue current meds:  Wants to stop BP meds for unclear reason- with CAD history and just under 140, advised to continue

## 2015-09-05 NOTE — Assessment & Plan Note (Signed)
S: rate controlled on atenolol. Also on eliquis for anticoagulation in past but stopped taking previously per Dr. Einar Gip because she didn't want to take it. Has to be on aspirin due to CAD per Dr. Einar Gip.  A/P: Today she agrees to restart eliquis 5mg  BID, I have refilled this. Continue atenolol

## 2015-09-05 NOTE — Assessment & Plan Note (Signed)
S: asymptomatic with no chest pain or SOB since CP episode months ago. Compliant with aspirin.  atorvastatin 80mg  prescribed and a year ago 123 but 8 months ago 192- patient is not taking atorvastatin for unclear reason. Feels some fatigue if standing in one position, gets better if she walks around- given symptoms improve with activity instead of worsenign strongly doubt cardiac A/P: continue aspirin. Advised and refilled atorvastatin 80mg 

## 2015-09-05 NOTE — Assessment & Plan Note (Addendum)
S: Last year family remported memory issues and patient scored 30/30 on MMSE. This year patient is alone but admits to memory issues and scores 25/30 (loses 3 for three word recall, 1 for 3 step command, 1 for orientation). Does not appear saw ENT for hearing loss A/P: will get basic labs for reversible causes of memory loss but my suspicion is either alzheimers or vascular dementia potentially. Will refer to neurology for further workup.  Hopefully family can come to next visit.

## 2015-09-11 ENCOUNTER — Telehealth: Payer: Self-pay | Admitting: Family Medicine

## 2015-09-11 DIAGNOSIS — I480 Paroxysmal atrial fibrillation: Secondary | ICD-10-CM | POA: Diagnosis not present

## 2015-09-11 DIAGNOSIS — I25119 Atherosclerotic heart disease of native coronary artery with unspecified angina pectoris: Secondary | ICD-10-CM | POA: Diagnosis not present

## 2015-09-11 DIAGNOSIS — Z5309 Procedure and treatment not carried out because of other contraindication: Secondary | ICD-10-CM | POA: Diagnosis not present

## 2015-09-11 DIAGNOSIS — Z6831 Body mass index (BMI) 31.0-31.9, adult: Secondary | ICD-10-CM | POA: Diagnosis not present

## 2015-09-11 NOTE — Telephone Encounter (Signed)
I tried to call pt and line was busy, I will call back in just a few minutes.

## 2015-09-11 NOTE — Telephone Encounter (Signed)
Patient needs the ED or she needs appointment with her cardiologist. Given her history of A.Fib and recent start on blood thinners, we need to ensure she gets seen

## 2015-09-11 NOTE — Telephone Encounter (Signed)
I have tried to call pt again and no answer, only machine comes on, not sure what else can be done.

## 2015-09-11 NOTE — Telephone Encounter (Signed)
I left a voice message for pt to return my call, just checking on pt to find out if she indeed did get a call back from cardiologist.

## 2015-09-11 NOTE — Telephone Encounter (Signed)
I spoke with pt and she has put a call in to her Cardiologist. She is awaiting a call now with advice.

## 2015-09-11 NOTE — Telephone Encounter (Signed)
Patient Name: [name] DOB: Jun 25, 1931 Initial Comment Caller states she was seen a couple of weeks ago. 2 days ago started having chest pain that comes and goes. Right now no sx, has question. Nurse Assessment Nurse: Vallery Sa, RN, [place] Date/Time (Eastern Time): 09/11/2015 10:55:41 AM Confirm and document reason for call. If symptomatic, describe symptoms. You must click the next button to save text entered. ---[name] states she has had chest pain on and off the past 2 two days. No chest pain or breathing difficulty. No injury in the past 3 days. No fever. Alert and responsive. Has the patient traveled out of the country within the last 30 days? ---No Does the patient have any new or worsening symptoms? ---Yes Will a triage be completed? ---Yes Related visit to physician within the last 2 weeks? ---No Does the PT have any chronic conditions? (i.e. diabetes, asthma, etc.) ---Yes List chronic conditions. ---High Blood Pressure, Heart attack a couple of years ago Is this a behavioral health or substance abuse call? ---No Guidelines Guideline Title Affirmed Question Affirmed Notes Chest Pain History of prior "blood clot" in leg or lungs (i.e., deep vein thrombosis, pulmonary embolism) Final Disposition User Go to ED Now Vallery Sa, RN, [place] Referrals [facility] - ED Disagree/Comply: Adell Brackley states that she thinks her prior heart attack was caused by a blood clot and that she takes blood thinners.

## 2015-09-12 NOTE — Telephone Encounter (Signed)
Spoke with patient who states she saw the Cardiologist yesterday and that they started her on Pepcid. She states they did say she has a heart murmur and they are going to do some follow up testing. She states she is no longer having the chest pain. Instructed her to call if she had any further questions or concerns. Verbalized understanding.

## 2015-09-12 NOTE — Telephone Encounter (Signed)
Great news that she was evaluated. Even better news likely not cardiac in origin. We are available as needed for patient

## 2015-09-24 DIAGNOSIS — R079 Chest pain, unspecified: Secondary | ICD-10-CM | POA: Diagnosis not present

## 2015-09-24 DIAGNOSIS — R011 Cardiac murmur, unspecified: Secondary | ICD-10-CM | POA: Diagnosis not present

## 2015-09-30 DIAGNOSIS — I25119 Atherosclerotic heart disease of native coronary artery with unspecified angina pectoris: Secondary | ICD-10-CM | POA: Diagnosis not present

## 2015-09-30 DIAGNOSIS — Z6831 Body mass index (BMI) 31.0-31.9, adult: Secondary | ICD-10-CM | POA: Diagnosis not present

## 2015-09-30 DIAGNOSIS — I10 Essential (primary) hypertension: Secondary | ICD-10-CM | POA: Diagnosis not present

## 2015-09-30 DIAGNOSIS — R011 Cardiac murmur, unspecified: Secondary | ICD-10-CM | POA: Diagnosis not present

## 2015-10-29 ENCOUNTER — Encounter: Payer: Self-pay | Admitting: Neurology

## 2015-10-29 ENCOUNTER — Other Ambulatory Visit (INDEPENDENT_AMBULATORY_CARE_PROVIDER_SITE_OTHER): Payer: Commercial Managed Care - HMO

## 2015-10-29 ENCOUNTER — Ambulatory Visit (INDEPENDENT_AMBULATORY_CARE_PROVIDER_SITE_OTHER): Payer: Commercial Managed Care - HMO | Admitting: Neurology

## 2015-10-29 VITALS — BP 140/70 | HR 58 | Ht 64.0 in | Wt 185.0 lb

## 2015-10-29 DIAGNOSIS — D329 Benign neoplasm of meninges, unspecified: Secondary | ICD-10-CM | POA: Diagnosis not present

## 2015-10-29 DIAGNOSIS — G3184 Mild cognitive impairment, so stated: Secondary | ICD-10-CM

## 2015-10-29 LAB — BUN: BUN: 10 mg/dL (ref 6–23)

## 2015-10-29 LAB — CREATININE, SERUM: CREATININE: 0.72 mg/dL (ref 0.40–1.20)

## 2015-10-29 MED ORDER — DONEPEZIL HCL 10 MG PO TABS: ORAL_TABLET | ORAL | 6 refills | Status: DC

## 2015-10-29 NOTE — Progress Notes (Signed)
NEUROLOGY CONSULTATION NOTE  [name] MRN: [id number] DOB: 12-30-1931  Referring provider: Dr. Garret Reddish Primary care provider: Dr. Garret Reddish  Reason for consult:  Memory loss  Dear Dr Yong Channel:  Thank you for your kind referral of [name] for consultation of the above symptoms. Although her history is well known to you, please allow me to reiterate it for the purpose of our medical record. The patient was accompanied to the clinic by her daughter who also provides collateral information. Records and images were personally reviewed where available.  HISTORY OF PRESENT ILLNESS: This is an 82 year old right-handed woman with a history of hypertension, diabetes, right posterior frontal meningioma, presenting for evaluation of worsening memory. She reports that she forgets things and it is worse.She lives alone. Memory issues became noticeable around 3 years ago, her daughter is now trying to fix credit card interest payments that have grown over the years. In spite of this, she still gives to charities all the time, her daughter states she does not understand that she gives away money she does not have. Bills were being paid late. Her granddaughter set up automatic draft payments last month. Over the past 6 months, her daughter has noticed she repeats herself in the same conversation. She has always had a good sense of direction, but had to ask where they were going on the way to her bank. She denies getting lost driving. She could not recall names of people she has known for a long time, such as cousins on her husband's side. Her daughter states her understanding is "way off," she asks a lot of questions about things which she would normally understand in the past. She denies missing her medications, but her daughter reminds her that she could not recall if she took her pill this morning. She has burned something on the stove twice recently when she got distracted by a phone  call. Personal hygiene is maintained, she cleans the dishes and does laundry without difficulty, but her house is filled with articles/newspapers in her living room, with only a small pathway to walk through. Her daughter denies any personality changes, but states her mother gets frustrated with herself.   She denies any headaches, dizziness, diplopia, dysarthria, dysphagia, neck/back pain, focal numbness/tingling/weakness, bowel/bladder dysfunction. No anosmia, tremors. She has some left knee pain after tripping on a rug this week. She denies any significant head injuries. No family history of dementia.  I personally reviewed MRI brain without contrast done 09/12/2010 which did not show any acute changes, there was mild age-related atrophy and chronic small vessel disease. There is a chronically known 76mm calcified meningioma along the right posterior frontal convexity without significant mass effect.  Laboratory Data: Lab Results  Component Value Date   WBC 6.3 09/04/2015   HGB 13.9 09/04/2015   HCT 41.6 09/04/2015   MCV 81.3 09/04/2015   PLT 230.0 09/04/2015     Chemistry      Component Value Date/Time   NA 141 09/04/2015 1134   NA 141 01/04/2015   K 4.2 09/04/2015 1134   CL 103 09/04/2015 1134   CO2 34 (H) 09/04/2015 1134   BUN 10 10/29/2015 1419   BUN 10 01/04/2015   CREATININE 0.72 10/29/2015 1419   CREATININE 0.80 05/09/2010 1724   GLU 97 01/04/2015      Component Value Date/Time   CALCIUM 9.6 09/04/2015 1134   ALKPHOS 88 09/04/2015 1134   AST 15 09/04/2015 1134  ALT 13 09/04/2015 1134   BILITOT 0.7 09/04/2015 1134     Lab Results  Component Value Date   TSH 1.20 09/04/2015   Lab Results  Component Value Date   VITAMINB12 390 09/04/2015     PAST MEDICAL HISTORY: Past Medical History:  Diagnosis Date  . Allergic rhinitis due to other allergen 06/14/2008   Qualifier: Diagnosis of  By: Arnoldo Morale MD, Balinda Quails   . Cellulitis and abscess of neck 03/18/2007    Qualifier: Diagnosis of  By: Arnoldo Morale MD, Balinda Quails   . Diabetes mellitus   . Fibrocystic breast disease   . Hypertension   . Meningioma ([place])   . SEBACEOUS CYST, NECK 02/08/2007   Qualifier: Diagnosis of  By: Arnoldo Morale MD, Balinda Quails   . Tendon tear, ankle     PAST SURGICAL HISTORY: Past Surgical History:  Procedure Laterality Date  . BREAST LUMPECTOMY    . CARDIOVERSION N/A 04/18/2013   Procedure: CARDIOVERSION-BEDSIDE;  Surgeon: Laverda Page, MD;  Location: [facility];  Service: Cardiovascular;  Laterality: N/A;  . COLONOSCOPY     05/25/2000. Results: Normal. Hemorrhoids  . LEFT HEART CATHETERIZATION WITH CORONARY ANGIOGRAM N/A 04/17/2013   Procedure: LEFT HEART CATHETERIZATION WITH CORONARY ANGIOGRAM;  Surgeon: Lorretta Harp, MD;  Location[facility] C[facility] LAB;  Service: Cardiovascular;  Laterality: N/A;  . PERCUTANEOUS CORONARY STENT INTERVENTION (PCI-S) N/A 04/18/2013   Procedure: PERCUTANEOUS CORONARY STENT INTERVENTION (PCI-S);  Surgeon: Laverda Page, MD;  Location: [facility] CATH LAB;  Service: Cardiovascular;  Laterality: N/A;  . PERCUTANEOUS CORONARY STENT INTERVENTION (PCI-S) N/A 04/19/2013   Procedure: PERCUTANEOUS CORONARY STENT INTERVENTION (PCI-S);  Surgeon: Laverda Page, MD;  Location: [facility] CATH LAB;  Service: Cardiovascular;  Laterality: N/A;  . tendon tear    . TONSILLECTOMY      MEDICATIONS: Current Outpatient Prescriptions on File Prior to Visit  Medication Sig Dispense Refill  . apixaban (ELIQUIS) 5 MG TABS tablet Take 1 tablet (5 mg total) by mouth 2 (two) times daily. 60 tablet 5  . Ascorbic Acid (VITAMIN C) 100 MG tablet Take 100 mg by mouth daily.    Marland Kitchen aspirin 81 MG tablet Take 81 mg by mouth daily.      Marland Kitchen atenolol (TENORMIN) 100 MG tablet Take 1 tablet (100 mg total) by mouth daily. 90 tablet 2  . atorvastatin (LIPITOR) 80 MG tablet Take 1 tablet (80 mg total) by mouth daily. 90 tablet 3  . calcium carbonate (TUMS - DOSED IN MG ELEMENTAL CALCIUM) 500 MG chewable tablet  Chew 1 tablet by mouth daily as needed for indigestion or heartburn.    Marland Kitchen lisinopril (PRINIVIL,ZESTRIL) 5 MG tablet Take 5 mg by mouth daily. Per Dr. Einar Gip    . nitroGLYCERIN (NITROSTAT) 0.4 MG SL tablet Place 1 tablet (0.4 mg total) under the tongue every 5 (five) minutes x 3 doses as needed for chest pain. 30 tablet 2  . Omega-3 Fatty Acids (FISH OIL) 1000 MG CAPS Take 1 capsule by mouth daily.     No current facility-administered medications on file prior to visit.     ALLERGIES: Allergies  Allergen Reactions  . Sulfonamide Derivatives Other (See Comments)    REACTION: childhood unknown type, ended up in hospital    FAMILY HISTORY: Family History  Problem Relation Age of Onset  . Cancer Mother   . Heart disease Father   . Stroke Father     SOCIAL HISTORY: Social History   Social History  . Marital status: Widowed  Spouse name: N/A  . Number of children: N/A  . Years of education: N/A   Occupational History  . Not on file.   Social History Main Topics  . Smoking status: Never Smoker  . Smokeless tobacco: Not on file  . Alcohol use No  . Drug use: No  . Sexual activity: No   Other Topics Concern  . Not on file   Social History Narrative  . No narrative on file    REVIEW OF SYSTEMS: Constitutional: No fevers, chills, or sweats, no generalized fatigue, change in appetite Eyes: No visual changes, double vision, eye pain Ear, nose and throat: No hearing loss, ear pain, nasal congestion, sore throat Cardiovascular: No chest pain, palpitations Respiratory:  No shortness of breath at rest or with exertion, wheezes GastrointestinaI: No nausea, vomiting, diarrhea, abdominal pain, fecal incontinence Genitourinary:  No dysuria, urinary retention or frequency Musculoskeletal:  No neck pain, back pain Integumentary: No rash, pruritus, skin lesions Neurological: as above Psychiatric: No depression, insomnia, anxiety Endocrine: No palpitations, fatigue, diaphoresis,  mood swings, change in appetite, change in weight, increased thirst Hematologic/Lymphatic:  No anemia, purpura, petechiae. Allergic/Immunologic: no itchy/runny eyes, nasal congestion, recent allergic reactions, rashes  PHYSICAL EXAM: Vitals:   10/29/15 1248  BP: 140/70  Pulse: (!) 58   General: No acute distress Head:  Normocephalic/atraumatic Eyes: Fundoscopic exam shows bilateral sharp discs, no vessel changes, exudates, or hemorrhages Neck: supple, no paraspinal tenderness, full range of motion Back: No paraspinal tenderness Heart: regular rate and rhythm Lungs: Clear to auscultation bilaterally. Vascular: No carotid bruits. Skin/Extremities: No rash, no edema Neurological Exam: Mental status: alert and oriented to person, place, and time, no dysarthria or aphasia, Fund of knowledge is appropriate.  Recent and remote memory are intact.  Attention and concentration are normal.    Able to name objects and repeat phrases.  Montreal Cognitive Assessment  10/29/2015  Visuospatial/ Executive (0/5) 4  Naming (0/3) 2  Attention: Read list of digits (0/2) 2  Attention: Read list of letters (0/1) 1  Attention: Serial 7 subtraction starting at 100 (0/3) 3  Language: Repeat phrase (0/2) 2  Language : Fluency (0/1) 0  Abstraction (0/2) 2  Delayed Recall (0/5) 2  Orientation (0/6) 6  Total 24  Adjusted Score (based on education) 24   Cranial nerves: CN I: not tested CN II: pupils equal, round and reactive to light, visual fields intact, fundi unremarkable. CN III, IV, VI:  full range of motion, no nystagmus, no ptosis CN V: facial sensation intact CN VII: upper and lower face symmetric CN VIII: hearing intact to finger rub CN IX, X: gag intact, uvula midline CN XI: sternocleidomastoid and trapezius muscles intact CN XII: tongue midline Bulk & Tone: normal, no fasciculations. Motor: 5/5 throughout with no pronator drift. Sensation: intact to light touch, cold, pin, vibration and  joint position sense.  No extinction to double simultaneous stimulation.  Romberg test negative Deep Tendon Reflexes: +1 throughout, no ankle clonus Plantar responses: downgoing bilaterally Cerebellar: no incoordination on finger to nose, heel to shin. No dysdiadochokinesia Gait: slow and cautious due to left knee pain, no ataxia, unable to tandem walk due to knee issues Tremor: none  IMPRESSION: This is an 82 year old right-handed woman with a history of hypertension, diabetes, right frontal meningioma, presenting for worsening memory loss. Her neurological exam is non-focal, MOCA score 24/30, indicating mild cognitive impairment, possible mild dementia by history. We discussed different causes of memory loss. TSH and B12 normal.  MRI brain with and without contrast will be ordered to assess for underlying structural abnormality and assess vascular load, as well as interval follow-up for meningioma, which if unchanged, would not be causing her symptoms. We discussed that she may benefit from starting cholinesterase inhibitors such as Aricept, side effects and expectations from the medication were discussed. She will start Aricept 5mg  daily for 1 month, then increase to 10mg  daily. We discussed the importance of control of vascular risk factors, physical exercise, and brain stimulation exercises for brain health. She will follow-up in 6 months and knows to call for any changes.   Thank you for allowing me to participate in the care of this patient. Please do not hesitate to call for any questions or concerns.   Ellouise Newer, M.D.  CC: Dr. Yong Channel

## 2015-10-29 NOTE — Patient Instructions (Addendum)
1. Schedule MRI brain with and without contrast 2. Start Aricept 10mg : Take 1/2 tablet daily for 1 month, then increase to 1 tablet daily 3. Control of blood pressure, cholesterol, as well as physical exercise and brain stimulation exercises are important for brain health. Look into the Mediterranean diet to help for memory loss. 4. Follow-up in 6 months, call for any changes

## 2015-10-31 DIAGNOSIS — D329 Benign neoplasm of meninges, unspecified: Secondary | ICD-10-CM | POA: Insufficient documentation

## 2015-10-31 DIAGNOSIS — G3184 Mild cognitive impairment, so stated: Secondary | ICD-10-CM | POA: Insufficient documentation

## 2015-11-16 ENCOUNTER — Ambulatory Visit
Admission: RE | Admit: 2015-11-16 | Discharge: 2015-11-16 | Disposition: A | Discharge status: Home / Self Care | Payer: Commercial Managed Care - HMO | Source: Ambulatory Visit | Attending: Neurology | Admitting: Neurology

## 2015-11-16 ENCOUNTER — Other Ambulatory Visit: Payer: Self-pay | Admitting: Neurology

## 2015-11-16 DIAGNOSIS — G3184 Mild cognitive impairment, so stated: Secondary | ICD-10-CM

## 2015-11-16 DIAGNOSIS — D329 Benign neoplasm of meninges, unspecified: Secondary | ICD-10-CM

## 2015-12-17 ENCOUNTER — Encounter: Payer: Self-pay | Admitting: Family Medicine

## 2015-12-17 ENCOUNTER — Ambulatory Visit (INDEPENDENT_AMBULATORY_CARE_PROVIDER_SITE_OTHER): Payer: Commercial Managed Care - HMO | Admitting: Family Medicine

## 2015-12-17 VITALS — BP 138/82 | HR 56 | Temp 97.4°F | Wt 185.6 lb

## 2015-12-17 DIAGNOSIS — R0602 Shortness of breath: Secondary | ICD-10-CM

## 2015-12-17 DIAGNOSIS — I480 Paroxysmal atrial fibrillation: Secondary | ICD-10-CM

## 2015-12-17 DIAGNOSIS — E782 Mixed hyperlipidemia: Secondary | ICD-10-CM | POA: Diagnosis not present

## 2015-12-17 DIAGNOSIS — I10 Essential (primary) hypertension: Secondary | ICD-10-CM

## 2015-12-17 DIAGNOSIS — I2510 Atherosclerotic heart disease of native coronary artery without angina pectoris: Secondary | ICD-10-CM | POA: Diagnosis not present

## 2015-12-17 LAB — COMPREHENSIVE METABOLIC PANEL
ALT: 12 U/L (ref 0–35)
AST: 12 U/L (ref 0–37)
Albumin: 4.3 g/dL (ref 3.5–5.2)
Alkaline Phosphatase: 90 U/L (ref 39–117)
BUN: 10 mg/dL (ref 6–23)
CALCIUM: 9.8 mg/dL (ref 8.4–10.5)
CO2: 28 meq/L (ref 19–32)
Chloride: 105 mEq/L (ref 96–112)
Creatinine, Ser: 0.7 mg/dL (ref 0.40–1.20)
GFR: 85.16 mL/min (ref >60.00)
GLUCOSE: 78 mg/dL (ref 70–99)
POTASSIUM: 4.3 meq/L (ref 3.5–5.1)
Sodium: 142 mEq/L (ref 135–145)
Total Bilirubin: 0.6 mg/dL (ref 0.2–1.2)
Total Protein: 6.6 g/dL (ref 6.0–8.3)

## 2015-12-17 LAB — CBC WITH DIFFERENTIAL/PLATELET
BASOS PCT: 0.6 % (ref 0.0–3.0)
Basophils Absolute: 0 10*3/uL (ref 0.0–0.1)
EOS PCT: 3.3 % (ref 0.0–5.0)
Eosinophils Absolute: 0.2 10*3/uL (ref 0.0–0.7)
HCT: 41.7 % (ref 36.0–46.0)
Hemoglobin: 14.1 g/dL (ref 12.0–15.0)
LYMPHS ABS: 1.6 10*3/uL (ref 0.7–4.0)
Lymphocytes Relative: 25.7 % (ref 12.0–46.0)
MCHC: 33.8 g/dL (ref 30.0–36.0)
MCV: 81.4 fl (ref 78.0–100.0)
MONOS PCT: 8.7 % (ref 3.0–12.0)
Monocytes Absolute: 0.5 10*3/uL (ref 0.1–1.0)
NEUTROS ABS: 3.8 10*3/uL (ref 1.4–7.7)
NEUTROS PCT: 61.7 % (ref 43.0–77.0)
PLATELETS: 263 10*3/uL (ref 150.0–400.0)
RBC: 5.13 Mil/uL — ABNORMAL HIGH (ref 3.87–5.11)
RDW: 13.5 % (ref 11.5–15.5)
WBC: 6.1 10*3/uL (ref 4.0–10.5)

## 2015-12-17 LAB — TSH: TSH: 1.31 u[IU]/mL (ref 0.35–4.50)

## 2015-12-17 LAB — LDL CHOLESTEROL, DIRECT: LDL DIRECT: 198 mg/dL

## 2015-12-17 MED ORDER — NITROGLYCERIN 0.4 MG SL SUBL: 0.4000 mg | SUBLINGUAL_TABLET | SUBLINGUAL | 2 refills | Status: DC | PRN

## 2015-12-17 NOTE — Assessment & Plan Note (Signed)
S: remains rate controlled on atenolol- mild bradycardia. On eliquis in past but patient desired stopping- she is aware of increased stroke risk- and agreed to restart eliquis last visit but states once a month perhaps and every few days takes aspirin A/P: encouraged daily aspirin at minimum and continue atenolol. Would prefer her on twice a day eliquis which she declines for now

## 2015-12-17 NOTE — Assessment & Plan Note (Signed)
S: controlled on lisinopril 5mg  and atenolol 100mg  BP Readings from Last 3 Encounters:  12/17/15 138/82  10/29/15 140/70  09/04/15 138/74  A/P:Continue current meds:  Doing well on repeat

## 2015-12-17 NOTE — Assessment & Plan Note (Signed)
S: poor compliance, poorly controlled suspected on atorvastatin 80mg  one a week. No myalgias.  Lab Results  Component Value Date   CHOL 276 (A) 01/04/2015   HDL 45 01/04/2015   LDLCALC 192 01/04/2015   LDLDIRECT 201.0 09/04/2015   TRIG 194 (A) 01/04/2015   CHOLHDL 6 11/29/2014   A/P: update direct ldl and thyroid (given fatigue as well)

## 2015-12-17 NOTE — Assessment & Plan Note (Signed)
S: no chest pain or SOB since before last visit. Does have some mild fatigue but this is not always an exertional thing. Not compliant with statin except once a month A/P: encouraged regular aspirin, statin, follow up with Dr. Einar Gip especially if labs normal and worsening fatigue

## 2015-12-17 NOTE — Progress Notes (Addendum)
Subjective:  [name] is a 82 y.o. year old very pleasant female patient who presents for/with See problem oriented charting ROS- No chest pain or shortness of breath. No headache or blurry vision.  Some midl fatigue.see any ROS included in HPI as well.   Past Medical History-  Patient Active Problem List   Diagnosis Date Noted  . Mild cognitive impairment 10/31/2015    Priority: High  . Coronary atherosclerosis of native coronary artery 04/18/2013    Priority: High  . Paroxysmal atrial fibrillation ([place]) 04/18/2013    Priority: High  . NSTEMI (non-ST elevated myocardial infarction) ([place]) 04/16/2013    Priority: High  . Meningioma ([place]) 10/31/2015    Priority: Medium  . Mixed hyperlipidemia 04/23/2013    Priority: Medium  . Essential hypertension 09/21/2006    Priority: Medium  . Hyperglycemia 01/21/2010    Priority: Low  . Osteopenia 06/19/2009    Priority: Low  . NECK PAIN 04/30/2008    Priority: Low  . MENINGIOMA 09/21/2006    Priority: Low  . FIBROCYSTIC BREAST DISEASE 09/21/2006    Priority: Low    Medications- reviewed and updated Current Outpatient Prescriptions  Medication Sig Dispense Refill  . Ascorbic Acid (VITAMIN C) 100 MG tablet Take 100 mg by mouth daily.    Marland Kitchen aspirin 81 MG tablet Take 81 mg by mouth daily.      Marland Kitchen atenolol (TENORMIN) 100 MG tablet Take 1 tablet (100 mg total) by mouth daily. 90 tablet 2  . calcium carbonate (TUMS - DOSED IN MG ELEMENTAL CALCIUM) 500 MG chewable tablet Chew 1 tablet by mouth daily as needed for indigestion or heartburn.    . donepezil (ARICEPT) 10 MG tablet Take 1/2 tablet daily for 1 month, then increase to 1 tablet daily 30 tablet 6  . lisinopril (PRINIVIL,ZESTRIL) 5 MG tablet Take 5 mg by mouth daily. Per Dr. Einar Gip    . nitroGLYCERIN (NITROSTAT) 0.4 MG SL tablet Place 1 tablet (0.4 mg total) under the tongue every 5 (five) minutes x 3 doses as needed for chest pain. 30 tablet 2  . Omega-3 Fatty Acids (FISH OIL) 1000  MG CAPS Take 1 capsule by mouth daily.    Marland Kitchen atorvastatin (LIPITOR) 80 MG tablet Take 1 tablet (80 mg total) by mouth daily. (Patient not taking: Reported on 12/17/2015) 90 tablet 3   No current facility-administered medications for this visit.     Objective: BP 138/82   Pulse (!) 56   Temp 97.4 F (36.3 C) (Oral)   Wt 185 lb 9.6 oz (84.2 kg)   LMP  (Exact Date)   SpO2 98%   BMI 31.86 kg/m  Gen: NAD, resting comfortably in chair CV: RRR no murmurs rubs or gallops Lungs: CTAB no crackles, wheeze, rhonchi Abdomen: soft/nontender/nondistended/normal bowel sounds. obese  Ext: no edema Skin: warm, dry Neuro: grossly normal, moves all extremities  Assessment/Plan:  Paroxysmal atrial fibrillation S: remains rate controlled on atenolol- mild bradycardia. On eliquis in past but patient desired stopping- she is aware of increased stroke risk- and agreed to restart eliquis last visit but states once a month perhaps and every few days takes aspirin A/P: encouraged daily aspirin at minimum and continue atenolol. Would prefer her on twice a day eliquis which she declines for now  Coronary atherosclerosis of native coronary artery S: no chest pain or SOB since before last visit. Does have some mild fatigue but this is not always an exertional thing. Not compliant with statin except once  a month A/P: encouraged regular aspirin, statin, follow up with Dr. Einar Gip especially if labs normal and worsening fatigue  Essential hypertension S: controlled on lisinopril 5mg  and atenolol 100mg  BP Readings from Last 3 Encounters:  12/17/15 138/82  10/29/15 140/70  09/04/15 138/74  A/P:Continue current meds:  Doing well on repeat   Mixed hyperlipidemia S: poor compliance, poorly controlled suspected on atorvastatin 80mg  one a week. No myalgias.  Lab Results  Component Value Date   CHOL 276 (A) 01/04/2015   HDL 45 01/04/2015   LDLCALC 192 01/04/2015   LDLDIRECT 201.0 09/04/2015   TRIG 194 (A)  01/04/2015   CHOLHDL 6 11/29/2014   A/P: update direct ldl and thyroid (given fatigue as well)    3 months  Orders Placed This Encounter  Procedures  . CBC with Differential/Platelet  . Comprehensive metabolic panel    [place]  . TSH    [place]  . LDL cholesterol, direct       Return precautions advised.  Garret Reddish, MD

## 2015-12-17 NOTE — Progress Notes (Signed)
Pre visit review using our clinic review tool, if applicable. No additional management support is needed unless otherwise documented below in the visit note. 

## 2015-12-17 NOTE — Patient Instructions (Addendum)
Labs before you leave to see if we can find any reason for fatigue  Sent in new nitroglycerin because appears old prescription would be old by now  I want you to take each medication on your med list on daily basis.   For aricept- try half tablet until see neurology back as long as no loose stools- may go back up to 10mg  if do well on 5mg 

## 2015-12-26 ENCOUNTER — Other Ambulatory Visit: Payer: Self-pay | Admitting: Family Medicine

## 2016-03-06 DIAGNOSIS — R0789 Other chest pain: Secondary | ICD-10-CM | POA: Diagnosis not present

## 2016-03-06 DIAGNOSIS — I25119 Atherosclerotic heart disease of native coronary artery with unspecified angina pectoris: Secondary | ICD-10-CM | POA: Diagnosis not present

## 2016-03-06 DIAGNOSIS — I10 Essential (primary) hypertension: Secondary | ICD-10-CM | POA: Diagnosis not present

## 2016-03-06 DIAGNOSIS — K210 Gastro-esophageal reflux disease with esophagitis: Secondary | ICD-10-CM | POA: Diagnosis not present

## 2016-04-27 DIAGNOSIS — K210 Gastro-esophageal reflux disease with esophagitis: Secondary | ICD-10-CM | POA: Diagnosis not present

## 2016-04-27 DIAGNOSIS — I25119 Atherosclerotic heart disease of native coronary artery with unspecified angina pectoris: Secondary | ICD-10-CM | POA: Diagnosis not present

## 2016-04-27 DIAGNOSIS — I10 Essential (primary) hypertension: Secondary | ICD-10-CM | POA: Diagnosis not present

## 2016-05-16 ENCOUNTER — Other Ambulatory Visit: Payer: Commercial Managed Care - HMO

## 2016-05-26 ENCOUNTER — Ambulatory Visit (INDEPENDENT_AMBULATORY_CARE_PROVIDER_SITE_OTHER): Payer: Medicare HMO | Admitting: Neurology

## 2016-05-26 ENCOUNTER — Encounter: Payer: Self-pay | Admitting: Neurology

## 2016-05-26 DIAGNOSIS — G3184 Mild cognitive impairment, so stated: Secondary | ICD-10-CM

## 2016-05-26 MED ORDER — DONEPEZIL HCL 10 MG PO TABS: ORAL_TABLET | ORAL | 6 refills | Status: DC

## 2016-05-26 NOTE — Patient Instructions (Signed)
1. Start Aricept 10mg : Take 1/2 tablet daily for 1 month, then increase to 1 tablet daily 2. Proceed with MRI brain as scheduled in April 3. Follow-up in 6 months (Please have Mariann Laster come on your next visit)

## 2016-05-26 NOTE — Progress Notes (Signed)
NEUROLOGY FOLLOW UP OFFICE NOTE  [name] [id number]  HISTORY OF PRESENT ILLNESS: I had the pleasure of seeing [name] in follow-up in the neurology clinic on 05/26/2016.  The patient was last seen 7 months ago for memory loss. She is alone in the office today. MOCA score 24/30 in August 2017, symptoms suggestive of mild dementia. She was given a prescription for Aricept, but she states she is not taking this and does not recall if she started it or not. She remembers that she takes Atenolol and Lisinopril, and that she took aspirin for back pain today. She has not had the MRI brain ordered in August, and has it scheduled next week, stating she decided to schedule it after she accompanied a friend for her test. She reports her daughter is working on the bills. Her children are helping her straighten out the house of it's clutter. She states she is pretty good with driving, except for one time she could not make a left turn and went all the way to [facility] she needed someone to write down directions home for her. She denies any headaches, dizziness, vision changes, no falls.   HPI 10/29/2015: This is an 83 yo RH woman with a history of hypertension, diabetes, right posterior frontal meningioma, with worsening memory. She reports that she forgets things and it is worse.She lives alone. Memory issues became noticeable around 3 years ago, her daughter is now trying to fix credit card interest payments that have grown over the years. In spite of this, she still gives to charities all the time, her daughter states she does not understand that she gives away money she does not have. Bills were being paid late. Her granddaughter set up automatic draft payments last month. Over the past 6 months, her daughter has noticed she repeats herself in the same conversation. She has always had a good sense of direction, but had to ask where they were going on the way to her bank. She denies getting lost  driving. She could not recall names of people she has known for a long time, such as cousins on her husband's side. Her daughter states her understanding is "way off," she asks a lot of questions about things which she would normally understand in the past. She denies missing her medications, but her daughter reminds her that she could not recall if she took her pill this morning. She has burned something on the stove twice recently when she got distracted by a phone call. Personal hygiene is maintained, she cleans the dishes and does laundry without difficulty, but her house is filled with articles/newspapers in her living room, with only a small pathway to walk through. Her daughter denies any personality changes, but states her mother gets frustrated with herself.   She denies any headaches, dizziness, diplopia, dysarthria, dysphagia, neck/back pain, focal numbness/tingling/weakness, bowel/bladder dysfunction. No anosmia, tremors. She has some left knee pain after tripping on a rug this week. She denies any significant head injuries. No family history of dementia.  I personally reviewed MRI brain without contrast done 09/12/2010 which did not show any acute changes, there was mild age-related atrophy and chronic small vessel disease. There is a chronically known 58mm calcified meningioma along the right posterior frontal convexity without significant mass effect. PAST MEDICAL HISTORY: Past Medical History:  Diagnosis Date  . Allergic rhinitis due to other allergen 06/14/2008   Qualifier: Diagnosis of  By: Arnoldo Morale MD, Balinda Quails   . Cellulitis  and abscess of neck 03/18/2007   Qualifier: Diagnosis of  By: Arnoldo Morale MD, Balinda Quails   . Diabetes mellitus   . Fibrocystic breast disease   . Hypertension   . Meningioma ([place])   . SEBACEOUS CYST, NECK 02/08/2007   Qualifier: Diagnosis of  By: Arnoldo Morale MD, Balinda Quails   . Tendon tear, ankle     MEDICATIONS: Current Outpatient Prescriptions on File Prior to Visit    Medication Sig Dispense Refill  . Ascorbic Acid (VITAMIN C) 100 MG tablet Take 100 mg by mouth daily.    Marland Kitchen aspirin 81 MG tablet Take 81 mg by mouth daily.      Marland Kitchen atenolol (TENORMIN) 100 MG tablet TAKE 1 TABLET BY MOUTH EVERY DAY 90 tablet 2  . atorvastatin (LIPITOR) 80 MG tablet Take 1 tablet (80 mg total) by mouth daily. (Patient not taking: Reported on 12/17/2015) 90 tablet 3  . calcium carbonate (TUMS - DOSED IN MG ELEMENTAL CALCIUM) 500 MG chewable tablet Chew 1 tablet by mouth daily as needed for indigestion or heartburn.    . donepezil (ARICEPT) 10 MG tablet Take 1/2 tablet daily for 1 month, then increase to 1 tablet daily 30 tablet 6  . lisinopril (PRINIVIL,ZESTRIL) 5 MG tablet Take 5 mg by mouth daily. Per Dr. Einar Gip    . nitroGLYCERIN (NITROSTAT) 0.4 MG SL tablet Place 1 tablet (0.4 mg total) under the tongue every 5 (five) minutes x 3 doses as needed for chest pain. 30 tablet 2  . Omega-3 Fatty Acids (FISH OIL) 1000 MG CAPS Take 1 capsule by mouth daily.     No current facility-administered medications on file prior to visit.     ALLERGIES: Allergies  Allergen Reactions  . Sulfonamide Derivatives Other (See Comments)    REACTION: childhood unknown type, ended up in hospital    FAMILY HISTORY: Family History  Problem Relation Age of Onset  . Stroke Mother   . Heart disease Father     SOCIAL HISTORY: Social History   Social History  . Marital status: Widowed    Spouse name: N/A  . Number of children: N/A  . Years of education: N/A   Occupational History  . Not on file.   Social History Main Topics  . Smoking status: Never Smoker  . Smokeless tobacco: Never Used  . Alcohol use No  . Drug use: No  . Sexual activity: No   Other Topics Concern  . Not on file   Social History Narrative  . No narrative on file    REVIEW OF SYSTEMS: Constitutional: No fevers, chills, or sweats, no generalized fatigue, change in appetite Eyes: No visual changes, double  vision, eye pain Ear, nose and throat: No hearing loss, ear pain, nasal congestion, sore throat Cardiovascular: No chest pain, palpitations Respiratory:  No shortness of breath at rest or with exertion, wheezes GastrointestinaI: No nausea, vomiting, diarrhea, abdominal pain, fecal incontinence Genitourinary:  No dysuria, urinary retention or frequency Musculoskeletal:  No neck pain, +back pain Integumentary: No rash, pruritus, skin lesions Neurological: as above Psychiatric: No depression, insomnia, anxiety Endocrine: No palpitations, fatigue, diaphoresis, mood swings, change in appetite, change in weight, increased thirst Hematologic/Lymphatic:  No anemia, purpura, petechiae. Allergic/Immunologic: no itchy/runny eyes, nasal congestion, recent allergic reactions, rashes  PHYSICAL EXAM: Vitals:   05/26/16 1612  BP: 140/76  Resp: 16  Temp: 98 F (36.7 C)   General: No acute distress Head:  Normocephalic/atraumatic Neck: supple, no paraspinal tenderness, full range of motion Heart:  Regular rate and rhythm Lungs:  Clear to auscultation bilaterally Back: No paraspinal tenderness Skin/Extremities: No rash, no edema Neurological Exam: alert and oriented to person, place, and time. No aphasia or dysarthria. Fund of knowledge is appropriate.  Recent and remote memory are intact.  Attention and concentration are normal.    Able to name objects and repeat phrases. CDT 4/5  MMSE - Mini Mental State Exam 05/26/2016  Orientation to time 5  Orientation to Place 5  Registration 3  Attention/ Calculation 5  Recall 2  Language- name 2 objects 2  Language- repeat 1  Language- follow 3 step command 3  Language- read & follow direction 1  Write a sentence 1  Copy design 1  Total score 29     Cranial nerves: Pupils equal, round, reactive to light. Extraocular movements intact with no nystagmus. Visual fields full. Facial sensation intact. No facial asymmetry. Tongue, uvula, palate midline.   Motor: Bulk and tone normal, muscle strength 5/5 throughout with no pronator drift.  Sensation to light touch intact.  No extinction to double simultaneous stimulation.  Deep tendon reflexes 2+ throughout, toes downgoing.  Finger to nose testing intact.  Gait narrow-based and steady, able to tandem walk adequately.  Romberg negative.  IMPRESSION: This is an 83 yo RH woman with a history of hypertension, diabetes, right frontal meningioma, with worsening memory loss. Her neurological exam is non-focal, MMSE today 29/30 (MOCA score 24/30 in August 2017). Symptoms suggestive of mild cognitive impairment, possible mild dementia. She is alone in the office today and has not yet done MRI but has scheduled it for next week. She also states she has not been taking the Aricept. She will start taking Aricept 5mg  daily for 1 month, then increase to 10mg  daily, side effects were again discussed. We again discussed the importance of control of vascular risk factors, physical exercise, and brain stimulation exercises for brain health. She will follow-up in 6 months and was asked to make sure her daughter comes with her on the next visit. She knows to call for any changes  Thank you for allowing me to participate in her care.  Please do not hesitate to call for any questions or concerns.  The duration of this appointment visit was 25 minutes of face-to-face time with the patient.  Greater than 50% of this time was spent in counseling, explanation of diagnosis, planning of further management, and coordination of care.   Ellouise Newer, M.D.   CC: Dr. Yong Channel

## 2016-05-27 ENCOUNTER — Encounter: Payer: Self-pay | Admitting: Neurology

## 2016-05-28 ENCOUNTER — Telehealth: Payer: Self-pay | Admitting: Neurology

## 2016-05-28 NOTE — Telephone Encounter (Signed)
[name] was seen this week and she would like a letter regarding what she should weigh and what she weighs now. She is in a weight loss class and they need this information. Thanks

## 2016-06-01 NOTE — Telephone Encounter (Addendum)
Clld [employer]/[phone]-spoke to Lola/CSR...auth# [id number] - 06/01/16 - 07/01/16. [facility] - gave auth info to Clarise Cruz.   ----- Message from Vassie Moselle sent at 06/01/2016 10:55 AM EDT ----- Per Dorian Pod at [facility] [phone]  Mcarthur Rossetti has to have prior Auth done for MRI  They will need it by 10 am tomorrow or they will cancel the MRI that is sch for 06-02-16. She states that you can call [phone] opt 0 to get the approval done

## 2016-06-02 ENCOUNTER — Ambulatory Visit
Admission: RE | Admit: 2016-06-02 | Discharge: 2016-06-02 | Disposition: A | Discharge status: Home / Self Care | Payer: Medicare HMO | Source: Ambulatory Visit | Attending: Neurology | Admitting: Neurology

## 2016-06-02 DIAGNOSIS — D329 Benign neoplasm of meninges, unspecified: Secondary | ICD-10-CM | POA: Diagnosis not present

## 2016-06-02 DIAGNOSIS — G3184 Mild cognitive impairment, so stated: Secondary | ICD-10-CM

## 2016-06-02 MED ORDER — GADOBENATE DIMEGLUMINE 529 MG/ML IV SOLN
17.0000 mL | Freq: Once | INTRAVENOUS | Status: AC | PRN
  Administered 2016-06-02: 17 mL via INTRAVENOUS

## 2016-06-11 ENCOUNTER — Ambulatory Visit (INDEPENDENT_AMBULATORY_CARE_PROVIDER_SITE_OTHER): Payer: Medicare HMO | Admitting: Family Medicine

## 2016-06-11 ENCOUNTER — Encounter: Payer: Self-pay | Admitting: Family Medicine

## 2016-06-11 VITALS — BP 132/70 | HR 62 | Temp 97.6°F | Ht 64.0 in | Wt 186.0 lb

## 2016-06-11 DIAGNOSIS — H9193 Unspecified hearing loss, bilateral: Secondary | ICD-10-CM | POA: Diagnosis not present

## 2016-06-11 DIAGNOSIS — L989 Disorder of the skin and subcutaneous tissue, unspecified: Secondary | ICD-10-CM

## 2016-06-11 NOTE — Patient Instructions (Signed)
Happy to refer you to dermatology if you want  We can also do a shave biopsy here if you prefer  Just let us know

## 2016-06-11 NOTE — Progress Notes (Signed)
Pre visit review using our clinic review tool, if applicable. No additional management support is needed unless otherwise documented below in the visit note. 

## 2016-06-11 NOTE — Progress Notes (Signed)
Subjective:  [name] is a 83 y.o. year old very pleasant female patient who presents for/with See problem oriented charting ROS- no expanding redness near lesion, no pain or itching. Has noted some growth of lesion. feels like ears full at times and does not hear as well as she used to   Past Medical History-  Patient Active Problem List   Diagnosis Date Noted  . Mild cognitive impairment 10/31/2015    Priority: High  . Coronary atherosclerosis of native coronary artery 04/18/2013    Priority: High  . Paroxysmal atrial fibrillation ([place]) 04/18/2013    Priority: High  . NSTEMI (non-ST elevated myocardial infarction) ([place]) 04/16/2013    Priority: High  . Meningioma ([place]) 10/31/2015    Priority: Medium  . Mixed hyperlipidemia 04/23/2013    Priority: Medium  . Essential hypertension 09/21/2006    Priority: Medium  . Hyperglycemia 01/21/2010    Priority: Low  . Osteopenia 06/19/2009    Priority: Low  . NECK PAIN 04/30/2008    Priority: Low  . MENINGIOMA 09/21/2006    Priority: Low  . FIBROCYSTIC BREAST DISEASE 09/21/2006    Priority: Low    Medications- reviewed and updated Current Outpatient Prescriptions  Medication Sig Dispense Refill  . Ascorbic Acid (VITAMIN C) 100 MG tablet Take 100 mg by mouth daily.    Marland Kitchen aspirin 81 MG tablet Take 81 mg by mouth daily.      Marland Kitchen atenolol (TENORMIN) 100 MG tablet TAKE 1 TABLET BY MOUTH EVERY DAY 90 tablet 2  . atorvastatin (LIPITOR) 80 MG tablet Take 1 tablet (80 mg total) by mouth daily. 90 tablet 3  . calcium carbonate (TUMS - DOSED IN MG ELEMENTAL CALCIUM) 500 MG chewable tablet Chew 1 tablet by mouth daily as needed for indigestion or heartburn.    . donepezil (ARICEPT) 10 MG tablet Take 1/2 tablet daily for 1 month, then increase to 1 tablet daily 30 tablet 6  . lisinopril (PRINIVIL,ZESTRIL) 5 MG tablet Take 5 mg by mouth daily. Per Dr. Einar Gip    . nitroGLYCERIN (NITROSTAT) 0.4 MG SL tablet Place 1 tablet (0.4 mg total) under the  tongue every 5 (five) minutes x 3 doses as needed for chest pain. 30 tablet 2  . Omega-3 Fatty Acids (FISH OIL) 1000 MG CAPS Take 1 capsule by mouth daily.     No current facility-administered medications for this visit.     Objective: BP 132/70 (BP Location: Left Arm, Patient Position: Sitting, Cuff Size: Large)   Pulse 62   Temp 97.6 F (36.4 C) (Oral)   Ht 5\' 4"  (1.626 m)   Wt 186 lb (84.4 kg)   SpO2 95%   BMI 31.93 kg/m  Gen: NAD, resting comfortably TM normal bilateral CV: RRR no murmurs rubs or gallops Lungs: CTAB no crackles, wheeze, rhonchi Ext: no edema Skin: warm, dry On right forehead near hairline there is a 3-4 mm lesion at its base with likely 5 mm extension from skin with firm/horned appearance   Assessment/Plan:  Skin lesion  Bilateral hearing loss, unspecified hearing loss type S: worried about lesion on right side of head near hairline. Does not hurt or itch.  Patient also had a wound on left leg at visit in October which we discussed seeing derm for if not improved A/P: I advised shave biopsy- whether here or dermatology. Lesion has a horned like appearance and would want to rule out malignancy. She wants to "talk to her family" to help her make  a decision- appears leaning toward dermatology as on her face. Would be procedure only visit if does here in office  Hearing loss S: complains of hearing loss- asks about ear wax A/P: no wax found- advised audilogy visit which she declines for now  Return precautions advised.  Garret Reddish, MD

## 2016-07-16 ENCOUNTER — Telehealth: Payer: Self-pay | Admitting: Neurology

## 2016-07-16 NOTE — Telephone Encounter (Signed)
Pt came into the office wanting her results of the MRI.  It was lunch time and I told her I would have someone call her.

## 2016-07-16 NOTE — Telephone Encounter (Signed)
Pls let her know the MRI brain did not show any new changes, no evidence of new stroke or bleed. There is evidence of an old small stroke that she does not have symptoms from. The meningioma has not changed in size. Has she started the Aricept? Thanks

## 2016-07-17 NOTE — Telephone Encounter (Signed)
Called pt to relay message below.  No answer.  No answering machine/Voicemail

## 2016-07-22 ENCOUNTER — Telehealth: Payer: Self-pay

## 2016-07-22 NOTE — Telephone Encounter (Signed)
Pt returned my returned my earlier call.  I relayed resent MRI results to her.

## 2016-08-04 ENCOUNTER — Telehealth: Payer: Self-pay | Admitting: Family Medicine

## 2016-08-04 NOTE — Telephone Encounter (Signed)
The patient is in weight lose class and they need to know what her weight is supposed to be. She also wants to know if she needs to make an appointment to see why she is so weak or if she needs to see her cardiologist. Please advise patient

## 2016-08-04 NOTE — Telephone Encounter (Signed)
She has been as low as 170 within the last 5 years. I think this is a reasonable goal within the next year.  In regards to her weakness I am happy to evaluate her. I'm sure she can also be seen by cardiology as well.

## 2016-08-10 NOTE — Telephone Encounter (Signed)
Called patient but was unable to leave a voicemail message as no voicemail. I will try agagin

## 2016-08-11 NOTE — Telephone Encounter (Signed)
Pt called back and decided to make an appt for Thurs, 6/14 for her issues.

## 2016-08-13 ENCOUNTER — Ambulatory Visit (INDEPENDENT_AMBULATORY_CARE_PROVIDER_SITE_OTHER): Payer: Medicare HMO | Admitting: Family Medicine

## 2016-08-13 ENCOUNTER — Encounter: Payer: Self-pay | Admitting: Family Medicine

## 2016-08-13 DIAGNOSIS — E782 Mixed hyperlipidemia: Secondary | ICD-10-CM

## 2016-08-13 DIAGNOSIS — I2510 Atherosclerotic heart disease of native coronary artery without angina pectoris: Secondary | ICD-10-CM

## 2016-08-13 DIAGNOSIS — I10 Essential (primary) hypertension: Secondary | ICD-10-CM | POA: Diagnosis not present

## 2016-08-13 DIAGNOSIS — G3184 Mild cognitive impairment, so stated: Secondary | ICD-10-CM | POA: Diagnosis not present

## 2016-08-13 NOTE — Patient Instructions (Addendum)
No changes today other than getting back to taking all medications regularly including the statin and the atenolol  Restart silver sneakers regularly  See me back in 1 month

## 2016-08-13 NOTE — Assessment & Plan Note (Signed)
S: controlled on atenolol 100mg  and lisinopril 5mg  on last check but patient has been taking atenolol every other day- I encouraged daily use and recheck in 1 month ASCVD 10 year risk calculation if age 81-79: has been prescribed statin but adherence issues BP Readings from Last 3 Encounters:  08/13/16 (!) 152/74  06/11/16 132/70  05/26/16 140/76  A/P: We discussed blood pressure goal of <140/90 with heart history. Continue current meds- but start atenolol back at daily

## 2016-08-13 NOTE — Progress Notes (Signed)
Subjective:  [name] is a 83 y.o. year old very pleasant female patient who presents for/with See problem oriented charting ROS- no fever or chills. No chest pain or worsening shortness of breath. Denies palpitations   Past Medical History-  Patient Active Problem List   Diagnosis Date Noted  . Mild cognitive impairment 10/31/2015    Priority: High  . Coronary atherosclerosis of native coronary artery 04/18/2013    Priority: High  . Paroxysmal atrial fibrillation ([place]) 04/18/2013    Priority: High  . NSTEMI (non-ST elevated myocardial infarction) ([place]) 04/16/2013    Priority: High  . Meningioma ([place]) 10/31/2015    Priority: Medium  . Mixed hyperlipidemia 04/23/2013    Priority: Medium  . Essential hypertension 09/21/2006    Priority: Medium  . Hyperglycemia 01/21/2010    Priority: Low  . Osteopenia 06/19/2009    Priority: Low  . NECK PAIN 04/30/2008    Priority: Low  . MENINGIOMA 09/21/2006    Priority: Low  . FIBROCYSTIC BREAST DISEASE 09/21/2006    Priority: Low    Medications- reviewed and updated Current Outpatient Prescriptions  Medication Sig Dispense Refill  . Ascorbic Acid (VITAMIN C) 100 MG tablet Take 100 mg by mouth daily.    Marland Kitchen aspirin 81 MG tablet Take 81 mg by mouth daily.      Marland Kitchen atenolol (TENORMIN) 100 MG tablet TAKE 1 TABLET BY MOUTH EVERY DAY 90 tablet 2  . atorvastatin (LIPITOR) 80 MG tablet Take 1 tablet (80 mg total) by mouth daily. 90 tablet 3  . calcium carbonate (TUMS - DOSED IN MG ELEMENTAL CALCIUM) 500 MG chewable tablet Chew 1 tablet by mouth daily as needed for indigestion or heartburn.    . donepezil (ARICEPT) 10 MG tablet Take 1/2 tablet daily for 1 month, then increase to 1 tablet daily 30 tablet 6  . lisinopril (PRINIVIL,ZESTRIL) 5 MG tablet Take 5 mg by mouth daily. Per Dr. Einar Gip    . nitroGLYCERIN (NITROSTAT) 0.4 MG SL tablet Place 1 tablet (0.4 mg total) under the tongue every 5 (five) minutes x 3 doses as needed for chest pain. 30  tablet 2  . Omega-3 Fatty Acids (FISH OIL) 1000 MG CAPS Take 1 capsule by mouth daily.     No current facility-administered medications for this visit.     Objective: BP (!) 152/74 (BP Location: Left Arm, Patient Position: Sitting, Cuff Size: Large)   Pulse 67   Temp 97.9 F (36.6 C) (Oral)   Ht 5\' 4"  (1.626 m)   Wt 182 lb 9.6 oz (82.8 kg)   SpO2 96%   BMI 31.34 kg/m  Gen: NAD, resting comfortably CV: RRR no murmurs rubs or gallops Lungs: CTAB no crackles, wheeze, rhonchi Abdomen: soft/nontender/nondistended/normal bowel sounds. obese Ext: trace edema Skin: warm, dry Neuro: grossly normal, moves all extremities  Assessment/Plan:  Essential hypertension S: controlled on atenolol 100mg  and lisinopril 5mg  on last check but patient has been taking atenolol every other day- I encouraged daily use and recheck in 1 month ASCVD 10 year risk calculation if age 48-79: has been prescribed statin but adherence issues BP Readings from Last 3 Encounters:  08/13/16 (!) 152/74  06/11/16 132/70  05/26/16 140/76  A/P: We discussed blood pressure goal of <140/90 with heart history. Continue current meds- but start atenolol back at daily  Mixed hyperlipidemia S: poorly controlled on no statin. No myalgias.  Lab Results  Component Value Date   CHOL 276 (A) 01/04/2015   HDL 45 01/04/2015  [place] 192 01/04/2015   LDLDIRECT 198.0 12/17/2015   TRIG 194 (A) 01/04/2015   CHOLHDL 6 11/29/2014   A/P: she has been prescribed statin but not taking regularly- I encouraged her to do so- will get direct LDL if she does in fact start regularly taking   Coronary atherosclerosis of native coronary artery S: feels a little more tired lately, feels run down/weak. Happened singing choir if standing long time. Still able to walk around without issues for most part. No chest pain or shortness of breath A/P: Patient's primary reason for visit was to see if she could stop any medication as she is concerned  medications make her tired. We went through each medication and reasoning for each medication- encouraged her to continue all prescribed. Her fatigue is very mild- we will recheck 1 month. She also has cardiology follo wup next month. If not making progress would update cbc, cmp, tsh next visit. I do not think her symptoms are cardiac in origin  Also for energy- we discussed restarting exercise with silver sneakers which she has really slacked off of.   Mild cognitive impairment Not really taking her aricept. States taking as needed- discussed would take daily if is going to take this.   1 month  Return precautions advised.  Garret Reddish, MD

## 2016-08-13 NOTE — Assessment & Plan Note (Signed)
Not really taking her aricept. States taking as needed- discussed would take daily if is going to take this.

## 2016-08-13 NOTE — Assessment & Plan Note (Signed)
S: poorly controlled on no statin. No myalgias.  Lab Results  Component Value Date   CHOL 276 (A) 01/04/2015   HDL 45 01/04/2015   LDLCALC 192 01/04/2015   LDLDIRECT 198.0 12/17/2015   TRIG 194 (A) 01/04/2015   CHOLHDL 6 11/29/2014   A/P: she has been prescribed statin but not taking regularly- I encouraged her to do so- will get direct LDL if she does in fact start regularly taking

## 2016-08-13 NOTE — Assessment & Plan Note (Addendum)
S: feels a little more tired lately, feels run down/weak. Happened singing choir if standing long time. Still able to walk around without issues for most part. No chest pain or shortness of breath A/P: Patient's primary reason for visit was to see if she could stop any medication as she is concerned medications make her tired. We went through each medication and reasoning for each medication- encouraged her to continue all prescribed. Her fatigue is very mild- we will recheck 1 month. She also has cardiology follo wup next month. If not making progress would update cbc, cmp, tsh next visit. I do not think her symptoms are cardiac in origin  Also for energy- we discussed restarting exercise with silver sneakers which she has really slacked off of.

## 2016-10-29 ENCOUNTER — Other Ambulatory Visit: Payer: Self-pay | Admitting: Family Medicine

## 2016-12-01 ENCOUNTER — Ambulatory Visit: Payer: Medicare HMO | Admitting: Neurology

## 2016-12-07 ENCOUNTER — Encounter: Payer: Self-pay | Admitting: Neurology

## 2016-12-07 ENCOUNTER — Ambulatory Visit (INDEPENDENT_AMBULATORY_CARE_PROVIDER_SITE_OTHER): Payer: Medicare HMO | Admitting: Neurology

## 2016-12-07 VITALS — BP 140/72 | HR 64 | Ht 63.0 in | Wt 183.0 lb

## 2016-12-07 DIAGNOSIS — D329 Benign neoplasm of meninges, unspecified: Secondary | ICD-10-CM

## 2016-12-07 DIAGNOSIS — I639 Cerebral infarction, unspecified: Secondary | ICD-10-CM

## 2016-12-07 DIAGNOSIS — G3184 Mild cognitive impairment, so stated: Secondary | ICD-10-CM

## 2016-12-07 MED ORDER — DONEPEZIL HCL 5 MG PO TABS: ORAL_TABLET | ORAL | 11 refills | Status: DC

## 2016-12-07 NOTE — Patient Instructions (Signed)
1. Please have your daughter come on the next visit 2. Start taking Aricept 5mg  daily 3. Start taking aspirin 81mg  daily 4. Continue control of blood pressure. Start monitoring your blood pressure on a daily basis 5. Control of blood pressure, cholesterol, as well as physical exercise and brain stimulation exercises are important for brain health 6. Follow-up in 6 months, call for any changes   RECOMMENDATIONS FOR ALL PATIENTS WITH MEMORY PROBLEMS: 1. Continue to exercise (Recommend 30 minutes of walking everyday, or 3 hours every week) 2. Increase social interactions - continue going to [place] and enjoy social gatherings with friends and family 3. Eat healthy, avoid fried foods and eat more fruits and vegetables 4. Maintain adequate blood pressure, blood sugar, and blood cholesterol level. Reducing the risk of stroke and cardiovascular disease also helps promoting better memory. 5. Avoid stressful situations. Live a simple life and avoid aggravations. Organize your time and prepare for the next day in anticipation. 6. Sleep well, avoid any interruptions of sleep and avoid any distractions in the bedroom that may interfere with adequate sleep quality 7. Avoid sugar, avoid sweets as there is a strong link between excessive sugar intake, diabetes, and cognitive impairment We discussed the Mediterranean diet, which has been shown to help patients reduce the risk of progressive memory disorders and reduces cardiovascular risk. This includes eating fish, eat fruits and green leafy vegetables, nuts like almonds and hazelnuts, walnuts, and also use olive oil. Avoid fast foods and fried foods as much as possible. Avoid sweets and sugar as sugar use has been linked to worsening of memory function.  There is always a concern of gradual progression of memory problems. If this is the case, then we may need to adjust level of care according to patient needs. Support, both to the patient and caregiver, should  then be put into place.

## 2016-12-07 NOTE — Progress Notes (Signed)
NEUROLOGY FOLLOW UP OFFICE NOTE  [name] [id number]  HISTORY OF PRESENT ILLNESS: I had the pleasure of seeing [name] in follow-up in the neurology clinic on 05/26/2016.  The patient was last seen 7 months ago for memory loss. She is again alone in the office today. MMSE in March 2018 was 29/30, however previously reported symptoms were suggestive of mild dementia. She was given a prescription for Aricept, but again reports that she has not been taking this consistently, she has a few pills at home. She does not recall if she had side effects, she had diarrhea one time but does not recall if it was when she took the Aricept. She feels her memory is fine. She continues to live alone, denies getting lost driving. She states she is in charge of bills (previously reported her daughter was working on bills), and denies any missed payments. She states she is taking her Lisinopril and Atenolol regularly. She denies any headaches, dizziness, vision changes, focal numbness/tingling/weakness, no falls. She had a MRI brain without contrast done 06/02/16 which I personally reviewed, no acute changes. There was a new nonacute small left parietal infarct with susceptibility artifact, new since 2012 imaging. She is asymptomatic from this. The right frontal meningioma is unchanged.   HPI 10/29/2015: This is an 83 yo RH woman with a history of hypertension, diabetes, right posterior frontal meningioma, with worsening memory. She reports that she forgets things and it is worse.She lives alone. Memory issues became noticeable around 3 years ago, her daughter is now trying to fix credit card interest payments that have grown over the years. In spite of this, she still gives to charities all the time, her daughter states she does not understand that she gives away money she does not have. Bills were being paid late. Her granddaughter set up automatic draft payments last month. Over the past 6 months, her daughter  has noticed she repeats herself in the same conversation. She has always had a good sense of direction, but had to ask where they were going on the way to her bank. She denies getting lost driving. She could not recall names of people she has known for a long time, such as cousins on her husband's side. Her daughter states her understanding is "way off," she asks a lot of questions about things which she would normally understand in the past. She denies missing her medications, but her daughter reminds her that she could not recall if she took her pill this morning. She has burned something on the stove twice recently when she got distracted by a phone call. Personal hygiene is maintained, she cleans the dishes and does laundry without difficulty, but her house is filled with articles/newspapers in her living room, with only a small pathway to walk through. Her daughter denies any personality changes, but states her mother gets frustrated with herself.   She denies any headaches, dizziness, diplopia, dysarthria, dysphagia, neck/back pain, focal numbness/tingling/weakness, bowel/bladder dysfunction. No anosmia, tremors. She has some left knee pain after tripping on a rug this week. She denies any significant head injuries. No family history of dementia.  I personally reviewed MRI brain without contrast done 09/12/2010 which did not show any acute changes, there was mild age-related atrophy and chronic small vessel disease. There is a chronically known 27mm calcified meningioma along the right posterior frontal convexity without significant mass effect.  PAST MEDICAL HISTORY: Past Medical History:  Diagnosis Date  . Allergic rhinitis due to  other allergen 06/14/2008   Qualifier: Diagnosis of  By: Arnoldo Morale MD, Balinda Quails   . Cellulitis and abscess of neck 03/18/2007   Qualifier: Diagnosis of  By: Arnoldo Morale MD, Balinda Quails   . Diabetes mellitus   . Fibrocystic breast disease   . Hypertension   . Meningioma ([place])   .  SEBACEOUS CYST, NECK 02/08/2007   Qualifier: Diagnosis of  By: Arnoldo Morale MD, Balinda Quails   . Tendon tear, ankle     MEDICATIONS: Current Outpatient Prescriptions on File Prior to Visit  Medication Sig Dispense Refill  . atenolol (TENORMIN) 100 MG tablet TAKE 1 TABLET BY MOUTH EVERY DAY 90 tablet 2  . lisinopril (PRINIVIL,ZESTRIL) 5 MG tablet Take 5 mg by mouth daily. Per Dr. Einar Gip    . Ascorbic Acid (VITAMIN C) 100 MG tablet Take 100 mg by mouth daily.    Marland Kitchen aspirin 81 MG tablet Take 81 mg by mouth daily.      Marland Kitchen atorvastatin (LIPITOR) 80 MG tablet Take 1 tablet (80 mg total) by mouth daily. (Patient not taking: Reported on 12/07/2016) 90 tablet 3  . calcium carbonate (TUMS - DOSED IN MG ELEMENTAL CALCIUM) 500 MG chewable tablet Chew 1 tablet by mouth daily as needed for indigestion or heartburn.    . donepezil (ARICEPT) 10 MG tablet Take 1/2 tablet daily for 1 month, then increase to 1 tablet daily (Patient not taking: Reported on 12/07/2016) 30 tablet 6  . nitroGLYCERIN (NITROSTAT) 0.4 MG SL tablet Place 1 tablet (0.4 mg total) under the tongue every 5 (five) minutes x 3 doses as needed for chest pain. (Patient not taking: Reported on 12/07/2016) 30 tablet 2  . Omega-3 Fatty Acids (FISH OIL) 1000 MG CAPS Take 1 capsule by mouth daily.     No current facility-administered medications on file prior to visit.     ALLERGIES: Allergies  Allergen Reactions  . Sulfonamide Derivatives Other (See Comments)    REACTION: childhood unknown type, ended up in hospital    FAMILY HISTORY: Family History  Problem Relation Age of Onset  . Stroke Mother   . Heart disease Father     SOCIAL HISTORY: Social History   Social History  . Marital status: Widowed    Spouse name: N/A  . Number of children: N/A  . Years of education: N/A   Occupational History  . Not on file.   Social History Main Topics  . Smoking status: Never Smoker  . Smokeless tobacco: Never Used  . Alcohol use No  . Drug use: No  .  Sexual activity: No   Other Topics Concern  . Not on file   Social History Narrative  . No narrative on file    REVIEW OF SYSTEMS: Constitutional: No fevers, chills, or sweats, no generalized fatigue, change in appetite Eyes: No visual changes, double vision, eye pain Ear, nose and throat: No hearing loss, ear pain, nasal congestion, sore throat Cardiovascular: No chest pain, palpitations Respiratory:  No shortness of breath at rest or with exertion, wheezes GastrointestinaI: No nausea, vomiting, diarrhea, abdominal pain, fecal incontinence Genitourinary:  No dysuria, urinary retention or frequency Musculoskeletal:  No neck pain, +back pain Integumentary: No rash, pruritus, skin lesions Neurological: as above Psychiatric: No depression, insomnia, anxiety Endocrine: No palpitations, fatigue, diaphoresis, mood swings, change in appetite, change in weight, increased thirst Hematologic/Lymphatic:  No anemia, purpura, petechiae. Allergic/Immunologic: no itchy/runny eyes, nasal congestion, recent allergic reactions, rashes  PHYSICAL EXAM: Vitals:   12/07/16 1125  BP: 140/72  Pulse: 64  SpO2: 96%   General: No acute distress Head:  Normocephalic/atraumatic Neck: supple, no paraspinal tenderness, full range of motion Heart:  Regular rate and rhythm Lungs:  Clear to auscultation bilaterally Back: No paraspinal tenderness Skin/Extremities: No rash, no edema Neurological Exam: alert and oriented to person, place, and time. No aphasia or dysarthria. Fund of knowledge is appropriate.  Recent and remote memory are intact.  Attention and concentration are normal.    Able to name objects and repeat phrases. CDT 4/5  MMSE - Mini Mental State Exam 12/07/2016 06/01/2016  Orientation to time 4 5  Orientation to Place 5 5  Registration 3 3  Attention/ Calculation 4 5  Recall 0 2  Language- name 2 objects 2 2  Language- repeat 1 1  Language- follow 3 step command 3 3  Language- read & follow  direction 1 1  Write a sentence 1 1  Copy design 1 1  Total score 25 29   Cranial nerves: Pupils equal, round, reactive to light. Extraocular movements intact with no nystagmus. Visual fields full. Facial sensation intact. No facial asymmetry. Tongue, uvula, palate midline.  Motor: Bulk and tone normal, muscle strength 5/5 throughout with no pronator drift.  Sensation to light touch intact.  No extinction to double simultaneous stimulation.  Deep tendon reflexes 2+ throughout, toes downgoing.  Finger to nose testing intact.  Gait slow and cautious, no ataxia.  Romberg negative.  IMPRESSION: This is an 83 yo RH woman with a history of hypertension, diabetes, right frontal meningioma, with worsening memory loss. Her neurological exam is non-focal, MMSE today shows a decline from her last visit, 25/30 today (29/30 in March 2018). Symptoms suggestive of mild cognitive impairment, possible mild dementia. She is again alone in the office today, unclear if information provided is accurate, she denies getting lost driving or missing any bills. She has not been taking the Aricept regularly, we discussed indications for medication and side effects, she will start low dose Aricept 5mg  daily. We discussed the small subcortical stroke on MRI, new since 2012, but not acute, she is asymptomatic from this. She was instructed to start a daily baby aspirin for secondary stroke prevention, we discussed the importance of control of vascular risk factors. She knows to go to the ER for any sudden change in symptoms. We again discussed the importance of control of vascular risk factors, physical exercise, and brain stimulation exercises for brain health. She will follow-up in 6 months and was again asked to make sure her daughter comes with her on the next visit. She knows to call for any changes.  Thank you for allowing me to participate in her care.  Please do not hesitate to call for any questions or concerns.  The duration  of this appointment visit was 25 minutes of face-to-face time with the patient.  Greater than 50% of this time was spent in counseling, explanation of diagnosis, planning of further management, and coordination of care.   Ellouise Newer, M.D.   CC: Dr. Yong Channel

## 2017-06-03 ENCOUNTER — Other Ambulatory Visit: Payer: Self-pay

## 2017-06-03 ENCOUNTER — Ambulatory Visit (INDEPENDENT_AMBULATORY_CARE_PROVIDER_SITE_OTHER): Payer: Medicare HMO | Admitting: Neurology

## 2017-06-03 ENCOUNTER — Encounter: Payer: Self-pay | Admitting: Neurology

## 2017-06-03 VITALS — BP 156/82 | HR 55 | Wt 188.0 lb

## 2017-06-03 DIAGNOSIS — F0390 Unspecified dementia without behavioral disturbance: Secondary | ICD-10-CM

## 2017-06-03 DIAGNOSIS — F03A0 Unspecified dementia, mild, without behavioral disturbance, psychotic disturbance, mood disturbance, and anxiety: Secondary | ICD-10-CM

## 2017-06-03 DIAGNOSIS — R69 Illness, unspecified: Secondary | ICD-10-CM | POA: Diagnosis not present

## 2017-06-03 MED ORDER — DONEPEZIL HCL 10 MG PO TABS: ORAL_TABLET | ORAL | 11 refills | Status: AC

## 2017-06-03 NOTE — Progress Notes (Signed)
NEUROLOGY FOLLOW UP OFFICE NOTE  [name] [id number]  DOB: [birth year]  HISTORY OF PRESENT ILLNESS: I had the pleasure of seeing [name] in follow-up in the neurology clinic on 06/03/2017.  The patient was last seen 6 months ago for memory loss concerning for mild dementia. She is again alone in the office today. I had previously asked her to come with her daughter. She states that they are all busy, and that I may call [name] (currently not listed on her DPR). MMSE in October 2018 was 24/30 (29/30 in March 2018). She was given a prescription for Aricept, and once again asks me about it, showing me an open bottle that she saw yesterday. She thinks she took it, but is not sure. She drove to the office and denies getting lost, "if I have, I don't remember." She reports leaving the burner on all night one time on her stove. She reports one of her other daughter [name] took over some bills, "I just had so many things." She answers "I don't know" to a lot of questions and does note her memory is not as good. She lives alone and denies any difficulties with ADLs, stating she does her laundry herself. She denies any headaches, dizziness, vision changes, focal numbness/tingling/weakness, no falls.   HPI 10/29/2015: This is an 84 yo RH woman with a history of hypertension, diabetes, right posterior frontal meningioma, with worsening memory. She reports that she forgets things and it is worse.She lives alone. Memory issues became noticeable around 3 years ago, her daughter is now trying to fix credit card interest payments that have grown over the years. In spite of this, she still gives to charities all the time, her daughter states she does not understand that she gives away money she does not have. Bills were being paid late. Her granddaughter set up automatic draft payments last month. Over the past 6 months, her daughter has noticed she repeats herself in the same conversation. She has always  had a good sense of direction, but had to ask where they were going on the way to her bank. She denies getting lost driving. She could not recall names of people she has known for a long time, such as cousins on her husband's side. Her daughter states her understanding is "way off," she asks a lot of questions about things which she would normally understand in the past. She denies missing her medications, but her daughter reminds her that she could not recall if she took her pill this morning. She has burned something on the stove twice recently when she got distracted by a phone call. Personal hygiene is maintained, she cleans the dishes and does laundry without difficulty, but her house is filled with articles/newspapers in her living room, with only a small pathway to walk through. Her daughter denies any personality changes, but states her mother gets frustrated with herself.   She denies any headaches, dizziness, diplopia, dysarthria, dysphagia, neck/back pain, focal numbness/tingling/weakness, bowel/bladder dysfunction. No anosmia, tremors. She has some left knee pain after tripping on a rug this week. She denies any significant head injuries. No family history of dementia.  I personally reviewed MRI brain without contrast done 09/12/2010 which did not show any acute changes, there was mild age-related atrophy and chronic small vessel disease. There is a chronically known 80mm calcified meningioma along the right posterior frontal convexity without significant mass effect. Repeat MRI brain without contrast done 06/02/16 showed no acute changes. There was  a new nonacute small left parietal infarct with susceptibility artifact, new since 2012 imaging. She is asymptomatic from this. The right frontal meningioma is unchanged.   PAST MEDICAL HISTORY: Past Medical History:  Diagnosis Date  . Allergic rhinitis due to other allergen 06/14/2008   Qualifier: Diagnosis of  By: Arnoldo Morale MD, Balinda Quails   . Cellulitis  and abscess of neck 03/18/2007   Qualifier: Diagnosis of  By: Arnoldo Morale MD, Balinda Quails   . Diabetes mellitus   . Fibrocystic breast disease   . Hypertension   . Meningioma ([place])   . SEBACEOUS CYST, NECK 02/08/2007   Qualifier: Diagnosis of  By: Arnoldo Morale MD, Balinda Quails   . Tendon tear, ankle     MEDICATIONS: Current Outpatient Medications on File Prior to Visit  Medication Sig Dispense Refill  . Ascorbic Acid (VITAMIN C) 100 MG tablet Take 100 mg by mouth daily.    Marland Kitchen aspirin 81 MG tablet Take 81 mg by mouth daily.      Marland Kitchen atenolol (TENORMIN) 100 MG tablet TAKE 1 TABLET BY MOUTH EVERY DAY 90 tablet 2  . atorvastatin (LIPITOR) 80 MG tablet Take 1 tablet (80 mg total) by mouth daily. (Patient not taking: Reported on 12/07/2016) 90 tablet 3  . calcium carbonate (TUMS - DOSED IN MG ELEMENTAL CALCIUM) 500 MG chewable tablet Chew 1 tablet by mouth daily as needed for indigestion or heartburn.    . donepezil (ARICEPT) 5 MG tablet Take 1 tablet daily 30 tablet 11  . lisinopril (PRINIVIL,ZESTRIL) 5 MG tablet Take 5 mg by mouth daily. Per Dr. Einar Gip    . nitroGLYCERIN (NITROSTAT) 0.4 MG SL tablet Place 1 tablet (0.4 mg total) under the tongue every 5 (five) minutes x 3 doses as needed for chest pain. (Patient not taking: Reported on 12/07/2016) 30 tablet 2  . Omega-3 Fatty Acids (FISH OIL) 1000 MG CAPS Take 1 capsule by mouth daily.     No current facility-administered medications on file prior to visit.     ALLERGIES: Allergies  Allergen Reactions  . Sulfonamide Derivatives Other (See Comments)    REACTION: childhood unknown type, ended up in hospital    FAMILY HISTORY: Family History  Problem Relation Age of Onset  . Stroke Mother   . Heart disease Father     SOCIAL HISTORY: Social History   Socioeconomic History  . Marital status: Widowed    Spouse name: Not on file  . Number of children: Not on file  . Years of education: Not on file  . Highest education level: Not on file  Occupational  History  . Not on file  Social Needs  . Financial resource strain: Not on file  . Food insecurity:    Worry: Not on file    Inability: Not on file  . Transportation needs:    Medical: Not on file    Non-medical: Not on file  Tobacco Use  . Smoking status: Never Smoker  . Smokeless tobacco: Never Used  Substance and Sexual Activity  . Alcohol use: No  . Drug use: No  . Sexual activity: Never  Lifestyle  . Physical activity:    Days per week: Not on file    Minutes per session: Not on file  . Stress: Not on file  Relationships  . Social connections:    Talks on phone: Not on file    Gets together: Not on file    Attends religious service: Not on file    Active member of  club or organization: Not on file    Attends meetings of clubs or organizations: Not on file    Relationship status: Not on file  . Intimate partner violence:    Fear of current or ex partner: Not on file    Emotionally abused: Not on file    Physically abused: Not on file    Forced sexual activity: Not on file  Other Topics Concern  . Not on file  Social History Narrative  . Not on file    REVIEW OF SYSTEMS: Constitutional: No fevers, chills, or sweats, no generalized fatigue, change in appetite Eyes: No visual changes, double vision, eye pain Ear, nose and throat: No hearing loss, ear pain, nasal congestion, sore throat Cardiovascular: No chest pain, palpitations Respiratory:  No shortness of breath at rest or with exertion, wheezes GastrointestinaI: No nausea, vomiting, diarrhea, abdominal pain, fecal incontinence Genitourinary:  No dysuria, urinary retention or frequency Musculoskeletal:  No neck pain, +back pain Integumentary: No rash, pruritus, skin lesions Neurological: as above Psychiatric: No depression, insomnia, anxiety Endocrine: No palpitations, fatigue, diaphoresis, mood swings, change in appetite, change in weight, increased thirst Hematologic/Lymphatic:  No anemia, purpura,  petechiae. Allergic/Immunologic: no itchy/runny eyes, nasal congestion, recent allergic reactions, rashes  PHYSICAL EXAM: Vitals:   06/03/17 1134  BP: (!) 156/82  Pulse: (!) 55  SpO2: 96%   General: No acute distress Head:  Normocephalic/atraumatic Neck: supple, no paraspinal tenderness, full range of motion Heart:  Regular rate and rhythm Lungs:  Clear to auscultation bilaterally Back: No paraspinal tenderness Skin/Extremities: No rash, no edema Neurological Exam: alert and oriented to person, place, and time. No aphasia or dysarthria. Fund of knowledge is appropriate.  Recent and remote memory are impaired.  Attention and concentration are normal.    Able to name objects and repeat phrases. CDT 5/5  MMSE - Mini Mental State Exam 06/03/2017 12/07/2016 06/01/2016  Orientation to time 4 4 5   Orientation to Place 5 5 5   Registration 3 3 3   Attention/ Calculation 5 4 5   Recall 0 0 2  Language- name 2 objects 2 2 2   Language- repeat 1 1 1   Language- follow 3 step command 2 3 3   Language- read & follow direction 1 1 1   Write a sentence 1 1 1   Copy design 1 1 1   Total score 25 25 29    Cranial nerves: Pupils equal, round, reactive to light. Extraocular movements intact with no nystagmus. Visual fields full. Facial sensation intact. No facial asymmetry. Tongue, uvula, palate midline.  Motor: Bulk and tone normal, muscle strength 5/5 throughout with no pronator drift.  Sensation to light touch intact.  No extinction to double simultaneous stimulation.  Deep tendon reflexes 2+ throughout, toes downgoing.  Finger to nose testing intact.  Gait slow and cautious, no ataxia.  Romberg negative.  IMPRESSION: This is an 84 yo RH woman with a history of hypertension, diabetes, right frontal meningioma, with worsening memory loss. Her neurological exam is non-focal, MMSE today 25/30 (25/30 in October 2018, 29/30 in March 2018). She again presents not sure about the Donepezil that was again prescribed on  her last visit. We had a similar conversation on her last visit and she was instructed to make sure her daughter comes on this visit. I believe she has mild dementia, but no further corroborating history is available. We again discussed taking the Donepezil regularly. I will speak with her daughter [name]. We again discussed the importance of control of vascular risk factors,  physical exercise, and brain stimulation exercises for brain health. She will follow-up in 6 months and was again asked to make sure her daughter comes with her on the next visit. She knows to call for any changes.  Thank you for allowing me to participate in her care.  Please do not hesitate to call for any questions or concerns.  The duration of this appointment visit was 25 minutes of face-to-face time with the patient.  Greater than 50% of this time was spent in counseling, explanation of diagnosis, planning of further management, and coordination of care.   Ellouise Newer, M.D.   CC: Dr. Yong Channel

## 2017-06-03 NOTE — Patient Instructions (Addendum)
1. Start taking the Donepezil 10mg  every day 2. Follow-up in 6 months, please bring one of your children with the visit  FALL PRECAUTIONS: Be cautious when walking. Scan the area for obstacles that may increase the risk of trips and falls. When getting up in the mornings, sit up at the edge of the bed for a few minutes before getting out of bed. Consider elevating the bed at the head end to avoid drop of blood pressure when getting up. Walk always in a well-lit room (use night lights in the walls). Avoid area rugs or power cords from appliances in the middle of the walkways. Use a walker or a cane if necessary and consider physical therapy for balance exercise. Get your eyesight checked regularly.  FINANCIAL OVERSIGHT: Supervision, especially oversight when making financial decisions or transactions is also recommended.  HOME SAFETY: Consider the safety of the kitchen when operating appliances like stoves, microwave oven, and blender. Consider having supervision and share cooking responsibilities until no longer able to participate in those. Accidents with firearms and other hazards in the house should be identified and addressed as well.  DRIVING: Regarding driving, in patients with progressive memory problems, driving will be impaired. We advise to have someone else do the driving if trouble finding directions or if minor accidents are reported. Independent driving assessment is available to determine safety of driving.  ABILITY TO BE LEFT ALONE: If patient is unable to contact 911 operator, consider using LifeLine, or when the need is there, arrange for someone to stay with patients. Smoking is a fire hazard, consider supervision or cessation. Risk of wandering should be assessed by caregiver and if detected at any point, supervision and safe proof recommendations should be instituted.  MEDICATION SUPERVISION: Inability to self-administer medication needs to be constantly addressed. Implement a  mechanism to ensure safe administration of the medications.  RECOMMENDATIONS FOR ALL PATIENTS WITH MEMORY PROBLEMS: 1. Continue to exercise (Recommend 30 minutes of walking everyday, or 3 hours every week) 2. Increase social interactions - continue going to [place] and enjoy social gatherings with friends and family 3. Eat healthy, avoid fried foods and eat more fruits and vegetables 4. Maintain adequate blood pressure, blood sugar, and blood cholesterol level. Reducing the risk of stroke and cardiovascular disease also helps promoting better memory. 5. Avoid stressful situations. Live a simple life and avoid aggravations. Organize your time and prepare for the next day in anticipation. 6. Sleep well, avoid any interruptions of sleep and avoid any distractions in the bedroom that may interfere with adequate sleep quality 7. Avoid sugar, avoid sweets as there is a strong link between excessive sugar intake, diabetes, and cognitive impairment We discussed the Mediterranean diet, which has been shown to help patients reduce the risk of progressive memory disorders and reduces cardiovascular risk. This includes eating fish, eat fruits and green leafy vegetables, nuts like almonds and hazelnuts, walnuts, and also use olive oil. Avoid fast foods and fried foods as much as possible. Avoid sweets and sugar as sugar use has been linked to worsening of memory function.  There is always a concern of gradual progression of memory problems. If this is the case, then we may need to adjust level of care according to patient needs. Support, both to the patient and caregiver, should then be put into place.

## 2017-06-07 IMAGING — DX DG CHEST 2V
2 series · 2 of 2 positions shown · non-contrast
Comparison: 04/20/2013

CLINICAL DATA: Chest pain for 1 day.

EXAM:
CHEST  2 VIEW

[chest pa]
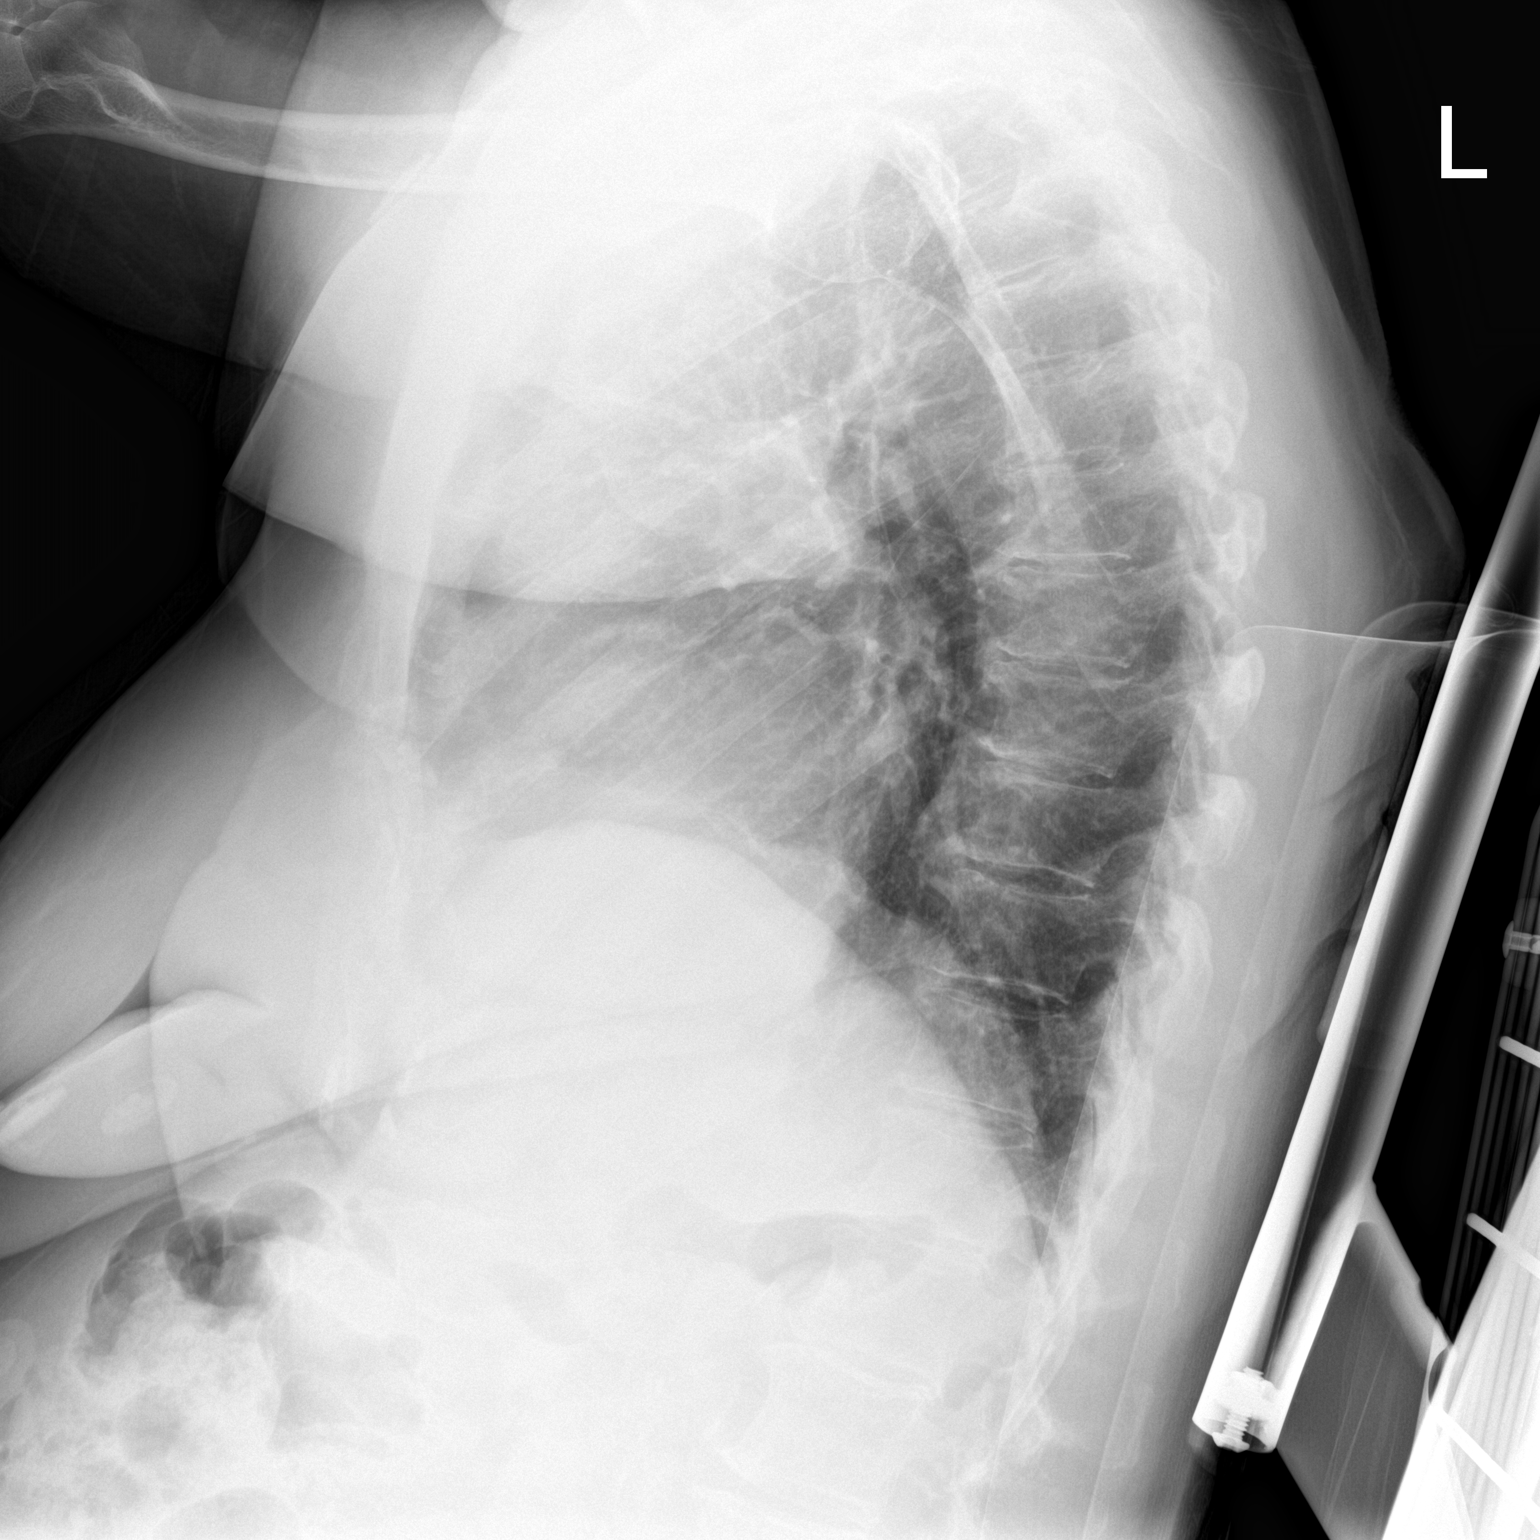

[chest ap]
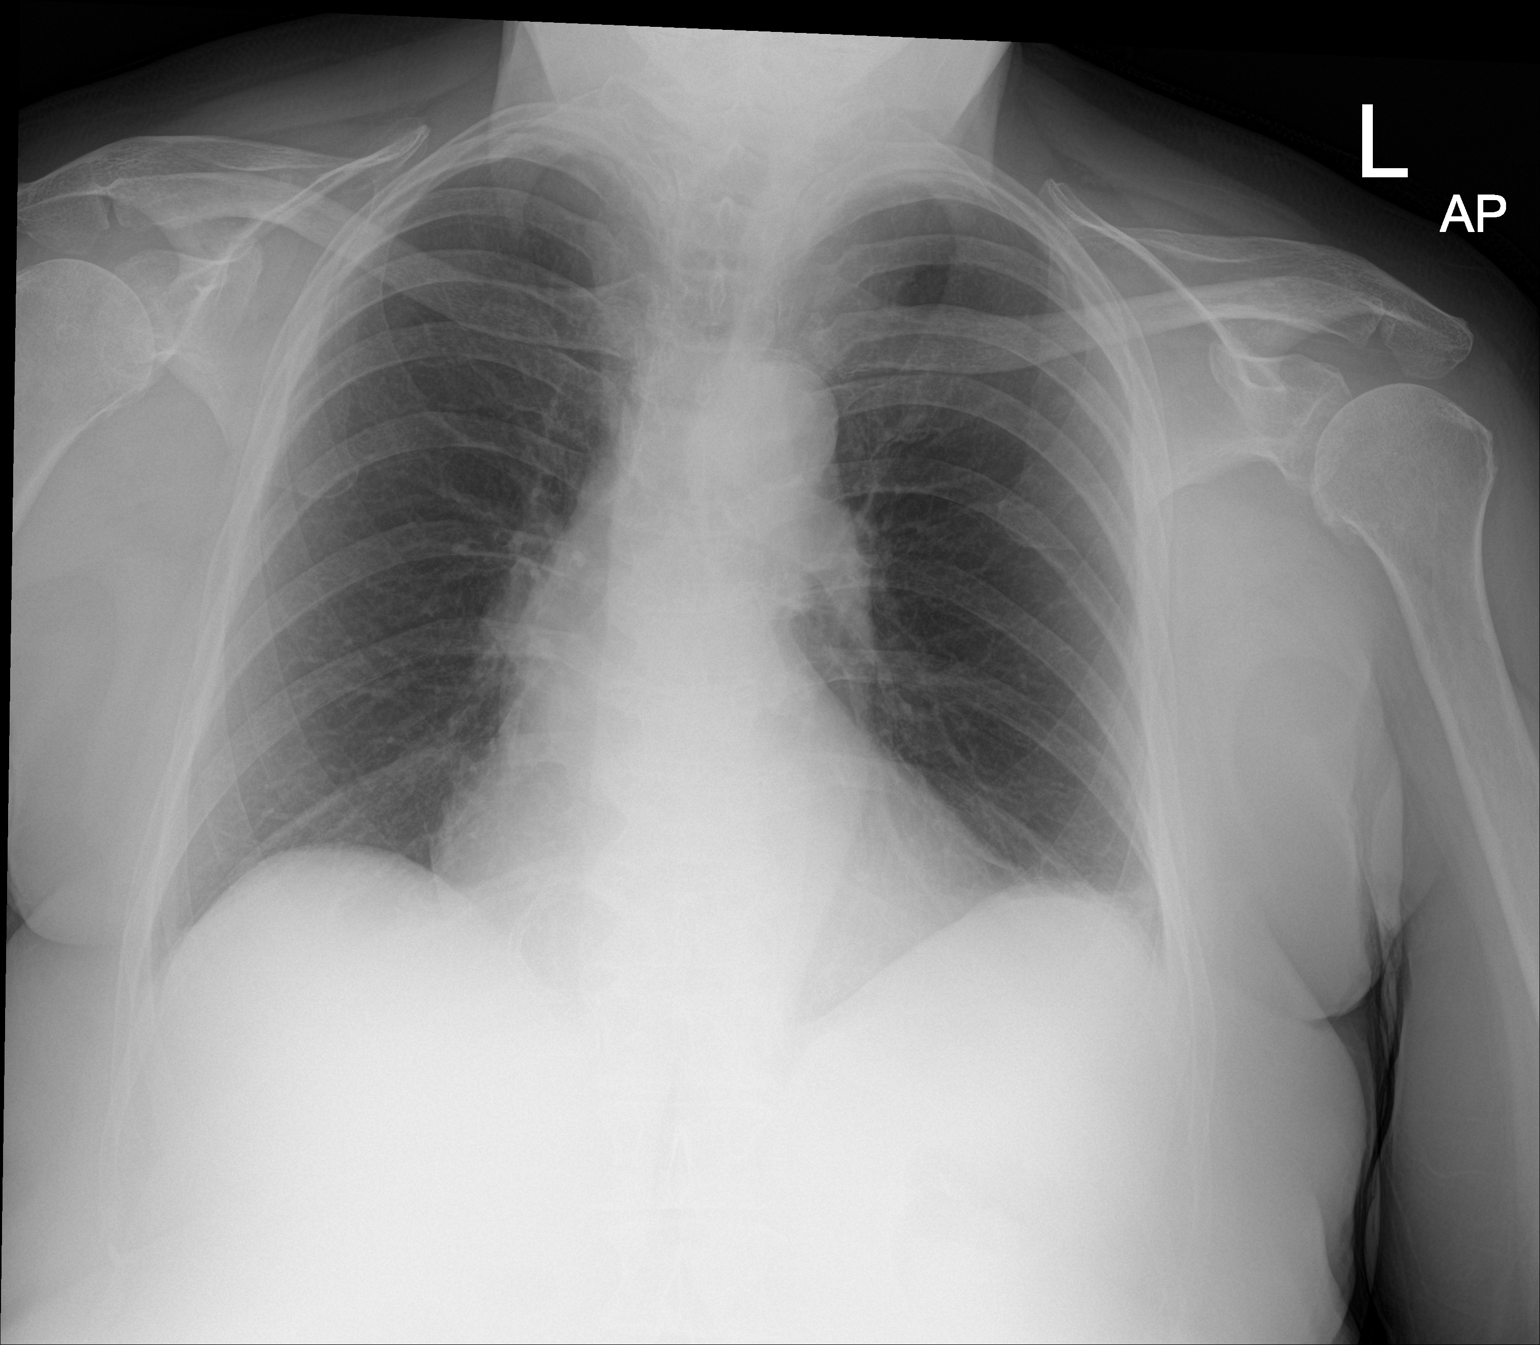

[2 of 2 positions shown; findings below may reference images not displayed]

FINDINGS: The heart size and mediastinal contours are within normal limits.
Both lungs are clear. The visualized skeletal structures are
unremarkable.
IMPRESSION: No active cardiopulmonary disease.

## 2017-06-10 ENCOUNTER — Encounter: Payer: Self-pay | Admitting: Family Medicine

## 2017-06-10 ENCOUNTER — Ambulatory Visit (INDEPENDENT_AMBULATORY_CARE_PROVIDER_SITE_OTHER): Payer: Medicare HMO | Admitting: Family Medicine

## 2017-06-10 VITALS — BP 138/72 | HR 66 | Temp 97.8°F | Ht 63.0 in | Wt 188.8 lb

## 2017-06-10 DIAGNOSIS — R5383 Other fatigue: Secondary | ICD-10-CM

## 2017-06-10 DIAGNOSIS — E782 Mixed hyperlipidemia: Secondary | ICD-10-CM | POA: Diagnosis not present

## 2017-06-10 DIAGNOSIS — D329 Benign neoplasm of meninges, unspecified: Secondary | ICD-10-CM

## 2017-06-10 DIAGNOSIS — R739 Hyperglycemia, unspecified: Secondary | ICD-10-CM | POA: Diagnosis not present

## 2017-06-10 DIAGNOSIS — I10 Essential (primary) hypertension: Secondary | ICD-10-CM | POA: Diagnosis not present

## 2017-06-10 DIAGNOSIS — I480 Paroxysmal atrial fibrillation: Secondary | ICD-10-CM | POA: Diagnosis not present

## 2017-06-10 MED ORDER — FLUTICASONE PROPIONATE 50 MCG/ACT NA SUSP: 2.0000 | Freq: Every day | NASAL | 3 refills | Status: AC

## 2017-06-10 MED ORDER — APIXABAN 5 MG PO TABS: 5.0000 mg | ORAL_TABLET | Freq: Two times a day (BID) | ORAL | 3 refills | Status: AC

## 2017-06-10 MED ORDER — ATORVASTATIN CALCIUM 80 MG PO TABS: 80.0000 mg | ORAL_TABLET | Freq: Every day | ORAL | 3 refills | Status: AC

## 2017-06-10 MED ORDER — OMEPRAZOLE 20 MG PO CPDR: 20.0000 mg | DELAYED_RELEASE_CAPSULE | Freq: Every day | ORAL | 3 refills | Status: AC

## 2017-06-10 NOTE — Assessment & Plan Note (Signed)
S: poorly controlled on fish oil alone. She has declined statin in the past. Discussed today history of stroke and heart attack- she reconsiders Lab Results  Component Value Date   CHOL 276 (A) 01/04/2015   HDL 45 01/04/2015   LDLCALC 192 01/04/2015   LDLDIRECT 198.0 12/17/2015   TRIG 194 (A) 01/04/2015   CHOLHDL 6 11/29/2014   A/P: allows me to send in atorvastatin 80mg . She has been very poor about compliance- I really am hopeful she takes this though

## 2017-06-10 NOTE — Assessment & Plan Note (Signed)
With history of stroke on MRI- she agrees to restart eliquis. Though with her memory issues I am not confident she will take this regularly- plus even prior to memory worsening she struggled with noncompliance.

## 2017-06-10 NOTE — Assessment & Plan Note (Signed)
S: controlled on atenolol 100mg  and  lisinopril 5mg  BP Readings from Last 3 Encounters:  06/10/17 138/72  06/03/17 (!) 156/82  12/07/16 140/72  A/P: We discussed blood pressure goal of <140/90. Continue current meds

## 2017-06-10 NOTE — Assessment & Plan Note (Signed)
Stable on recent MRI- doubt this is the cause of her fatigue

## 2017-06-10 NOTE — Progress Notes (Signed)
Subjective:  [name] is a 84 y.o. year old very pleasant female patient who presents for/with See problem oriented charting ROS- has fatigue, runny nose. Denies chest pain other than at times with swallowing. No unintentional weight loss.    Past Medical History-  Patient Active Problem List   Diagnosis Date Noted  . Mild cognitive impairment 10/31/2015    Priority: High  . Coronary atherosclerosis of native coronary artery 04/18/2013    Priority: High  . Paroxysmal atrial fibrillation ([place]) 04/18/2013    Priority: High  . NSTEMI (non-ST elevated myocardial infarction) ([place]) 04/16/2013    Priority: High  . Meningioma ([place]) 10/31/2015    Priority: Medium  . Mixed hyperlipidemia 04/23/2013    Priority: Medium  . Essential hypertension 09/21/2006    Priority: Medium  . Hyperglycemia 01/21/2010    Priority: Low  . Osteopenia 06/19/2009    Priority: Low  . NECK PAIN 04/30/2008    Priority: Low  . MENINGIOMA 09/21/2006    Priority: Low  . FIBROCYSTIC BREAST DISEASE 09/21/2006    Priority: Low  . Small vessel stroke ([place]) 12/07/2016    Medications- reviewed and updated Current Outpatient Medications  Medication Sig Dispense Refill  . Ascorbic Acid (VITAMIN C) 100 MG tablet Take 100 mg by mouth daily.    Marland Kitchen aspirin 81 MG tablet Take 81 mg by mouth daily.      Marland Kitchen atenolol (TENORMIN) 100 MG tablet TAKE 1 TABLET BY MOUTH EVERY DAY 90 tablet 2  . calcium carbonate (TUMS - DOSED IN MG ELEMENTAL CALCIUM) 500 MG chewable tablet Chew 1 tablet by mouth daily as needed for indigestion or heartburn.    . donepezil (ARICEPT) 10 MG tablet Take one tablet daily 30 tablet 11  . lisinopril (PRINIVIL,ZESTRIL) 5 MG tablet Take 5 mg by mouth daily. Per Dr. Einar Gip    . Omega-3 Fatty Acids (FISH OIL) 1000 MG CAPS Take 1 capsule by mouth daily.     No current facility-administered medications for this visit.     Objective: BP 138/72 (BP Location: Left Arm, Patient Position: Sitting, Cuff  Size: Large)   Pulse 66   Temp 97.8 F (36.6 C) (Oral)   Ht 5\' 3"  (1.6 m)   Wt 188 lb 12.8 oz (85.6 kg)   SpO2 95%   BMI 33.44 kg/m  Gen: NAD, resting comfortably Ears normal, some rhinorrhea, pharynx largely normal CV: RRR no murmurs rubs or gallops Lungs: CTAB no crackles, wheeze, rhonchi Abdomen: soft/nontender/nondistended/normal bowel sounds. overweight Ext: no edema Skin: warm, dry Neuro: grossly normal, moves all extremities  Assessment/Plan:   Fatigue Possible allergic rhinitis Possible GERD S: feels fatigued and tired. Has been going on several months. No chest pain with this. No shortness of breath. No swelling in her legs.   Does have some runny nose. Rare sneezing. Minimal cough. Throat tickle at times.   Also notes occasional pain with swallowing. Tums helps some.   A/P: wonder if allergies contributing- trial flonase for 2-3 weeks. Also may have some reflux and will trial omeprazole- doubt this is cause of fatigue. Will get  cbc, cmp, tsh. With hyperglycemia history- will rule out diabetes development  Essential hypertension S: controlled on atenolol 100mg  and  lisinopril 5mg  BP Readings from Last 3 Encounters:  06/10/17 138/72  06/03/17 (!) 156/82  12/07/16 140/72  A/P: We discussed blood pressure goal of <140/90. Continue current meds  Mixed hyperlipidemia S: poorly controlled on fish oil alone. She has declined statin in the  past. Discussed today history of stroke and heart attack- she reconsiders Lab Results  Component Value Date   CHOL 276 (A) 01/04/2015   HDL 45 01/04/2015   LDLCALC 192 01/04/2015   LDLDIRECT 198.0 12/17/2015   TRIG 194 (A) 01/04/2015   CHOLHDL 6 11/29/2014   A/P: allows me to send in atorvastatin 80mg . She has been very poor about compliance- I really am hopeful she takes this though  Meningioma ([place]) Stable on recent MRI- doubt this is the cause of her fatigue  Paroxysmal atrial fibrillation With history of stroke on MRI-  she agrees to restart eliquis. Though with her memory issues I am not confident she will take this regularly- plus even prior to memory worsening she struggled with noncompliance.    Future Appointments  Date Time Provider [place]  12/17/2017  3:00 PM Cameron Sprang, MD [facility] None   No follow-ups on file.  Lab/Order associations: Essential hypertension - Plan: Comprehensive metabolic panel, CBC with Differential/Platelet  Mixed hyperlipidemia - Plan: Comprehensive metabolic panel, CBC with Differential/Platelet, TSH  Other fatigue - Plan: Comprehensive metabolic panel, CBC with Differential/Platelet, TSH  Hyperglycemia - Plan: Hemoglobin A1c  Meds ordered this encounter  Medications  . atorvastatin (LIPITOR) 80 MG tablet    Sig: Take 1 tablet (80 mg total) by mouth daily.    Dispense:  90 tablet    Refill:  3  . apixaban (ELIQUIS) 5 MG TABS tablet    Sig: Take 1 tablet (5 mg total) by mouth 2 (two) times daily.    Dispense:  180 tablet    Refill:  3  . fluticasone (FLONASE) 50 MCG/ACT nasal spray    Sig: Place 2 sprays into both nostrils daily.    Dispense:  16 g    Refill:  3  . omeprazole (PRILOSEC) 20 MG capsule    Sig: Take 1 capsule (20 mg total) by mouth daily.    Dispense:  30 capsule    Refill:  3    Return precautions advised.  Garret Reddish, MD

## 2017-06-10 NOTE — Patient Instructions (Signed)
Please bring a family member or friend with you to each medical visit.   Try flonase 2-3 weeks to see if helps with runny nose and allergies. This could contribute to fatigue  Try omeprazole for 1 month to see if helps with swallowing issues. If you have trouble swallowing food - we may need to consider referring you to stomach doctors for a camera examination of the throat   Restart eliquis to help with stroke provention and aspirin to protect your heart given your heart attack history  Start atorvastatin for cholesterol and reduce risk of heart attack or stroke  Please schedule follow up with me within 2-4 weeks so we can check back in  Please stop by lab before you go

## 2017-06-11 LAB — COMPREHENSIVE METABOLIC PANEL
ALT: 13 U/L (ref 0–35)
AST: 14 U/L (ref 0–37)
Albumin: 4 g/dL (ref 3.5–5.2)
Alkaline Phosphatase: 82 U/L (ref 39–117)
BUN: 14 mg/dL (ref 6–23)
CALCIUM: 9.4 mg/dL (ref 8.4–10.5)
CHLORIDE: 103 meq/L (ref 96–112)
CO2: 31 meq/L (ref 19–32)
CREATININE: 0.82 mg/dL (ref 0.40–1.20)
GFR: 70.69 mL/min (ref >60.00)
Glucose, Bld: 116 mg/dL — ABNORMAL HIGH (ref 70–99)
POTASSIUM: 4 meq/L (ref 3.5–5.1)
Sodium: 141 mEq/L (ref 135–145)
Total Bilirubin: 0.6 mg/dL (ref 0.2–1.2)
Total Protein: 6.6 g/dL (ref 6.0–8.3)

## 2017-06-11 LAB — CBC WITH DIFFERENTIAL/PLATELET
BASOS PCT: 1.1 % (ref 0.0–3.0)
Basophils Absolute: 0.1 10*3/uL (ref 0.0–0.1)
EOS ABS: 0.2 10*3/uL (ref 0.0–0.7)
EOS PCT: 3.4 % (ref 0.0–5.0)
HEMATOCRIT: 42.2 % (ref 36.0–46.0)
Hemoglobin: 14.1 g/dL (ref 12.0–15.0)
LYMPHS PCT: 23.7 % (ref 12.0–46.0)
Lymphs Abs: 1.6 10*3/uL (ref 0.7–4.0)
MCHC: 33.5 g/dL (ref 30.0–36.0)
MCV: 83.5 fl (ref 78.0–100.0)
MONOS PCT: 8.7 % (ref 3.0–12.0)
Monocytes Absolute: 0.6 10*3/uL (ref 0.1–1.0)
NEUTROS ABS: 4.4 10*3/uL (ref 1.4–7.7)
Neutrophils Relative %: 63.1 % (ref 43.0–77.0)
PLATELETS: 238 10*3/uL (ref 150.0–400.0)
RBC: 5.06 Mil/uL (ref 3.87–5.11)
RDW: 13.9 % (ref 11.5–15.5)
WBC: 6.9 10*3/uL (ref 4.0–10.5)

## 2017-06-11 LAB — TSH: TSH: 1.13 u[IU]/mL (ref 0.35–4.50)

## 2017-06-11 LAB — HEMOGLOBIN A1C: Hgb A1c MFr Bld: 6.2 % (ref 4.6–6.5)

## 2017-06-12 ENCOUNTER — Encounter: Payer: Self-pay | Admitting: Neurology

## 2017-06-14 ENCOUNTER — Telehealth: Payer: Self-pay

## 2017-06-14 NOTE — Telephone Encounter (Signed)
Called patient and left a message to call office back.  

## 2017-06-24 ENCOUNTER — Ambulatory Visit: Payer: Medicare HMO | Admitting: Family Medicine

## 2017-06-25 ENCOUNTER — Ambulatory Visit (INDEPENDENT_AMBULATORY_CARE_PROVIDER_SITE_OTHER): Payer: Medicare HMO | Admitting: Family Medicine

## 2017-06-25 ENCOUNTER — Encounter: Payer: Self-pay | Admitting: Family Medicine

## 2017-06-25 VITALS — BP 142/86 | HR 64 | Temp 98.1°F | Ht 63.0 in | Wt 189.0 lb

## 2017-06-25 DIAGNOSIS — I10 Essential (primary) hypertension: Secondary | ICD-10-CM | POA: Diagnosis not present

## 2017-06-25 DIAGNOSIS — K219 Gastro-esophageal reflux disease without esophagitis: Secondary | ICD-10-CM

## 2017-06-25 DIAGNOSIS — F321 Major depressive disorder, single episode, moderate: Secondary | ICD-10-CM | POA: Diagnosis not present

## 2017-06-25 DIAGNOSIS — J301 Allergic rhinitis due to pollen: Secondary | ICD-10-CM | POA: Diagnosis not present

## 2017-06-25 DIAGNOSIS — R69 Illness, unspecified: Secondary | ICD-10-CM | POA: Diagnosis not present

## 2017-06-25 DIAGNOSIS — R5383 Other fatigue: Secondary | ICD-10-CM

## 2017-06-25 MED ORDER — LISINOPRIL 10 MG PO TABS: 10.0000 mg | ORAL_TABLET | Freq: Every day | ORAL | 3 refills | Status: AC

## 2017-06-25 NOTE — Assessment & Plan Note (Signed)
S: her phq9 was 10. Admits to anhedonia and depressed mood. No SI.  A/P: Moderate depression-could be the cause of her fatigue.  She does not want to take medication.  She does not want to pursue counseling right now but did give her handout.  Once again I really think having a family with her would be helpful.

## 2017-06-25 NOTE — Progress Notes (Signed)
Subjective:  [name] is a 84 y.o. year old very pleasant female patient who presents for/with See problem oriented charting ROS- admits to fatigue.  Denies chest pain or shortness of breath.  Admits to some depressed mood.  No SI  Past Medical History-  Patient Active Problem List   Diagnosis Date Noted  . Mild cognitive impairment 10/31/2015    Priority: High  . Coronary atherosclerosis of native coronary artery 04/18/2013    Priority: High  . Paroxysmal atrial fibrillation ([place]) 04/18/2013    Priority: High  . NSTEMI (non-ST elevated myocardial infarction) ([place]) 04/16/2013    Priority: High  . Depression, major, single episode, moderate ([place]) 06/25/2017    Priority: Medium  . Meningioma ([place]) 10/31/2015    Priority: Medium  . Mixed hyperlipidemia 04/23/2013    Priority: Medium  . Essential hypertension 09/21/2006    Priority: Medium  . Hyperglycemia 01/21/2010    Priority: Low  . Osteopenia 06/19/2009    Priority: Low  . NECK PAIN 04/30/2008    Priority: Low  . MENINGIOMA 09/21/2006    Priority: Low  . FIBROCYSTIC BREAST DISEASE 09/21/2006    Priority: Low  . Small vessel stroke ([place]) 12/07/2016    Medications- reviewed and updated Current Outpatient Medications  Medication Sig Dispense Refill  . apixaban (ELIQUIS) 5 MG TABS tablet Take 1 tablet (5 mg total) by mouth 2 (two) times daily. 180 tablet 3  . Ascorbic Acid (VITAMIN C) 100 MG tablet Take 100 mg by mouth daily.    Marland Kitchen aspirin 81 MG tablet Take 81 mg by mouth daily.      Marland Kitchen atenolol (TENORMIN) 100 MG tablet TAKE 1 TABLET BY MOUTH EVERY DAY 90 tablet 2  . atorvastatin (LIPITOR) 80 MG tablet Take 1 tablet (80 mg total) by mouth daily. 90 tablet 3  . calcium carbonate (TUMS - DOSED IN MG ELEMENTAL CALCIUM) 500 MG chewable tablet Chew 1 tablet by mouth daily as needed for indigestion or heartburn.    . donepezil (ARICEPT) 10 MG tablet Take one tablet daily 30 tablet 11  . fluticasone (FLONASE) 50 MCG/ACT  nasal spray Place 2 sprays into both nostrils daily. 16 g 3  . lisinopril (PRINIVIL,ZESTRIL) 10 MG tablet Take 1 tablet (10 mg total) by mouth daily. Increased dose from 5mg  on 06/25/17 90 tablet 3  . Omega-3 Fatty Acids (FISH OIL) 1000 MG CAPS Take 1 capsule by mouth daily.    Marland Kitchen omeprazole (PRILOSEC) 20 MG capsule Take 1 capsule (20 mg total) by mouth daily. 30 capsule 3   No current facility-administered medications for this visit.     Objective: BP (!) 142/86 (BP Location: Left Arm, Patient Position: Sitting, Cuff Size: Normal)   Pulse 64   Temp 98.1 F (36.7 C) (Oral)   Ht 5\' 3"  (1.6 m)   Wt 189 lb (85.7 kg)   SpO2 96%   BMI 33.48 kg/m  Gen: NAD, resting comfortably Less edematous nasal turbinates.  No cough during exam.  No rhinorrhea noted CV: RRR no murmurs rubs or gallops Lungs: CTAB no crackles, wheeze, rhonchi  Ext: no edema Skin: warm, dry Neuro: note some forgetfulness.  Assessment/Plan:  Other notes: 1.Patient is completely noncommittal on whether she is taking her medications other than her blood pressure medicines.  She seems to pick and choose and take them intermittently.  She does have memory issues and I really do think she needs a family member here.  I will have our staff reach out  to see if someone can come with her to next visit 2.  I did my best to reinforce each of her medications.  She once again is not able to taking her medicines regularly.  Fatigue S:  last visit for her fatigue we decided to trial flonase for 2-3 weeks. She had several months of fatigue with out edema, chest pain, shortness of breath- though did have runny nose, sneezing, cough, throat tickle.  We also added omeprazole for possible reflux though didn't think it was causing fatigue- she was having occasional pain with swallowing.   She feels less energy today despite regularly using flonase- runny nose and sneezing perhaps slightly better. No longer having pain with swallowing-though it  is not clear if she is taking omeprazole. A/P: allergic rhinitis- can continue Flonase if desired.  GERD- Unclear if even taking omeprazole but luckily no pain with swallowing anymore.  I really think her fatigue could be due to depression.  She has no chest pain or shortness of breath but I also recommended follow-up with her cardiologist Dr. Einar Gip.  Essential hypertension S: controlled poorly on atenolol 100mg  and lisinopril 5mg  last viist BP Readings from Last 3 Encounters:  06/25/17 (!) 142/86  06/10/17 138/72  06/03/17 (!) 156/82  A/P: We discussed blood pressure goal of <1 40/90. Continue current meds: But increase lisinopril to 10 mg and follow-up within 1 month. Asked her to bring family member for help- she declines-She is okay with [place] calling familyto see if they could come.   She admits to compliance issues with most of her medications but she does seem to take her blood pressure medicines consistently.  She is very clear that she took both medicines within 24 hours for blood pressure  Depression, major, single episode, moderate ([place]) S: her phq9 was 10. Admits to anhedonia and depressed mood. No SI.  A/P: Moderate depression-could be the cause of her fatigue.  She does not want to take medication.  She does not want to pursue counseling right now but did give her handout.  Once again I really think having a family with her would be helpful.   Future Appointments  Date Time Provider [place]  07/27/2017  3:30 PM Marin Olp, MD [facility]  12/17/2017  3:00 PM Cameron Sprang, MD [facility] None   Return in about 1 month (around 07/23/2017) for follow up- or sooner if needed.  Lab/Order associations: Essential hypertension  Depression, major, single episode, moderate (HCC)  Seasonal allergic rhinitis due to pollen  Gastroesophageal reflux disease without esophagitis  Other fatigue  Meds ordered this encounter  Medications  . lisinopril (PRINIVIL,ZESTRIL)  10 MG tablet    Sig: Take 1 tablet (10 mg total) by mouth daily. Increased dose from 5mg  on 06/25/17    Dispense:  90 tablet    Refill:  3    Return precautions advised.  Garret Reddish, MD

## 2017-06-25 NOTE — Patient Instructions (Addendum)
Increase lisinopril to 10mg . See me in 1 month for blood pressure recheck.   I would call one of our [facility] behavioral health counselors for counseling since you dont want to take medicine. Your depressed mood can cause fatigue  I also want you to see Dr. Einar Gip to make sure that he doesn't think your heart is causing fatigue  Please bring a family member that you trust with you to your visits Are either of your daughters in town so they could come with you? Can I call one of them- please give [place] the name of a person we could call about your care.

## 2017-06-25 NOTE — Assessment & Plan Note (Signed)
S: controlled poorly on atenolol 100mg  and lisinopril 5mg  last viist BP Readings from Last 3 Encounters:  06/25/17 (!) 142/86  06/10/17 138/72  06/03/17 (!) 156/82  A/P: We discussed blood pressure goal of <1 40/90. Continue current meds: But increase lisinopril to 10 mg and follow-up within 1 month. Asked her to bring family member for help- she declines-She is okay with [place] calling familyto see if they could come.   She admits to compliance issues with most of her medications but she does seem to take her blood pressure medicines consistently.  She is very clear that she took both medicines within 24 hours for blood pressure

## 2017-06-28 ENCOUNTER — Telehealth: Payer: Self-pay

## 2017-06-28 NOTE — Telephone Encounter (Signed)
I called and spoke with daughter Mariann Laster and asked that either her or her sister Abigail Butts attend the next appointment to help [place] ensure we are providing the best care possible. We confirmed the next appointment date. She verbalized understanding and stated one of them would be there.

## 2017-06-28 NOTE — Telephone Encounter (Signed)
-----   Message from Marin Olp, MD sent at 06/25/2017 11:28 PM EDT ----- [name]- cant remember who she said she preferred [place] to call but anyway "She does have memory issues and I really do think she needs a family member here.  I will have our staff reach out to see if someone can come with her to next visit". Her next visit is on 5/28 at 3 30 pm.

## 2017-07-17 ENCOUNTER — Inpatient Hospital Stay (HOSPITAL_COMMUNITY)
Admission: EM | Admit: 2017-07-17 | Discharge: 2017-07-31 | DRG: 215 | Disposition: E | Payer: Medicare HMO | Attending: Cardiology | Admitting: Cardiology

## 2017-07-17 ENCOUNTER — Ambulatory Visit (HOSPITAL_COMMUNITY): Admit: 2017-07-17 | Payer: Medicare HMO | Admitting: Interventional Cardiology

## 2017-07-17 ENCOUNTER — Encounter (HOSPITAL_COMMUNITY): Payer: Self-pay

## 2017-07-17 ENCOUNTER — Inpatient Hospital Stay (HOSPITAL_COMMUNITY): Admission: EM | Disposition: E | Payer: Self-pay | Source: Home / Self Care | Attending: Cardiology

## 2017-07-17 ENCOUNTER — Other Ambulatory Visit: Payer: Self-pay

## 2017-07-17 ENCOUNTER — Emergency Department (HOSPITAL_COMMUNITY): Payer: Medicare HMO

## 2017-07-17 ENCOUNTER — Inpatient Hospital Stay (HOSPITAL_COMMUNITY): Payer: Medicare HMO

## 2017-07-17 DIAGNOSIS — J9601 Acute respiratory failure with hypoxia: Secondary | ICD-10-CM | POA: Diagnosis not present

## 2017-07-17 DIAGNOSIS — I5021 Acute systolic (congestive) heart failure: Secondary | ICD-10-CM | POA: Diagnosis not present

## 2017-07-17 DIAGNOSIS — A419 Sepsis, unspecified organism: Secondary | ICD-10-CM | POA: Diagnosis not present

## 2017-07-17 DIAGNOSIS — S7011XA Contusion of right thigh, initial encounter: Secondary | ICD-10-CM | POA: Diagnosis not present

## 2017-07-17 DIAGNOSIS — F0390 Unspecified dementia without behavioral disturbance: Secondary | ICD-10-CM | POA: Diagnosis present

## 2017-07-17 DIAGNOSIS — I213 ST elevation (STEMI) myocardial infarction of unspecified site: Secondary | ICD-10-CM | POA: Diagnosis not present

## 2017-07-17 DIAGNOSIS — X58XXXA Exposure to other specified factors, initial encounter: Secondary | ICD-10-CM | POA: Diagnosis not present

## 2017-07-17 DIAGNOSIS — I998 Other disorder of circulatory system: Secondary | ICD-10-CM | POA: Diagnosis not present

## 2017-07-17 DIAGNOSIS — E876 Hypokalemia: Secondary | ICD-10-CM | POA: Diagnosis not present

## 2017-07-17 DIAGNOSIS — Z7982 Long term (current) use of aspirin: Secondary | ICD-10-CM

## 2017-07-17 DIAGNOSIS — N179 Acute kidney failure, unspecified: Secondary | ICD-10-CM | POA: Diagnosis present

## 2017-07-17 DIAGNOSIS — R112 Nausea with vomiting, unspecified: Secondary | ICD-10-CM

## 2017-07-17 DIAGNOSIS — Z978 Presence of other specified devices: Secondary | ICD-10-CM

## 2017-07-17 DIAGNOSIS — J810 Acute pulmonary edema: Secondary | ICD-10-CM | POA: Diagnosis not present

## 2017-07-17 DIAGNOSIS — I110 Hypertensive heart disease with heart failure: Secondary | ICD-10-CM | POA: Diagnosis present

## 2017-07-17 DIAGNOSIS — Z9119 Patient's noncompliance with other medical treatment and regimen: Secondary | ICD-10-CM

## 2017-07-17 DIAGNOSIS — Z4682 Encounter for fitting and adjustment of non-vascular catheter: Secondary | ICD-10-CM | POA: Diagnosis not present

## 2017-07-17 DIAGNOSIS — I2510 Atherosclerotic heart disease of native coronary artery without angina pectoris: Secondary | ICD-10-CM | POA: Diagnosis not present

## 2017-07-17 DIAGNOSIS — R578 Other shock: Secondary | ICD-10-CM | POA: Diagnosis not present

## 2017-07-17 DIAGNOSIS — R918 Other nonspecific abnormal finding of lung field: Secondary | ICD-10-CM | POA: Diagnosis not present

## 2017-07-17 DIAGNOSIS — Z452 Encounter for adjustment and management of vascular access device: Secondary | ICD-10-CM

## 2017-07-17 DIAGNOSIS — R51 Headache: Secondary | ICD-10-CM | POA: Diagnosis not present

## 2017-07-17 DIAGNOSIS — Z9114 Patient's other noncompliance with medication regimen: Secondary | ICD-10-CM | POA: Diagnosis not present

## 2017-07-17 DIAGNOSIS — J9690 Respiratory failure, unspecified, unspecified whether with hypoxia or hypercapnia: Secondary | ICD-10-CM | POA: Diagnosis not present

## 2017-07-17 DIAGNOSIS — I4901 Ventricular fibrillation: Secondary | ICD-10-CM | POA: Diagnosis not present

## 2017-07-17 DIAGNOSIS — Z8249 Family history of ischemic heart disease and other diseases of the circulatory system: Secondary | ICD-10-CM | POA: Diagnosis not present

## 2017-07-17 DIAGNOSIS — I2102 ST elevation (STEMI) myocardial infarction involving left anterior descending coronary artery: Principal | ICD-10-CM

## 2017-07-17 DIAGNOSIS — R570 Cardiogenic shock: Secondary | ICD-10-CM | POA: Diagnosis not present

## 2017-07-17 DIAGNOSIS — E669 Obesity, unspecified: Secondary | ICD-10-CM | POA: Diagnosis present

## 2017-07-17 DIAGNOSIS — Z79899 Other long term (current) drug therapy: Secondary | ICD-10-CM | POA: Diagnosis not present

## 2017-07-17 DIAGNOSIS — E782 Mixed hyperlipidemia: Secondary | ICD-10-CM | POA: Diagnosis not present

## 2017-07-17 DIAGNOSIS — R001 Bradycardia, unspecified: Secondary | ICD-10-CM | POA: Diagnosis not present

## 2017-07-17 DIAGNOSIS — G9340 Encephalopathy, unspecified: Secondary | ICD-10-CM | POA: Diagnosis not present

## 2017-07-17 DIAGNOSIS — R059 Cough, unspecified: Secondary | ICD-10-CM

## 2017-07-17 DIAGNOSIS — Z4659 Encounter for fitting and adjustment of other gastrointestinal appliance and device: Secondary | ICD-10-CM

## 2017-07-17 DIAGNOSIS — I469 Cardiac arrest, cause unspecified: Secondary | ICD-10-CM | POA: Diagnosis not present

## 2017-07-17 DIAGNOSIS — J90 Pleural effusion, not elsewhere classified: Secondary | ICD-10-CM | POA: Diagnosis not present

## 2017-07-17 DIAGNOSIS — I252 Old myocardial infarction: Secondary | ICD-10-CM | POA: Diagnosis not present

## 2017-07-17 DIAGNOSIS — I480 Paroxysmal atrial fibrillation: Secondary | ICD-10-CM | POA: Diagnosis present

## 2017-07-17 DIAGNOSIS — Z823 Family history of stroke: Secondary | ICD-10-CM | POA: Diagnosis not present

## 2017-07-17 DIAGNOSIS — S301XXA Contusion of abdominal wall, initial encounter: Secondary | ICD-10-CM | POA: Diagnosis not present

## 2017-07-17 DIAGNOSIS — Z6836 Body mass index (BMI) 36.0-36.9, adult: Secondary | ICD-10-CM

## 2017-07-17 DIAGNOSIS — I472 Ventricular tachycardia: Secondary | ICD-10-CM | POA: Diagnosis not present

## 2017-07-17 DIAGNOSIS — J811 Chronic pulmonary edema: Secondary | ICD-10-CM

## 2017-07-17 DIAGNOSIS — I2109 ST elevation (STEMI) myocardial infarction involving other coronary artery of anterior wall: Secondary | ICD-10-CM

## 2017-07-17 DIAGNOSIS — E874 Mixed disorder of acid-base balance: Secondary | ICD-10-CM | POA: Diagnosis not present

## 2017-07-17 DIAGNOSIS — D6489 Other specified anemias: Secondary | ICD-10-CM | POA: Diagnosis present

## 2017-07-17 DIAGNOSIS — I509 Heart failure, unspecified: Secondary | ICD-10-CM | POA: Diagnosis not present

## 2017-07-17 DIAGNOSIS — Z7951 Long term (current) use of inhaled steroids: Secondary | ICD-10-CM | POA: Diagnosis not present

## 2017-07-17 DIAGNOSIS — R079 Chest pain, unspecified: Secondary | ICD-10-CM | POA: Diagnosis not present

## 2017-07-17 DIAGNOSIS — J9602 Acute respiratory failure with hypercapnia: Secondary | ICD-10-CM | POA: Diagnosis not present

## 2017-07-17 DIAGNOSIS — E861 Hypovolemia: Secondary | ICD-10-CM | POA: Diagnosis present

## 2017-07-17 DIAGNOSIS — R05 Cough: Secondary | ICD-10-CM

## 2017-07-17 DIAGNOSIS — Z7901 Long term (current) use of anticoagulants: Secondary | ICD-10-CM

## 2017-07-17 DIAGNOSIS — I25110 Atherosclerotic heart disease of native coronary artery with unstable angina pectoris: Secondary | ICD-10-CM | POA: Diagnosis not present

## 2017-07-17 DIAGNOSIS — R0989 Other specified symptoms and signs involving the circulatory and respiratory systems: Secondary | ICD-10-CM | POA: Diagnosis not present

## 2017-07-17 DIAGNOSIS — Z8673 Personal history of transient ischemic attack (TIA), and cerebral infarction without residual deficits: Secondary | ICD-10-CM

## 2017-07-17 DIAGNOSIS — Z955 Presence of coronary angioplasty implant and graft: Secondary | ICD-10-CM | POA: Diagnosis not present

## 2017-07-17 DIAGNOSIS — E1165 Type 2 diabetes mellitus with hyperglycemia: Secondary | ICD-10-CM | POA: Diagnosis present

## 2017-07-17 DIAGNOSIS — J9811 Atelectasis: Secondary | ICD-10-CM | POA: Diagnosis not present

## 2017-07-17 DIAGNOSIS — E860 Dehydration: Secondary | ICD-10-CM | POA: Diagnosis present

## 2017-07-17 DIAGNOSIS — R42 Dizziness and giddiness: Secondary | ICD-10-CM | POA: Diagnosis not present

## 2017-07-17 HISTORY — PX: LEFT HEART CATH AND CORONARY ANGIOGRAPHY: CATH118249

## 2017-07-17 HISTORY — PX: CORONARY/GRAFT ACUTE MI REVASCULARIZATION: CATH118305

## 2017-07-17 LAB — COMPREHENSIVE METABOLIC PANEL
ALT: 19 U/L (ref 14–54)
AST: 21 U/L (ref 15–41)
Albumin: 3.7 g/dL (ref 3.5–5.0)
Alkaline Phosphatase: 77 U/L (ref 38–126)
Anion gap: 15 (ref 5–15)
BILIRUBIN TOTAL: 1.6 mg/dL — AB (ref 0.3–1.2)
BUN: 14 mg/dL (ref 6–20)
CHLORIDE: 102 mmol/L (ref 101–111)
CO2: 23 mmol/L (ref 22–32)
CREATININE: 0.91 mg/dL (ref 0.44–1.00)
Calcium: 9.2 mg/dL (ref 8.9–10.3)
GFR calc non Af Amer: 57 mL/min — ABNORMAL LOW (ref >60)
Glucose, Bld: 191 mg/dL — ABNORMAL HIGH (ref 65–99)
Potassium: 3.8 mmol/L (ref 3.5–5.1)
Sodium: 140 mmol/L (ref 135–145)
Total Protein: 6.5 g/dL (ref 6.5–8.1)

## 2017-07-17 LAB — CBC WITH DIFFERENTIAL/PLATELET
Basophils Absolute: 0 10*3/uL (ref 0.0–0.1)
Basophils Relative: 0 %
EOS PCT: 0 %
Eosinophils Absolute: 0 10*3/uL (ref 0.0–0.7)
HEMATOCRIT: 44.3 % (ref 36.0–46.0)
Hemoglobin: 14.6 g/dL (ref 12.0–15.0)
LYMPHS ABS: 2.5 10*3/uL (ref 0.7–4.0)
LYMPHS PCT: 13 %
MCH: 27.5 pg (ref 26.0–34.0)
MCHC: 33 g/dL (ref 30.0–36.0)
MCV: 83.4 fL (ref 78.0–100.0)
MONO ABS: 1.6 10*3/uL — AB (ref 0.1–1.0)
Monocytes Relative: 9 %
NEUTROS ABS: 14.7 10*3/uL — AB (ref 1.7–7.7)
Neutrophils Relative %: 78 %
PLATELETS: 250 10*3/uL (ref 150–400)
RBC: 5.31 MIL/uL — AB (ref 3.87–5.11)
RDW: 13.2 % (ref 11.5–15.5)
WBC: 18.9 10*3/uL — AB (ref 4.0–10.5)

## 2017-07-17 LAB — I-STAT TROPONIN, ED: Troponin i, poc: 0 ng/mL (ref 0.00–0.08)

## 2017-07-17 LAB — LIPASE, BLOOD: LIPASE: 34 U/L (ref 11–51)

## 2017-07-17 LAB — MRSA PCR SCREENING: MRSA BY PCR: NEGATIVE

## 2017-07-17 LAB — TROPONIN I: TROPONIN I: 60.81 ng/mL — AB (ref <0.03)

## 2017-07-17 SURGERY — CORONARY/GRAFT ACUTE MI REVASCULARIZATION
Anesthesia: LOCAL

## 2017-07-17 MED ORDER — FLUTICASONE PROPIONATE 50 MCG/ACT NA SUSP
2.0000 | Freq: Every day | NASAL | Status: DC
  Administered 2017-07-19: 2 via NASAL
  Filled 2017-07-17: qty 16

## 2017-07-17 MED ORDER — DONEPEZIL HCL 10 MG PO TABS
10.0000 mg | ORAL_TABLET | Freq: Every day | ORAL | Status: DC
  Administered 2017-07-19 (×2): 10 mg via ORAL
  Filled 2017-07-17 (×3): qty 1

## 2017-07-17 MED ORDER — ATENOLOL 25 MG PO TABS
12.5000 mg | ORAL_TABLET | Freq: Every day | ORAL | Status: DC
Start: 2017-07-17 — End: 2017-07-18
  Filled 2017-07-17: qty 1

## 2017-07-17 MED ORDER — HEPARIN SODIUM (PORCINE) 5000 UNIT/ML IJ SOLN
4000.0000 [IU] | INTRAMUSCULAR | Status: AC
  Administered 2017-07-17: 4000 [IU] via INTRAVENOUS
  Filled 2017-07-17: qty 0.8

## 2017-07-17 MED ORDER — FENTANYL CITRATE (PF) 100 MCG/2ML IJ SOLN
25.0000 ug | Freq: Once | INTRAMUSCULAR | Status: AC
  Administered 2017-07-17: 25 ug via INTRAVENOUS
  Filled 2017-07-17: qty 2

## 2017-07-17 MED ORDER — MIDAZOLAM HCL 2 MG/2ML IJ SOLN
INTRAMUSCULAR | Status: AC
  Filled 2017-07-17: qty 2

## 2017-07-17 MED ORDER — HEPARIN (PORCINE) IN NACL 2-0.9 UNITS/ML
INTRAMUSCULAR | Status: AC | PRN
  Administered 2017-07-17 (×2): 500 mL

## 2017-07-17 MED ORDER — VERAPAMIL HCL 2.5 MG/ML IV SOLN: INTRAVENOUS | Status: DC | PRN

## 2017-07-17 MED ORDER — SODIUM CHLORIDE 0.9 % IV SOLN
INTRAVENOUS | Status: AC | PRN
  Administered 2017-07-17: 1.75 mg/kg/h via INTRAVENOUS

## 2017-07-17 MED ORDER — TICAGRELOR 90 MG PO TABS
ORAL_TABLET | ORAL | Status: AC
  Filled 2017-07-17: qty 2

## 2017-07-17 MED ORDER — ONDANSETRON HCL 4 MG/2ML IJ SOLN
4.0000 mg | Freq: Four times a day (QID) | INTRAMUSCULAR | Status: DC | PRN
  Administered 2017-07-17 – 2017-07-18 (×2): 4 mg via INTRAVENOUS
  Filled 2017-07-17: qty 2

## 2017-07-17 MED ORDER — ASPIRIN 325 MG PO TABS
325.0000 mg | ORAL_TABLET | Freq: Once | ORAL | Status: AC
  Administered 2017-07-17: 325 mg via ORAL
  Filled 2017-07-17: qty 1

## 2017-07-17 MED ORDER — SODIUM CHLORIDE 0.9% FLUSH
3.0000 mL | Freq: Two times a day (BID) | INTRAVENOUS | Status: DC
  Administered 2017-07-18: 3 mL via INTRAVENOUS

## 2017-07-17 MED ORDER — MORPHINE SULFATE (PF) 2 MG/ML IV SOLN
2.0000 mg | INTRAVENOUS | Status: DC | PRN
  Administered 2017-07-17 – 2017-07-18 (×5): 2 mg via INTRAVENOUS
  Filled 2017-07-17 (×5): qty 1

## 2017-07-17 MED ORDER — PANTOPRAZOLE SODIUM 40 MG PO TBEC
40.0000 mg | DELAYED_RELEASE_TABLET | Freq: Every day | ORAL | Status: DC
  Administered 2017-07-18 – 2017-07-19 (×2): 40 mg via ORAL
  Filled 2017-07-17 (×2): qty 1

## 2017-07-17 MED ORDER — IOHEXOL 350 MG/ML SOLN
INTRAVENOUS | Status: DC | PRN
  Administered 2017-07-17: 100 mL via INTRA_ARTERIAL

## 2017-07-17 MED ORDER — TICAGRELOR 90 MG PO TABS
ORAL_TABLET | ORAL | Status: DC | PRN
  Administered 2017-07-17: 180 mg via ORAL

## 2017-07-17 MED ORDER — LIDOCAINE HCL (PF) 1 % IJ SOLN
INTRAMUSCULAR | Status: DC | PRN
  Administered 2017-07-17: 18 mL via SUBCUTANEOUS
  Administered 2017-07-17: 2 mL via SUBCUTANEOUS

## 2017-07-17 MED ORDER — SODIUM CHLORIDE 0.9 % IV SOLN: 250.0000 mL | INTRAVENOUS | Status: DC | PRN

## 2017-07-17 MED ORDER — ASPIRIN 81 MG PO CHEW
324.0000 mg | CHEWABLE_TABLET | Freq: Once | ORAL | Status: AC
  Administered 2017-07-17: 324 mg via ORAL
  Filled 2017-07-17: qty 4

## 2017-07-17 MED ORDER — FENTANYL CITRATE (PF) 100 MCG/2ML IJ SOLN
INTRAMUSCULAR | Status: AC
  Filled 2017-07-17: qty 2

## 2017-07-17 MED ORDER — HYDRALAZINE HCL 20 MG/ML IJ SOLN: 5.0000 mg | INTRAMUSCULAR | Status: DC | PRN

## 2017-07-17 MED ORDER — ASPIRIN 81 MG PO TABS: 81.0000 mg | ORAL_TABLET | Freq: Every day | ORAL | Status: DC

## 2017-07-17 MED ORDER — ATORVASTATIN CALCIUM 80 MG PO TABS
80.0000 mg | ORAL_TABLET | Freq: Every day | ORAL | Status: DC
  Administered 2017-07-18 – 2017-07-21 (×4): 80 mg via ORAL
  Filled 2017-07-17 (×4): qty 1

## 2017-07-17 MED ORDER — BIVALIRUDIN BOLUS VIA INFUSION - CUPID
INTRAVENOUS | Status: DC | PRN
  Administered 2017-07-17: 64.65 mg via INTRAVENOUS

## 2017-07-17 MED ORDER — ACETAMINOPHEN 325 MG PO TABS
650.0000 mg | ORAL_TABLET | ORAL | Status: DC | PRN
  Administered 2017-07-18 – 2017-07-19 (×4): 650 mg via ORAL
  Filled 2017-07-17 (×4): qty 2

## 2017-07-17 MED ORDER — MIDAZOLAM HCL 2 MG/2ML IJ SOLN
INTRAMUSCULAR | Status: DC | PRN
  Administered 2017-07-17: 0.5 mg via INTRAVENOUS

## 2017-07-17 MED ORDER — HEPARIN (PORCINE) IN NACL 1000-0.9 UT/500ML-% IV SOLN
INTRAVENOUS | Status: AC
  Filled 2017-07-17: qty 1000

## 2017-07-17 MED ORDER — ONDANSETRON HCL 4 MG/2ML IJ SOLN
4.0000 mg | Freq: Once | INTRAMUSCULAR | Status: AC
Start: 2017-07-17 — End: 2017-07-17
  Administered 2017-07-17: 4 mg via INTRAVENOUS
  Filled 2017-07-17: qty 2

## 2017-07-17 MED ORDER — SODIUM CHLORIDE 0.9 % IV SOLN
INTRAVENOUS | Status: DC
  Administered 2017-07-17: 21:00:00 via INTRAVENOUS

## 2017-07-17 MED ORDER — SODIUM CHLORIDE 0.9 % IV BOLUS
1000.0000 mL | Freq: Once | INTRAVENOUS | Status: AC
  Administered 2017-07-17: 1000 mL via INTRAVENOUS

## 2017-07-17 MED ORDER — SODIUM CHLORIDE 0.9 % IV SOLN: INTRAVENOUS | Status: DC

## 2017-07-17 MED ORDER — FENTANYL CITRATE (PF) 100 MCG/2ML IJ SOLN
INTRAMUSCULAR | Status: DC | PRN
  Administered 2017-07-17: 25 ug via INTRAVENOUS

## 2017-07-17 MED ORDER — LABETALOL HCL 5 MG/ML IV SOLN: 10.0000 mg | INTRAVENOUS | Status: DC | PRN

## 2017-07-17 MED ORDER — TICAGRELOR 90 MG PO TABS
90.0000 mg | ORAL_TABLET | Freq: Two times a day (BID) | ORAL | Status: DC
  Administered 2017-07-18 – 2017-07-21 (×7): 90 mg via ORAL
  Filled 2017-07-17 (×7): qty 1

## 2017-07-17 MED ORDER — LIDOCAINE HCL (PF) 1 % IJ SOLN
INTRAMUSCULAR | Status: AC
  Filled 2017-07-17: qty 30

## 2017-07-17 MED ORDER — ASPIRIN 81 MG PO CHEW
81.0000 mg | CHEWABLE_TABLET | Freq: Every day | ORAL | Status: DC
  Administered 2017-07-18: 81 mg via ORAL
  Filled 2017-07-17: qty 1

## 2017-07-17 MED ORDER — SODIUM CHLORIDE 0.9% FLUSH: 3.0000 mL | INTRAVENOUS | Status: DC | PRN

## 2017-07-17 MED ORDER — BIVALIRUDIN TRIFLUOROACETATE 250 MG IV SOLR
INTRAVENOUS | Status: AC
  Filled 2017-07-17: qty 250

## 2017-07-17 SURGICAL SUPPLY — 21 items
BALLN SAPPHIRE 2.5X12 (BALLOONS) ×2
BALLOON SAPPHIRE 2.5X12 (BALLOONS) ×1 IMPLANT
CATH INFINITI MULTIPACK ST 5F (CATHETERS) ×2 IMPLANT
CATH LAUNCHER 6FR EBU3.5 (CATHETERS) ×2 IMPLANT
COVER PRB 48X5XTLSCP FOLD TPE (BAG) ×1 IMPLANT
COVER PROBE 5X48 (BAG) ×1
DEVICE WIRE ANGIOSEAL 6FR (Vascular Products) ×2 IMPLANT
GUIDEWIRE INQWIRE 1.5J.035X260 (WIRE) ×2 IMPLANT
INQWIRE 1.5J .035X260CM (WIRE) ×4
KIT ENCORE 26 ADVANTAGE (KITS) ×2 IMPLANT
KIT HEART LEFT (KITS) ×2 IMPLANT
NEEDLE PERC 21GX4CM (NEEDLE) ×2 IMPLANT
PACK CARDIAC CATHETERIZATION (CUSTOM PROCEDURE TRAY) ×2 IMPLANT
SHEATH PINNACLE 6F 10CM (SHEATH) ×2 IMPLANT
SHEATH RAIN RADIAL 21G 6FR (SHEATH) ×2 IMPLANT
STENT SYNERGY DES 3.5X20 (Permanent Stent) ×2 IMPLANT
SYR MEDRAD MARK V 150ML (SYRINGE) ×2 IMPLANT
TRANSDUCER W/STOPCOCK (MISCELLANEOUS) ×2 IMPLANT
TUBING CIL FLEX 10 FLL-RA (TUBING) ×2 IMPLANT
WIRE ASAHI PROWATER 180CM (WIRE) ×2 IMPLANT
WIRE EMERALD 3MM-J .035X150CM (WIRE) ×2 IMPLANT

## 2017-07-17 NOTE — ED Notes (Signed)
Pt here from [facility] with [facility], pt alert and oriented x 4. It was reported that pt was starting to brady down into the 40s in route.

## 2017-07-17 NOTE — ED Notes (Signed)
Cardiology in room 

## 2017-07-17 NOTE — ED Provider Notes (Signed)
[facility] DEPT Provider Note   CSN: [id number] Arrival date & time: 07/08/2017  [place]     History   Chief Complaint Chief Complaint  Patient presents with  . Nausea    HPI [name] is a 84 y.o. female.  [name] is a 84 y.o. Female who presents to the emergency department for evaluation of nausea and vomiting.  According to EMS patient reported she felt nauseous and like she was going to pass out, drove down the street to the fire department, on arrival to the fire department she had an episode of emesis, EMS was called to the scene, they report after emesis she reported she felt better.  And was denying nausea at that time.  On arrival to the ED patient had multiple episodes of emesis while in the room, on my evaluation patient was found in the bathroom, reporting severe substernal chest pain which she describes as a sharp constant pain.  Patient reports pain started when she was vomiting in her room.  She reports she feels weak and like she is going to pass out and does not think she can stand up.  Patient denies associated shortness of breath.  She feels lightheaded, but has not had any syncopal symptoms.  Patient denies any fevers or chills, no cough, reports she did not have any chest pain prior to arriving in the emergency department.  Patient reports history of previous heart disease, review of her chart shows stent placement in 2015, at that time she had an EF of 65 to 70%.     Past Medical History:  Diagnosis Date  . Allergic rhinitis due to other allergen 06/14/2008   Qualifier: Diagnosis of  By: Arnoldo Morale MD, Balinda Quails   . Cellulitis and abscess of neck 03/18/2007   Qualifier: Diagnosis of  By: Arnoldo Morale MD, Balinda Quails   . Diabetes mellitus   . Fibrocystic breast disease   . Hypertension   . Meningioma ([place])   . SEBACEOUS CYST, NECK 02/08/2007   Qualifier: Diagnosis of  By: Arnoldo Morale MD, Balinda Quails   . Tendon tear, ankle     Patient Active  Problem List   Diagnosis Date Noted  . Depression, major, single episode, moderate ([place]) 06/25/2017  . Small vessel stroke ([place]) 12/07/2016  . Mild cognitive impairment 10/31/2015  . Meningioma ([place]) 10/31/2015  . Mixed hyperlipidemia 04/23/2013  . Coronary atherosclerosis of native coronary artery 04/18/2013  . Paroxysmal atrial fibrillation ([place]) 04/18/2013  . NSTEMI (non-ST elevated myocardial infarction) ([place]) 04/16/2013  . Hyperglycemia 01/21/2010  . Osteopenia 06/19/2009  . NECK PAIN 04/30/2008  . MENINGIOMA 09/21/2006  . Essential hypertension 09/21/2006  . FIBROCYSTIC BREAST DISEASE 09/21/2006    Past Surgical History:  Procedure Laterality Date  . BREAST LUMPECTOMY    . CARDIOVERSION N/A 04/18/2013   Procedure: CARDIOVERSION-BEDSIDE;  Surgeon: Laverda Page, MD;  Location: [facility];  Service: Cardiovascular;  Laterality: N/A;  . COLONOSCOPY     05/25/2000. Results: Normal. Hemorrhoids  . LEFT HEART CATHETERIZATION WITH CORONARY ANGIOGRAM N/A 04/17/2013   Procedure: LEFT HEART CATHETERIZATION WITH CORONARY ANGIOGRAM;  Surgeon: Lorretta Harp, MD;  Location: [facility] CATH LAB;  Service: Cardiovascular;  Laterality: N/A;  . PERCUTANEOUS CORONARY STENT INTERVENTION (PCI-S) N/A 04/18/2013   Procedure: PERCUTANEOUS CORONARY STENT INTERVENTION (PCI-S);  Surgeon: Laverda Page, MD;  Location: [facility] CATH LAB;  Service: Cardiovascular;  Laterality: N/A;  . PERCUTANEOUS CORONARY STENT INTERVENTION (PCI-S) N/A 04/19/2013   Procedure: PERCUTANEOUS CORONARY  STENT INTERVENTION (PCI-S);  Surgeon: Laverda Page, MD;  Location: [facility] CATH LAB;  Service: Cardiovascular;  Laterality: N/A;  . tendon tear    . TONSILLECTOMY       OB History   None      Home Medications    Prior to Admission medications   Medication Sig Start Date End Date Taking? Authorizing Provider  apixaban (ELIQUIS) 5 MG TABS tablet Take 1 tablet (5 mg total) by mouth 2 (two) times daily. 06/10/17   Marin Olp,  MD  Ascorbic Acid (VITAMIN C) 100 MG tablet Take 100 mg by mouth daily.    [provider]  aspirin 81 MG tablet Take 81 mg by mouth daily.      [provider]  atenolol (TENORMIN) 100 MG tablet TAKE 1 TABLET BY MOUTH EVERY DAY 10/29/16   Marin Olp, MD  atorvastatin (LIPITOR) 80 MG tablet Take 1 tablet (80 mg total) by mouth daily. 06/10/17   Marin Olp, MD  calcium carbonate (TUMS - DOSED IN MG ELEMENTAL CALCIUM) 500 MG chewable tablet Chew 1 tablet by mouth daily as needed for indigestion or heartburn.    [provider]  donepezil (ARICEPT) 10 MG tablet Take one tablet daily 06/03/17   Cameron Sprang, MD  fluticasone [facility]) 50 MCG/ACT nasal spray Place 2 sprays into both nostrils daily. 06/10/17   Marin Olp, MD  lisinopril (PRINIVIL,ZESTRIL) 10 MG tablet Take 1 tablet (10 mg total) by mouth daily. Increased dose from 5mg  on 06/25/17 06/25/17   Marin Olp, MD  Omega-3 Fatty Acids (FISH OIL) 1000 MG CAPS Take 1 capsule by mouth daily.    [provider]  omeprazole (PRILOSEC) 20 MG capsule Take 1 capsule (20 mg total) by mouth daily. 06/10/17   Marin Olp, MD    Family History Family History  Problem Relation Age of Onset  . Stroke Mother   . Heart disease Father     Social History Social History   Tobacco Use  . Smoking status: Never Smoker  . Smokeless tobacco: Never Used  Substance Use Topics  . Alcohol use: No  . Drug use: No     Allergies   Sulfonamide derivatives   Review of Systems Review of Systems  Unable to perform ROS: Acuity of condition     Physical Exam Updated Vital Signs BP 137/75 (BP Location: Right Arm)   Pulse (!) 54   Temp 97.9 F (36.6 C) (Oral)   Resp 18   SpO2 99%   Physical Exam  Constitutional:  Pt ill-appearing, complaining of constant chest pain  HENT:  Head: Normocephalic and atraumatic.  Eyes: Right eye exhibits no discharge. Left eye exhibits no discharge.    Cardiovascular: Normal rate, regular rhythm, normal heart sounds and intact distal pulses.  Pulmonary/Chest: Effort normal and breath sounds normal. No stridor. No respiratory distress. She has no wheezes. She has no rales.  Abdominal: Soft. Bowel sounds are normal. She exhibits no distension and no mass. There is tenderness. There is no guarding.  Abdomen with mild generalized tenderness without guarding, no peritoneal signs, nondistended and soft with bowel sounds present throughout  Musculoskeletal: She exhibits no edema or deformity.  Neurological: She is alert. Coordination normal.  Skin: Skin is warm and dry. Capillary refill takes less than 2 seconds.  Psychiatric: She has a normal mood and affect. Her behavior is normal.  Nursing note and vitals reviewed.    ED Treatments / Results  Labs (all labs ordered are listed, but only abnormal results are displayed) Labs Reviewed  CBC WITH DIFFERENTIAL/PLATELET  COMPREHENSIVE METABOLIC PANEL  LIPASE, BLOOD  URINALYSIS, ROUTINE W REFLEX MICROSCOPIC  CBC WITH DIFFERENTIAL/PLATELET  PROTIME-INR  APTT  COMPREHENSIVE METABOLIC PANEL  TROPONIN I  LIPID PANEL  I-STAT TROPONIN, ED    EKG None  Radiology No results found.  Procedures .Critical Care Performed by: Jacqlyn Larsen, PA-C Authorized by: Jacqlyn Larsen, PA-C   Critical care provider statement:    Critical care time (minutes):  45   Critical care start time:  07/11/2017 6:15 PM   Critical care end time:  07/26/2017 7:00 PM   Critical care time was exclusive of:  Separately billable procedures and treating other patients   Critical care was necessary to treat or prevent imminent or life-threatening deterioration of the following conditions:  Cardiac failure   Critical care was time spent personally by me on the following activities:  Discussions with consultants, examination of patient, obtaining history from patient or surrogate, review of old charts and ordering and  review of laboratory studies   I assumed direction of critical care for this patient from another provider in my specialty: no     (including critical care time)  Medications Ordered in ED Medications  sodium chloride 0.9 % bolus 1,000 mL (1,000 mLs Intravenous New Bag/Given 07/03/2017 1843)  0.9 %  sodium chloride infusion (has no administration in time range)  ondansetron (ZOFRAN) injection 4 mg (4 mg Intravenous Given 07/01/2017 1842)  fentaNYL (SUBLIMAZE) injection 25 mcg (25 mcg Intravenous Given 07/29/2017 1842)  aspirin tablet 325 mg (325 mg Oral Given 07/05/2017 1842)  aspirin chewable tablet 324 mg (324 mg Oral Given 07/06/2017 1841)  heparin injection 4,000 Units (4,000 Units Intravenous Given 07/22/2017 1844)     Initial Impression / Assessment and Plan / ED Course  I have reviewed the triage vital signs and the nursing notes.  Pertinent labs & imaging results that were available during my care of the patient were reviewed by me and considered in my medical decision making (see chart for details).  Patient presents to the emergency department for nausea and emesis, when I arrived to evaluate the patient she had just had multiple episodes of vomiting, and is now complaining of severe substernal chest pain, chest pain is nonradiating. Vitals stable on arrival with mild bradycardia HR 54. Got pt back into room and got EKG, labs and pain medication ordered.  6:31 PM CODE STEMI activated, ST elevations in V2-V5, patient received Zofran, aspirin, fentanyl and heparin here in the ED, she will be [facility]  Dr. Ashok Cordia spoke with cardiology fellow, they will meet the patient in the emergency room at [facility] and decide if she needs to go to the Cath Lab.  Spoke with Dr. Reather Converse at [facility] ED who is aware of patient  Patient discussed with Dr. Ashok Cordia, who saw patient as well and agrees with plan.   Final Clinical Impressions(s) / ED Diagnoses   Final diagnoses:  ST  elevation myocardial infarction (STEMI), unspecified artery (HCC)  Non-intractable vomiting with nausea, unspecified vomiting type    ED Discharge Orders    None       Janet Berlin 07/12/2017 1916    Lajean Saver, MD 07/01/2017 [phone]

## 2017-07-17 NOTE — H&P (Addendum)
History and Physical  Primary Cardiologist: None (last admitted 2015 saw multiple providers, none in clinic) PCP: Marin Olp, MD  Chief Complaint: Chest Pain  HPI: Patient is a 84 year old female with history of coronary artery disease status post PCI to RCA and LAD 04/2013, paroxysmal atrial fibrillation with infarct noted on MRI imaging on intermittent anticoagulation with Eliquis due to noncompliance, diabetes, hypertension.  She presents to the [facility] long ED with near syncope, emesis, and chest pain.  Patient is a poor historian but likely began this afternoon.  She was emergently transferred from [facility] long to [facility] emergency room where repeat EKG showed anterolateral ST elevations.  She continue have severe chest pain.  She denies bleeding episodes and notes that the last Eliquis does she took was 2 days prior.  Continues to have 7 out of 10 chest pain.  She notes some shortness of breath.  Denies any leg edema.  Otherwise, is a difficult historian other than her severe chest discomfort that she relates.  Regarding her previous past medical history, she was admitted in February 2015 with a non-ST elevation MI cardiac catheterization showed severe two-vessel coronary disease and she underwent PCI to the LAD and RCA.  It does not appear that she return to cardiology clinic for follow-up subsequent to this.  Significant Diagnostic Studies:  04/18/2013: PTCA and stenting of the mid LAD with implantation of a 3.0 x 15 mm Xience Alpine, overlap in the proximal to mid segment with a 3.0 x 38 mm Xience Alpine eluting stent. 04/18/2013: direct current cardioversion, failed to convert from atrial fibrillation to normal sinus rhythm. 2/80/2015: Thrombectomy of the right coronary artery for a thrombotic lesion in the distal RCA, PTCA and stenting of the distal, mid and proximal RCA with implantation of overlapping 3.0 x 38 mm distally and a 3.0 x 28 mm promus Premier drug-eluting  stent. Echocardiogram 2/60/2014: Left ventricle systolic function was dynamic with ejection fraction of 09-81%, grade 1 diastolic dysfunction with elevated LVEDP.  No significant valvular abnormality.   Prior Cardiac Studies:  Past Medical History:  Diagnosis Date  . Allergic rhinitis due to other allergen 06/14/2008   Qualifier: Diagnosis of  By: Arnoldo Morale MD, Balinda Quails   . Cellulitis and abscess of neck 03/18/2007   Qualifier: Diagnosis of  By: Arnoldo Morale MD, Balinda Quails   . Diabetes mellitus   . Fibrocystic breast disease   . Hypertension   . Meningioma ([place])   . SEBACEOUS CYST, NECK 02/08/2007   Qualifier: Diagnosis of  By: Arnoldo Morale MD, Balinda Quails   . Tendon tear, ankle     Past Surgical History:  Procedure Laterality Date  . BREAST LUMPECTOMY    . CARDIOVERSION N/A 04/18/2013   Procedure: CARDIOVERSION-BEDSIDE;  Surgeon: Laverda [place], MD;  Location: [facility];  Service: Cardiovascular;  Laterality: N/A;  . COLONOSCOPY     05/25/2000. Results: Normal. Hemorrhoids  . LEFT HEART CATHETERIZATION WITH CORONARY ANGIOGRAM N/A 04/17/2013   Procedure: LEFT HEART CATHETERIZATION WITH CORONARY ANGIOGRAM;  Surgeon: Lorretta Harp, MD;  Location: [facility] CATH LAB;  Service: Cardiovascular;  Laterality: N/A;  . PERCUTANEOUS CORONARY STENT INTERVENTION (PCI-S) N/A 04/18/2013   Procedure: PERCUTANEOUS CORONARY STENT INTERVENTION (PCI-S);  Surgeon: Laverda [place], MD;  Location: [facility] CATH LAB;  Service: Cardiovascular;  Laterality: N/A;  . PERCUTANEOUS CORONARY STENT INTERVENTION (PCI-S) N/A 04/19/2013   Procedure: PERCUTANEOUS CORONARY STENT INTERVENTION (PCI-S);  Surgeon: Laverda [place], MD;  Location: [facility] CATH LAB;  Service: Cardiovascular;  Laterality: N/A;  . tendon tear    . TONSILLECTOMY      Family History  Problem Relation Age of Onset  . Stroke Mother   . Heart disease Father    Social History:  reports that she has never smoked. She has never used smokeless tobacco. She reports that she does not  drink alcohol or use drugs.  Allergies:  Allergies  Allergen Reactions  . Sulfonamide Derivatives Other (See Comments)    REACTION: childhood unknown type, ended up in hospital    No current facility-administered medications on file prior to encounter.    Current Outpatient Medications on File Prior to Encounter  Medication Sig Dispense Refill  . apixaban (ELIQUIS) 5 MG TABS tablet Take 1 tablet (5 mg total) by mouth 2 (two) times daily. 180 tablet 3  . Ascorbic Acid (VITAMIN C) 100 MG tablet Take 100 mg by mouth daily.    Marland Kitchen aspirin 81 MG tablet Take 81 mg by mouth daily.      Marland Kitchen atenolol (TENORMIN) 100 MG tablet TAKE 1 TABLET BY MOUTH EVERY DAY 90 tablet 2  . atorvastatin (LIPITOR) 80 MG tablet Take 1 tablet (80 mg total) by mouth daily. 90 tablet 3  . calcium carbonate (TUMS - DOSED IN MG ELEMENTAL CALCIUM) 500 MG chewable tablet Chew 1 tablet by mouth daily as needed for indigestion or heartburn.    . donepezil (ARICEPT) 10 MG tablet Take one tablet daily 30 tablet 11  . fluticasone (FLONASE) 50 MCG/ACT nasal spray Place 2 sprays into both nostrils daily. 16 g 3  . lisinopril (PRINIVIL,ZESTRIL) 10 MG tablet Take 1 tablet (10 mg total) by mouth daily. Increased dose from '5mg'$  on 06/25/17 90 tablet 3  . Omega-3 Fatty Acids (FISH OIL) 1000 MG CAPS Take 1 capsule by mouth daily.    Marland Kitchen omeprazole (PRILOSEC) 20 MG capsule Take 1 capsule (20 mg total) by mouth daily. 30 capsule 3    Results for orders placed or performed during the hospital encounter of 07/30/2017 (from the past 48 hour(s))  CBC with Differential     Status: Abnormal   Collection Time: 07/16/2017  6:39 PM  Result Value Ref Range   WBC 18.9 (H) 4.0 - 10.5 K/uL   RBC 5.31 (H) 3.87 - 5.11 MIL/uL   Hemoglobin 14.6 12.0 - 15.0 g/dL   HCT 44.3 36.0 - 46.0 %   MCV 83.4 78.0 - 100.0 fL   MCH 27.5 26.0 - 34.0 pg   MCHC 33.0 30.0 - 36.0 g/dL   RDW 13.2 11.5 - 15.5 %   Platelets 250 150 - 400 K/uL   Neutrophils Relative % 78 %    Neutro Abs 14.7 (H) 1.7 - 7.7 K/uL   Lymphocytes Relative 13 %   Lymphs Abs 2.5 0.7 - 4.0 K/uL   Monocytes Relative 9 %   Monocytes Absolute 1.6 (H) 0.1 - 1.0 K/uL   Eosinophils Relative 0 %   Eosinophils Absolute 0.0 0.0 - 0.7 K/uL   Basophils Relative 0 %   Basophils Absolute 0.0 0.0 - 0.1 K/uL    Comment: Performed at [facility], [place] [address], [place], [place] [address]  Comprehensive metabolic panel     Status: Abnormal   Collection Time: 07/11/2017  6:39 PM  Result Value Ref Range   Sodium 140 135 - 145 mmol/L   Potassium 3.8 3.5 - 5.1 mmol/L   Chloride 102 101 - 111 mmol/L   CO2 23 22 - 32 mmol/L  Glucose, Bld 191 (H) 65 - 99 mg/dL   BUN 14 6 - 20 mg/dL   Creatinine, Ser 0.91 0.44 - 1.00 mg/dL   Calcium 9.2 8.9 - 10.3 mg/dL   Total Protein 6.5 6.5 - 8.1 g/dL   Albumin 3.7 3.5 - 5.0 g/dL   AST 21 15 - 41 U/L   ALT 19 14 - 54 U/L   Alkaline Phosphatase 77 38 - 126 U/L   Total Bilirubin 1.6 (H) 0.3 - 1.2 mg/dL   GFR calc non Af Amer 57 (L) >60 mL/min   GFR calc Af Amer >60 >60 mL/min    Comment: (NOTE) The eGFR has been calculated using the CKD EPI equation. This calculation has not been validated in all clinical situations. eGFR's persistently <60 mL/min signify possible Chronic Kidney Disease.    Anion gap 15 5 - 15    Comment: Performed at [facility], [place] [address] [place], [place] [address]  Lipase, blood     Status: None   Collection Time: 07/28/2017  6:39 PM  Result Value Ref Range   Lipase 34 11 - 51 U/L    Comment: Performed at [facility], [place] [address], [place], [place] [address]  I-stat troponin, ED     Status: None   Collection Time: 07/10/2017  6:39 PM  Result Value Ref Range   Troponin i, poc 0.00 0.00 - 0.08 ng/mL   Comment 3            Comment: Due to the release kinetics of cTnI, a negative result within the first hours of the onset of symptoms does not rule out myocardial infarction  with certainty. If myocardial infarction is still suspected, repeat the test at appropriate intervals.    Dg Chest Portable 1 View  Result Date: 07/24/2017 CLINICAL DATA:  Chest pain EXAM: PORTABLE CHEST 1 VIEW COMPARISON:  11/30/2014 FINDINGS: Cardiomegaly. No confluent airspace opacities, effusions or edema. No acute bony abnormality. IMPRESSION: Cardiomegaly.  No active disease. Electronically Signed   By: Rolm Baptise M.D.   On: 07/01/2017 18:42    ECG/Tele: Normal sinus rhythm with anterolateral ST elevations and reciprocal inferior depressions.  ROS: As above. Otherwise, review of systems is negative unless per above HPI  Vitals:   07/05/2017 1835 07/22/2017 1902 07/27/2017 1908 07/04/2017 1925  BP:  (!) 95/58 (!) 108/51   Pulse:   (!) 57   Resp:   20   Temp:      TempSrc:      SpO2:   100% 99%  Weight: 86.2 kg (190 lb)      Wt Readings from Last 10 Encounters:  07/13/2017 86.2 kg (190 lb)  06/25/17 85.7 kg (189 lb)  06/10/17 85.6 kg (188 lb 12.8 oz)  06/03/17 85.3 kg (188 lb)  12/07/16 83 kg (183 lb)  08/13/16 82.8 kg (182 lb 9.6 oz)  06/11/16 84.4 kg (186 lb)  05/26/16 85.1 kg (187 lb 9.6 oz)  12/17/15 84.2 kg (185 lb 9.6 oz)  10/29/15 83.9 kg (185 lb)    PE:  General: Mild discomfort HEENT: Atraumatic, EOMI, mucous membranes moist. No JVD at 45 degrees. No HJR. CV: RRR no murmurs, gallops.  Respiratory: Clear, no crackles. Normal work of breathing ABD: Obese, non-tender. No palpable organomegaly.  Extremities: 1+ radial pulses bilaterally. 1+ edema bilateral LE. Neuro/Psych: CN grossly intact, confused at times  Assessment/Plan STEMI Paroxysmal AF with remote infarct noted on brain MRI HTN Memory Loss/Medication Non-Compliance HLD  PLAN: - S/P 325 ASA, IV UF Heparin - Emergent LHC - Atorva 80 mg - Plan for triple versus double therapy pending LHC results - Compliance and outpatient follow-up will need to be stressed - Left message with daughter Wannetta Sender -  [phone])  Lolita Cram Means  MD 07/02/2017, 7:30 PM   I have examined the patient and reviewed assessment and plan and discussed with patient.  Agree with above as stated.  I reviewed the ECG and made the decision for the patient to come emergently to cath.    She had an occluded LAD which was stented.  SHe has dementia which affected her ability to sit still, and she showed clear memory issues.  There was residual diagonal disease which will be managed medically. Angioseal was placed for hemostasis since she was unlikely to keep her leg still.  I spoke with the nurse post cath and ok'ed a knee immobilizer if needed.  A message was left for the patient's daughter but we have been unable to reach her.    It appears there is a family member of the patient who saw Dr. Einar Gip in the past and the patient had her PCI by Dr. Einar Gip in 2015.  She had also seen Dr. Radford Pax and Dr. Gwenlyn Found in the past.  No outpatient f/u is documented in Epic, so unassigned cardiology was called for the STEMI.  Need to discuss with the patient with whom they would like to followup.  Longterm anticoagulation will also need to be discussed as she has been noncompliant with her Eliquis, which was prescribed by her PMD.   Larae Grooms

## 2017-07-17 NOTE — ED Triage Notes (Signed)
Pt arrived via EMS from home. Pt felt as if she was going to pass out  and drove down to the fire dept. Pt had an episode of emesis and since pt reports to EMS that she felt better. Pt denies nausea at this time.  EMS v/s 162/100 HR 60 RR 18. CBG 113, O2 100%  18G Rt AC.

## 2017-07-17 NOTE — ED Provider Notes (Signed)
[facility] EMERGENCY DEPARTMENT Provider Note   CSN: [id number] Arrival date & time: 07/24/2017  [place]     History   Chief Complaint Chief Complaint  Patient presents with  . Nausea    HPI [name] is a 84 y.o. female.  Patient with history of heart attack on Eliquis and aspirin, htn, presents from outside hospital for possible heart attack. Patient describes earlier today feeling nauseated generally not well minimal in the ED started to develop chest pressure. Currently patient is moderate pain. No focal radiation. Difficulty getting details due to acuity of presentation. Patient received heparinon route.     Past Medical History:  Diagnosis Date  . Allergic rhinitis due to other allergen 06/14/2008   Qualifier: Diagnosis of  By: Arnoldo Morale MD, Balinda Quails   . Cellulitis and abscess of neck 03/18/2007   Qualifier: Diagnosis of  By: Arnoldo Morale MD, Balinda Quails   . Diabetes mellitus   . Fibrocystic breast disease   . Hypertension   . Meningioma ([place])   . SEBACEOUS CYST, NECK 02/08/2007   Qualifier: Diagnosis of  By: Arnoldo Morale MD, Balinda Quails   . Tendon tear, ankle     Patient Active Problem List   Diagnosis Date Noted  . Depression, major, single episode, moderate ([place]) 06/25/2017  . Small vessel stroke ([place]) 12/07/2016  . Mild cognitive impairment 10/31/2015  . Meningioma ([place]) 10/31/2015  . Mixed hyperlipidemia 04/23/2013  . Coronary atherosclerosis of native coronary artery 04/18/2013  . Paroxysmal atrial fibrillation ([place]) 04/18/2013  . NSTEMI (non-ST elevated myocardial infarction) ([place]) 04/16/2013  . Hyperglycemia 01/21/2010  . Osteopenia 06/19/2009  . NECK PAIN 04/30/2008  . MENINGIOMA 09/21/2006  . Essential hypertension 09/21/2006  . FIBROCYSTIC BREAST DISEASE 09/21/2006    Past Surgical History:  Procedure Laterality Date  . BREAST LUMPECTOMY    . CARDIOVERSION N/A 04/18/2013   Procedure: CARDIOVERSION-BEDSIDE;  Surgeon: Laverda [place], MD;   Location: [facility];  Service: Cardiovascular;  Laterality: N/A;  . COLONOSCOPY     05/25/2000. Results: Normal. Hemorrhoids  . LEFT HEART CATHETERIZATION WITH CORONARY ANGIOGRAM N/A 04/17/2013   Procedure: LEFT HEART CATHETERIZATION WITH CORONARY ANGIOGRAM;  Surgeon: Lorretta Harp, MD;  Location[facility] CATH LAB;  Service: Cardiovascular;  Laterality: N/A;  . PERCUTANEOUS CORONARY STENT INTERVENTION (PCI-S) N/A 04/18/2013   Procedure: PERCUTANEOUS CORONARY STENT INTERVENTION (PCI-S);  Surgeon: Laverda [place], MD;  Location: [facility] CATH LAB;  Service: Cardiovascular;  Laterality: N/A;  . PERCUTANEOUS CORONARY STENT INTERVENTION (PCI-S) N/A 04/19/2013   Procedure: PERCUTANEOUS CORONARY STENT INTERVENTION (PCI-S);  Surgeon: Laverda [place], MD;  Location: [facility] CATH LAB;  Service: Cardiovascular;  Laterality: N/A;  . tendon tear    . TONSILLECTOMY       OB History   None      Home Medications    Prior to Admission medications   Medication Sig Start Date End Date Taking? Authorizing Provider  apixaban (ELIQUIS) 5 MG TABS tablet Take 1 tablet (5 mg total) by mouth 2 (two) times daily. 06/10/17   Marin Olp, MD  Ascorbic Acid (VITAMIN C) 100 MG tablet Take 100 mg by mouth daily.    [provider]  aspirin 81 MG tablet Take 81 mg by mouth daily.      [provider]  atenolol (TENORMIN) 100 MG tablet TAKE 1 TABLET BY MOUTH EVERY DAY 10/29/16   Marin Olp, MD  atorvastatin (LIPITOR) 80 MG tablet Take 1 tablet (80 mg total) by mouth  daily. 06/10/17   Marin Olp, MD  calcium carbonate (TUMS - DOSED IN MG ELEMENTAL CALCIUM) 500 MG chewable tablet Chew 1 tablet by mouth daily as needed for indigestion or heartburn.    [provider]  donepezil (ARICEPT) 10 MG tablet Take one tablet daily 06/03/17   Cameron Sprang, MD  fluticasone [facility]) 50 MCG/ACT nasal spray Place 2 sprays into both nostrils daily. 06/10/17   Marin Olp, MD  lisinopril  (PRINIVIL,ZESTRIL) 10 MG tablet Take 1 tablet (10 mg total) by mouth daily. Increased dose from 5mg  on 06/25/17 06/25/17   Marin Olp, MD  Omega-3 Fatty Acids (FISH OIL) 1000 MG CAPS Take 1 capsule by mouth daily.    [provider]  omeprazole (PRILOSEC) 20 MG capsule Take 1 capsule (20 mg total) by mouth daily. 06/10/17   Marin Olp, MD    Family History Family History  Problem Relation Age of Onset  . Stroke Mother   . Heart disease Father     Social History Social History   Tobacco Use  . Smoking status: Never Smoker  . Smokeless tobacco: Never Used  Substance Use Topics  . Alcohol use: No  . Drug use: No     Allergies   Sulfonamide derivatives   Review of Systems Review of Systems  Constitutional: Positive for appetite change. Negative for chills and fever.  HENT: Negative for congestion.   Eyes: Negative for visual disturbance.  Respiratory: Negative for shortness of breath.   Cardiovascular: Positive for chest pain.  Gastrointestinal: Positive for nausea and vomiting. Negative for abdominal pain.  Genitourinary: Negative for dysuria and flank pain.  Musculoskeletal: Negative for back pain, neck pain and neck stiffness.  Skin: Negative for rash.  Neurological: Negative for light-headedness and headaches.     Physical Exam Updated Vital Signs BP 137/75 (BP Location: Right Arm)   Pulse (!) 54   Temp 97.9 F (36.6 C) (Oral)   Resp 18   Wt 86.2 kg (190 lb)   SpO2 99%   BMI 33.66 kg/m   Physical Exam  Constitutional: [name] is oriented to person, place, and time. [name] appears well-developed. [name] appears distressed.  HENT:  Head: Normocephalic and atraumatic.  Eyes: Conjunctivae are normal. Right eye exhibits no discharge. Left eye exhibits no discharge.  Neck: Normal range of motion. Neck supple. No tracheal deviation present.  Cardiovascular: Normal rate and regular rhythm.  Pulmonary/Chest: Effort normal. [name] has rales (few sparse rales  basis).  Abdominal: Soft. [name] exhibits no distension. There is no tenderness. There is no guarding.  Musculoskeletal: [name] exhibits no edema.  Neurological: [name] is alert and oriented to person, place, and time.  Skin: Skin is warm. There is pallor.  Psychiatric:  Tired appearing  Nursing note and vitals reviewed.    ED Treatments / Results  Labs (all labs ordered are listed, but only abnormal results are displayed) Labs Reviewed  CBC WITH DIFFERENTIAL/PLATELET - Abnormal; Notable for the following components:      Result Value   WBC 18.9 (*)    RBC 5.31 (*)    Neutro Abs 14.7 (*)    Monocytes Absolute 1.6 (*)    All other components within normal limits  COMPREHENSIVE METABOLIC PANEL - Abnormal; Notable for the following components:   Glucose, Bld 191 (*)    Total Bilirubin 1.6 (*)    GFR calc non Af Amer 57 (*)    All other components within normal limits  LIPASE, BLOOD  URINALYSIS, ROUTINE W REFLEX MICROSCOPIC  CBC WITH DIFFERENTIAL/PLATELET  PROTIME-INR  APTT  COMPREHENSIVE METABOLIC PANEL  TROPONIN I  LIPID PANEL  I-STAT TROPONIN, ED  I-STAT TROPONIN, ED  I-STAT CG4 LACTIC ACID, ED    EKG EKG Interpretation  Date/Time:  Saturday Jul 17 2017 18:24:24 EDT Ventricular Rate:  52 PR Interval:    QRS Duration: 94 QT Interval:  464 QTC Calculation: 432 R Axis:   18 Text Interpretation:  Sinus rhythm Baseline wander `st elev AVL,  v2-v4, appearance changed from prior, possible stemi.  Confirmed by Lajean Saver [phone]) on 07/26/2017 6:44:38 PM   Radiology Dg Chest Portable 1 View  Result Date: 07/16/2017 CLINICAL DATA:  Chest pain EXAM: PORTABLE CHEST 1 VIEW COMPARISON:  11/30/2014 FINDINGS: Cardiomegaly. No confluent airspace opacities, effusions or edema. No acute bony abnormality. IMPRESSION: Cardiomegaly.  No active disease. Electronically Signed   By: Rolm Baptise M.D.   On: 07/08/2017 18:42    Procedures .Critical Care Performed by: Elnora Morrison,  MD Authorized by: Elnora Morrison, MD   Critical care provider statement:    Critical care time (minutes):  20   Critical care start time:  07/07/2017 6:55 PM   Critical care end time:  07/13/2017 7:15 PM   Critical care time was exclusive of:  Separately billable procedures and treating other patients and teaching time   Critical care was necessary to treat or prevent imminent or life-threatening deterioration of the following conditions:  Cardiac failure   Critical care was time spent personally by me on the following activities:  Ordering and review of laboratory studies, ordering and review of radiographic studies, discussions with consultants, evaluation of patient's response to treatment and examination of patient   I assumed direction of critical care for this patient from another provider in my specialty: no     (including critical care time)  Medications Ordered in ED Medications  sodium chloride 0.9 % bolus 1,000 mL (1,000 mLs Intravenous New Bag/Given 07/04/2017 1843)  0.9 %  sodium chloride infusion (has no administration in time range)  0.9 %  sodium chloride infusion (has no administration in time range)  ondansetron (ZOFRAN) injection 4 mg (4 mg Intravenous Given 07/30/2017 1842)  fentaNYL (SUBLIMAZE) injection 25 mcg (25 mcg Intravenous Given 07/22/2017 1842)  aspirin tablet 325 mg (325 mg Oral Given 07/28/2017 1842)  aspirin chewable tablet 324 mg (324 mg Oral Given 07/08/2017 1841)  heparin injection 4,000 Units (4,000 Units Intravenous Given 07/05/2017 1844)     Initial Impression / Assessment and Plan / ED Course  I have reviewed the triage vital signs and the nursing notes.  Pertinent labs & imaging results that were available during my care of the patient were reviewed by me and considered in my medical decision making (see chart for details).    Patient presents as transfer for concern for myocardial infarction.Patient nauseated, generally fatigued appearance and significant chest  pressure on arrival. Repeat EKG consistent with lateral myocardial infarction ST elevation. Repeat cardiology to update on patient's arrival. Plan for cath lab when available. Patient received heparin already.Repeat troponin sent.Mild fluids given.Patient has mild bradycardia will monitor closely pads placed.  The patients results and plan were reviewed and discussed.   Any x-rays performed were independently reviewed by myself.   Differential diagnosis were considered with the presenting HPI.  Medications  sodium chloride 0.9 % bolus 1,000 mL (1,000 mLs Intravenous New Bag/Given 07/13/2017 1843)  0.9 %  sodium chloride infusion (has no administration in time  range)  0.9 %  sodium chloride infusion (has no administration in time range)  ondansetron (ZOFRAN) injection 4 mg (4 mg Intravenous Given 07/02/2017 1842)  fentaNYL (SUBLIMAZE) injection 25 mcg (25 mcg Intravenous Given 07/08/2017 1842)  aspirin tablet 325 mg (325 mg Oral Given 07/22/2017 1842)  aspirin chewable tablet 324 mg (324 mg Oral Given 07/30/2017 1841)  heparin injection 4,000 Units (4,000 Units Intravenous Given 07/27/2017 1844)    Vitals:   07/19/2017 1735 07/27/2017 1835 07/28/2017 1902 07/19/2017 1908  BP: 137/75  (!) 95/58 (!) 108/51  Pulse: (!) 54   (!) 57  Resp: 18   20  Temp: 97.9 F (36.6 C)     TempSrc: Oral     SpO2: 99%   100%  Weight:  86.2 kg (190 lb)      Final diagnoses:  ST elevation myocardial infarction (STEMI), unspecified artery (HCC)  Non-intractable vomiting with nausea, unspecified vomiting type    Admission/ observation were discussed with the admitting physician, patient and/or family and they are comfortable with the plan.    Final Clinical Impressions(s) / ED Diagnoses   Final diagnoses:  ST elevation myocardial infarction (STEMI), unspecified artery (HCC)  Non-intractable vomiting with nausea, unspecified vomiting type    ED Discharge Orders    None       Elnora Morrison, MD 07/22/2017 1916

## 2017-07-17 NOTE — ED Notes (Signed)
Bed: LK44 Expected date:  Expected time:  Means of arrival:  Comments: 84 yo near syncope, emesis

## 2017-07-18 ENCOUNTER — Inpatient Hospital Stay (HOSPITAL_COMMUNITY): Payer: Medicare HMO

## 2017-07-18 ENCOUNTER — Encounter (HOSPITAL_COMMUNITY): Admission: EM | Disposition: E | Payer: Self-pay | Source: Home / Self Care | Attending: Cardiology

## 2017-07-18 DIAGNOSIS — I25110 Atherosclerotic heart disease of native coronary artery with unstable angina pectoris: Secondary | ICD-10-CM | POA: Diagnosis not present

## 2017-07-18 DIAGNOSIS — I469 Cardiac arrest, cause unspecified: Secondary | ICD-10-CM | POA: Diagnosis not present

## 2017-07-18 HISTORY — PX: LEFT HEART CATH AND CORONARY ANGIOGRAPHY: CATH118249

## 2017-07-18 LAB — CBC WITH DIFFERENTIAL/PLATELET
BASOS PCT: 0 %
Basophils Absolute: 0 10*3/uL (ref 0.0–0.1)
EOS ABS: 0 10*3/uL (ref 0.0–0.7)
EOS PCT: 0 %
HCT: 43.8 % (ref 36.0–46.0)
Hemoglobin: 14 g/dL (ref 12.0–15.0)
LYMPHS PCT: 15 %
Lymphs Abs: 2.8 10*3/uL (ref 0.7–4.0)
MCH: 27.2 pg (ref 26.0–34.0)
MCHC: 32 g/dL (ref 30.0–36.0)
MCV: 85 fL (ref 78.0–100.0)
Monocytes Absolute: 0.9 10*3/uL (ref 0.1–1.0)
Monocytes Relative: 5 %
NEUTROS ABS: 15 10*3/uL — AB (ref 1.7–7.7)
NEUTROS PCT: 80 %
PLATELETS: 267 10*3/uL (ref 150–400)
RBC: 5.15 MIL/uL — AB (ref 3.87–5.11)
RDW: 13.2 % (ref 11.5–15.5)
WBC: 18.7 10*3/uL — ABNORMAL HIGH (ref 4.0–10.5)

## 2017-07-18 LAB — BASIC METABOLIC PANEL
Anion gap: 8 (ref 5–15)
Anion gap: 12 (ref 5–15)
BUN: 12 mg/dL (ref 6–20)
BUN: 15 mg/dL (ref 6–20)
CALCIUM: 9 mg/dL (ref 8.9–10.3)
CHLORIDE: 108 mmol/L (ref 101–111)
CO2: 23 mmol/L (ref 22–32)
CO2: 22 mmol/L (ref 22–32)
CREATININE: 0.94 mg/dL (ref 0.44–1.00)
Calcium: 8.4 mg/dL — ABNORMAL LOW (ref 8.9–10.3)
Chloride: 104 mmol/L (ref 101–111)
Creatinine, Ser: 0.84 mg/dL (ref 0.44–1.00)
GFR calc non Af Amer: >60 mL/min (ref >60)
GFR calc non Af Amer: 55 mL/min — ABNORMAL LOW (ref >60)
GLUCOSE: 172 mg/dL — AB (ref 65–99)
Glucose, Bld: 165 mg/dL — ABNORMAL HIGH (ref 65–99)
Potassium: 4.5 mmol/L (ref 3.5–5.1)
Potassium: 3.8 mmol/L (ref 3.5–5.1)
SODIUM: 138 mmol/L (ref 135–145)
Sodium: 139 mmol/L (ref 135–145)

## 2017-07-18 LAB — POCT I-STAT 3, ART BLOOD GAS (G3+)
ACID-BASE DEFICIT: 3 mmol/L — AB (ref 0.0–2.0)
Bicarbonate: 22.3 mmol/L (ref 20.0–28.0)
O2 SAT: 100 %
PH ART: 7.358 (ref 7.350–7.450)
PO2 ART: 267 mmHg — AB (ref 83.0–108.0)
TCO2: 23 mmol/L (ref 22–32)
pCO2 arterial: 39.6 mmHg (ref 32.0–48.0)

## 2017-07-18 LAB — CBC
HEMATOCRIT: 39.8 % (ref 36.0–46.0)
Hemoglobin: 12.8 g/dL (ref 12.0–15.0)
MCH: 27.3 pg (ref 26.0–34.0)
MCHC: 32.2 g/dL (ref 30.0–36.0)
MCV: 84.9 fL (ref 78.0–100.0)
Platelets: 238 10*3/uL (ref 150–400)
RBC: 4.69 MIL/uL (ref 3.87–5.11)
RDW: 13 % (ref 11.5–15.5)
WBC: 12.4 10*3/uL — ABNORMAL HIGH (ref 4.0–10.5)

## 2017-07-18 LAB — MAGNESIUM: MAGNESIUM: 2 mg/dL (ref 1.7–2.4)

## 2017-07-18 LAB — HEMOGLOBIN A1C
Hgb A1c MFr Bld: 5.9 % — ABNORMAL HIGH (ref 4.8–5.6)
Mean Plasma Glucose: 122.63 mg/dL

## 2017-07-18 LAB — GLUCOSE, CAPILLARY: GLUCOSE-CAPILLARY: 189 mg/dL — AB (ref 65–99)

## 2017-07-18 LAB — LIPID PANEL
CHOL/HDL RATIO: 5.6 ratio
Cholesterol: 224 mg/dL — ABNORMAL HIGH (ref 0–200)
HDL: 40 mg/dL — ABNORMAL LOW (ref >40)
LDL CALC: 165 mg/dL — AB (ref 0–99)
TRIGLYCERIDES: 95 mg/dL (ref <150)
VLDL: 19 mg/dL (ref 0–40)

## 2017-07-18 LAB — TROPONIN I
Troponin I: >65 ng/mL (ref <0.03)
Troponin I: >65 ng/mL (ref <0.03)
Troponin I: >65 ng/mL (ref <0.03)

## 2017-07-18 LAB — PHOSPHORUS: Phosphorus: 2.5 mg/dL (ref 2.5–4.6)

## 2017-07-18 SURGERY — LEFT HEART CATH AND CORONARY ANGIOGRAPHY
Anesthesia: LOCAL

## 2017-07-18 MED ORDER — ALPRAZOLAM 0.5 MG PO TABS
1.0000 mg | ORAL_TABLET | Freq: Two times a day (BID) | ORAL | Status: DC | PRN
  Administered 2017-07-19: 0.5 mg via ORAL
  Administered 2017-07-19: 1 mg via ORAL
  Filled 2017-07-18 (×2): qty 2

## 2017-07-18 MED ORDER — LIDOCAINE HCL (PF) 1 % IJ SOLN
INTRAMUSCULAR | Status: AC
  Filled 2017-07-18: qty 30

## 2017-07-18 MED ORDER — AMIODARONE HCL IN DEXTROSE 360-4.14 MG/200ML-% IV SOLN
30.0000 mg/h | INTRAVENOUS | Status: DC
  Administered 2017-07-19: 30 mg/h via INTRAVENOUS
  Filled 2017-07-18 (×2): qty 200

## 2017-07-18 MED ORDER — ONDANSETRON HCL 4 MG/2ML IJ SOLN
4.0000 mg | Freq: Four times a day (QID) | INTRAMUSCULAR | Status: DC | PRN
  Administered 2017-07-19: 4 mg via INTRAVENOUS
  Filled 2017-07-18: qty 2

## 2017-07-18 MED ORDER — SODIUM CHLORIDE 0.9 % IV SOLN
250.0000 mL | INTRAVENOUS | Status: DC | PRN
  Administered 2017-07-20: 08:00:00 via INTRAVENOUS

## 2017-07-18 MED ORDER — IOHEXOL 350 MG/ML SOLN
INTRAVENOUS | Status: DC | PRN
  Administered 2017-07-18: 50 mL via INTRA_ARTERIAL

## 2017-07-18 MED ORDER — METOPROLOL TARTRATE 5 MG/5ML IV SOLN
INTRAVENOUS | Status: AC
  Administered 2017-07-18: 5 mg
  Filled 2017-07-18: qty 5

## 2017-07-18 MED ORDER — SODIUM CHLORIDE 0.9% FLUSH: 3.0000 mL | INTRAVENOUS | Status: DC | PRN

## 2017-07-18 MED ORDER — APIXABAN 5 MG PO TABS
5.0000 mg | ORAL_TABLET | Freq: Two times a day (BID) | ORAL | Status: DC
  Administered 2017-07-19 (×3): 5 mg via ORAL
  Filled 2017-07-18 (×3): qty 1

## 2017-07-18 MED ORDER — AMIODARONE HCL IN DEXTROSE 360-4.14 MG/200ML-% IV SOLN
INTRAVENOUS | Status: AC
  Filled 2017-07-18: qty 200

## 2017-07-18 MED ORDER — SODIUM CHLORIDE 0.9% FLUSH
3.0000 mL | Freq: Two times a day (BID) | INTRAVENOUS | Status: DC
  Administered 2017-07-19 – 2017-07-24 (×9): 3 mL via INTRAVENOUS

## 2017-07-18 MED ORDER — AMIODARONE LOAD VIA INFUSION
150.0000 mg | Freq: Once | INTRAVENOUS | Status: DC
  Filled 2017-07-18: qty 83.34

## 2017-07-18 MED ORDER — HEPARIN (PORCINE) IN NACL 1000-0.9 UT/500ML-% IV SOLN
INTRAVENOUS | Status: AC
  Filled 2017-07-18: qty 1000

## 2017-07-18 MED ORDER — LIDOCAINE HCL (PF) 1 % IJ SOLN
INTRAMUSCULAR | Status: DC | PRN
  Administered 2017-07-18: 20 mL

## 2017-07-18 MED ORDER — SODIUM CHLORIDE 0.9 % IV SOLN: INTRAVENOUS | Status: AC

## 2017-07-18 MED ORDER — MORPHINE SULFATE (PF) 4 MG/ML IV SOLN
4.0000 mg | Freq: Once | INTRAVENOUS | Status: DC
  Administered 2017-07-18: 4 mg via INTRAVENOUS

## 2017-07-18 MED ORDER — NITROGLYCERIN 1 MG/10 ML FOR IR/CATH LAB
INTRA_ARTERIAL | Status: AC
Start: 2017-07-18 — End: ?
  Filled 2017-07-18: qty 10

## 2017-07-18 MED ORDER — HEPARIN (PORCINE) IN NACL 2-0.9 UNITS/ML
INTRAMUSCULAR | Status: AC | PRN
  Administered 2017-07-18 (×2): 500 mL via INTRA_ARTERIAL
  Administered 2017-07-18: 500 mL

## 2017-07-18 MED ORDER — ATENOLOL 25 MG PO TABS
25.0000 mg | ORAL_TABLET | Freq: Two times a day (BID) | ORAL | Status: DC
  Administered 2017-07-19: 25 mg via ORAL
  Filled 2017-07-18: qty 1

## 2017-07-18 MED ORDER — AMIODARONE HCL IN DEXTROSE 360-4.14 MG/200ML-% IV SOLN
60.0000 mg/h | INTRAVENOUS | Status: DC
  Administered 2017-07-18 – 2017-07-19 (×3): 60 mg/h via INTRAVENOUS
  Filled 2017-07-18: qty 200

## 2017-07-18 SURGICAL SUPPLY — 9 items
CATH INFINITI MULTIPACK ST 5F (CATHETERS) ×2 IMPLANT
DEVICE CLOSURE MYNXGRIP 6/7F (Vascular Products) ×2 IMPLANT
KIT ENCORE 26 ADVANTAGE (KITS) ×4 IMPLANT
KIT HEART LEFT (KITS) ×2 IMPLANT
KIT MICROPUNCTURE NIT STIFF (SHEATH) ×2 IMPLANT
PACK CARDIAC CATHETERIZATION (CUSTOM PROCEDURE TRAY) ×2 IMPLANT
SHEATH PINNACLE 6F 10CM (SHEATH) ×2 IMPLANT
TRANSDUCER W/STOPCOCK (MISCELLANEOUS) ×2 IMPLANT
WIRE EMERALD 3MM-J .035X150CM (WIRE) ×2 IMPLANT

## 2017-07-18 NOTE — Progress Notes (Signed)
Patient had an episode of VF arrest leading to defibrillation.  She then developed sustained VT symptomatic, with transient loss of consciousness leading defibrillation again, x3.  She returned to spontaneous ROS she is presently alert and oriented x3, complains of back pain.  No chest pain.  Vitals:   07/05/2017 1930 07/09/2017 2000  BP:  119/68  Pulse: 69 75  Resp: 19 19  Temp:    SpO2: 91% 93%   Physical Exam  Constitutional: She is oriented to person, place, and time and well-developed, well-nourished, and in no distress. No distress.  Appears anxious  HENT:  Head: Atraumatic.  Eyes: Conjunctivae are normal.  Neck: Neck supple.  Cardiovascular:  Tachycardic, S1 variable, S2 normal, no murmur appreciated.  Pulmonary/Chest: Effort normal and breath sounds normal.  Abdominal: Soft.  Musculoskeletal: Normal range of motion.  Neurological: She is alert and oriented to person, place, and time.  Skin: Skin is warm and dry.   EKG 07/09/2017: A. fib with RVR at the rate of 130 bpm, ST elevation in 1 and aVL and V3 to V6, new since EKG performed earlier this morning patient was in sinus rhythm with poor R wave progression and lateral T wave inversion.  Impression:  1. Cardiac arrest secondary to VF and VT 2. Acute STEMI anterolateral wall S/P stenting of proximal LAD with 3.5 x 20 mm Synergy.  History of non-STEMI and PTCA with overlapping stents on 04/18/2013 with implantation of a 3.0 x 15 mm and 3.0 x 38 mm Xience DES in the proximal and mid LAD and staged intervention to the right coronary artery with implantation of a 3.0 x 38 mm x 2 DES. 3.  Paroxysmal A. fib, low back in A. fib with RVR. CHA2DS2-VASCScore: Risk Score  8.  Recommendation: Patient has new EKG abnormality, cannot exclude acute stent thrombosis with new ST elevation and VF arrest and also VT needing defibrillation.  We will take her back to the cardiac catheterization lab to evaluate her coronary anatomy on emergent basis.   Patient's family, her daughter and son present. Mg and BMP sent. Will monitor.   Adrian Prows, MD 07/23/2017, 9:47 PM [facility]. [place] Pager: [phone] Office: [phone] If no answer: Cell:  [phone]

## 2017-07-18 NOTE — Progress Notes (Signed)
Progress Note  Patient Name: [name] Date of Encounter: 07/03/2017  Primary Cardiologist: Lavone Nian  Primary Electrophysiologist:  na   Patient Profile      84 y.o. female admitted 5/18 with chest pain and anterior STEMI.  LAD occluded and was stented.  Has an antecedent history of coronary artery disease having had her LAD stented 2015 thrombectomy 2015 and RCA stenting  History of atrial fibrillation with prior stroke on intermittent anticoagulation.  Thromboembolic risk factors ( age  -2, HTN-1, TIA/CVA-2, DM-1, Vasc disease -1, Gender-1) for a CHADSVASc Score of 8   Spoke with patient and daughter.  Dr. Einar Gip is her cardiologist.   Subjective   Without chest pain or shortness of breath.  Not urinating.  Inpatient Medications    Scheduled Meds: . aspirin  81 mg Oral Daily  . atenolol  12.5 mg Oral Daily  . atorvastatin  80 mg Oral Daily  . donepezil  10 mg Oral QHS  . fluticasone  2 spray Each Nare Daily  . pantoprazole  40 mg Oral Daily  . sodium chloride flush  3 mL Intravenous Q12H  . ticagrelor  90 mg Oral BID   Continuous Infusions: . sodium chloride 20 mL/hr at 07/16/2017 0700  . sodium chloride 125 mL/hr at 07/22/2017 0300  . sodium chloride     PRN Meds: sodium chloride, acetaminophen, morphine injection, ondansetron (ZOFRAN) IV, sodium chloride flush   Vital Signs    Vitals:   07/22/2017 0630 07/01/2017 0700 07/30/2017 0730 07/24/2017 0800  BP: (!) 111/46 (!) 117/52 111/62 (!) 107/56  Pulse: 62 (!) 59 (!) 56 61  Resp: 19 16 15 17   Temp:    (!) 97.4 F (36.3 C)  TempSrc:    Oral  SpO2: 98% 96% 98% 94%  Weight:        Intake/Output Summary (Last 24 hours) at 07/15/2017 1007 Last data filed at 07/13/2017 0300 Gross per 24 hour  Intake 770.83 ml  Output -  Net 770.83 ml   Filed Weights   07/01/2017 1835 07/04/2017 2030  Weight: 190 lb (86.2 kg) 187 lb 13.3 oz (85.2 kg)    Telemetry    Sinus rhythm- Personally Reviewed  ECG    Low voltage.   Poor R wave progression personally Reviewed  Physical Exam   GEN: No acute distress.   Neck: JVD flat Cardiac: RRR, no  murmurs, rubs, or gallops.  Respiratory: Crackles B GI: Soft, nontender, non-distended  MS:   edema; No deformity. Neuro:  Nonfocal  Psych: Normal affect  Skin Warm and dry   Labs    Chemistry Recent Labs  Lab 07/30/2017 1839 07/08/2017 0311  NA 140 139  K 3.8 4.5  CL 102 108  CO2 23 23  GLUCOSE 191* 172*  BUN 14 12  CREATININE 0.91 0.84  CALCIUM 9.2 8.4*  PROT 6.5  --   ALBUMIN 3.7  --   AST 21  --   ALT 19  --   ALKPHOS 77  --   BILITOT 1.6*  --   GFRNONAA 57* >60  GFRAA >60 >60  ANIONGAP 15 8     Hematology Recent Labs  Lab 07/29/2017 1839 07/29/2017 0311  WBC 18.9* 12.4*  RBC 5.31* 4.69  HGB 14.6 12.8  HCT 44.3 39.8  MCV 83.4 84.9  MCH 27.5 27.3  MCHC 33.0 32.2  RDW 13.2 13.0  PLT 250 238    Cardiac Enzymes Recent Labs  Lab 07/16/2017 2132 07/24/2017 0311  TROPONINI 60.81* >65.00*    Recent Labs  Lab 07/28/2017 1839  TROPIPOC 0.00     BNPNo results for input(s): BNP, PROBNP in the last 168 hours.   DDimer No results for input(s): DDIMER in the last 168 hours.   Radiology    Dg Chest Port 1 View  Result Date: 07/24/2017 CLINICAL DATA:  Post catheterization, code STEMI. EXAM: PORTABLE CHEST 1 VIEW COMPARISON:  Chest radiograph Jul 17, 2017 at 1831 hours FINDINGS: Stable cardiomegaly. Severe coronary artery calcifications and/or stents. Calcified aortic knob. Increasing interstitial prominence without pleural effusion or focal consolidation. Strandy densities LEFT lung base. No pneumothorax. Soft tissue planes and included osseous structures are unchanged. IMPRESSION: Stable cardiomegaly. Slightly increasing interstitial prominence most compatible with pulmonary edema. LEFT lung base atelectasis. Aortic Atherosclerosis (ICD10-I70.0). Electronically Signed   By: Elon Alas M.D.   On: 07/16/2017 20:50   Dg Chest Portable 1  View  Result Date: 07/08/2017 CLINICAL DATA:  Chest pain EXAM: PORTABLE CHEST 1 VIEW COMPARISON:  11/30/2014 FINDINGS: Cardiomegaly. No confluent airspace opacities, effusions or edema. No acute bony abnormality. IMPRESSION: Cardiomegaly.  No active disease. Electronically Signed   By: Rolm Baptise M.D.   On: 07/05/2017 18:42    Cardiac Studies   Echo pending      Assessment & Plan    STEMI-LAD stent  History of prior coronary revascularization  Pulmonary edema  Oliguric-question contrast injury.  Atrial fibrillation-poorly compliant with anticoagulation  Stroke   The patient is without recurrent chest pain.  With her oliguria and bladder scan showing only scant urine despite a liter of fluid, and with her rales, we will give her a dose of Lasix.  We will repeat her creatinine today and then tomorrow.  I have been in touch with Dr. Lavone Nian.  He will assume her care  Signed, Virl Axe, MD  07/28/2017, 10:07 AM

## 2017-07-18 NOTE — Progress Notes (Signed)
errror

## 2017-07-18 NOTE — Progress Notes (Addendum)
Subjective:  She is well-known to me, history of non-STEMI in February 2015, had undergone stenting to her LAD on 04/18/2013 with implantation of a 3.0 x 15 mm and 3.0 x 38 mm Xience DES in the proximal and mid LAD.  She underwent repeat angiography and staged intervention to the right coronary artery with implantation of a 3.0 x 38 mm x 2 DES.  He has history of noncompliance, severe hyperlipidemia does not want to be on statins, prediabetes, hypertension and obesity.  She is also had atrial fibrillation for which she had undergone cardioversion in 2015 and has had recurrence of A. fib in the follow-up but did not want to be on medications and has been irregular in taking Eliquis.  She was admitted yesterday with right arm and leg tingling followed by severe nausea and emesis, went to the fire department followed by presenting to the emergency room where she was found to have acute ST segment elevation in the anterolateral lead with reciprocal inferior ST depression.  She was emergently taken to the cardiac catheterization lab yesterday and had a new proximal LAD occlusion, prior stents were widely patent, underwent successful angioplasty with implantation of synergy 3.5 x 20 mm stent.  I was called this morning as she is a patient of mine.  This morning she is feeling well and denies any chest pain or shortness of breath.  She is comfortably sitting on the side of the bed without any distress.  Family members present.  Objective:  Vital Signs in the last 24 hours: Temp:  [97.4 F (36.3 C)-98.5 F (36.9 C)] 97.7 F (36.5 C) (05/19 1207) Pulse Rate:  [0-92] 61 (05/19 0800) Resp:  [0-31] 17 (05/19 0800) BP: (85-137)/(46-96) 107/56 (05/19 0800) SpO2:  [0 %-100 %] 94 % (05/19 0800) Weight:  [85.2 kg (187 lb 13.3 oz)-86.2 kg (190 lb)] 85.2 kg (187 lb 13.3 oz) (05/18 2030)  Intake/Output from previous day: 05/18 0701 - 05/19 0700 In: 770.8 [I.V.:770.8] Out: -   Physical Exam:   Vitals:   06/30/2017 0800 07/11/2017 1207  BP: (!) 107/56   Pulse: 61   Resp: 17   Temp: (!) 97.4 F (36.3 C) 97.7 F (36.5 C)  SpO2: 94%    Body mass index is 33.27 kg/m.  General appearance: alert, cooperative, appears stated age and no distress Eyes: negative findings: lids and lashes normal Neck: no adenopathy, no carotid bruit, no JVD, supple, symmetrical, trachea midline and thyroid not enlarged, symmetric, no tenderness/mass/nodules Neck: JVP - normal, carotids 2+= without bruits Resp: clear to auscultation bilaterally Chest wall: no tenderness Cardio: regular rate and rhythm, S1, S2 normal, no murmur, click, rub or gallop GI: soft, non-tender; bowel sounds normal; no masses,  no organomegaly and obese Extremities: extremities normal, atraumatic, no cyanosis or edema and Vascular exam: Bilateral carotids 2+, bilateral radials 2+, femorals difficult to feel due to obesity, popliteals 1-2+, pedal pulses were faint bilaterally.  Capillary refill normal.    Lab Results: BMP Recent Labs    06/10/17 1451 07/16/2017 1839 07/07/2017 0311  NA 141 140 139  K 4.0 3.8 4.5  CL 103 102 108  CO2 _0 GLUCOSE 116* 191* 172*  BUN _1 CREATININE 0.82 0.91 0.84  CALCIUM 9.4 9.2 8.4*  GFRNONAA  --  57* >60  GFRAA  --  >60 >60    CBC Recent Labs  Lab 07/05/2017 1839 07/16/2017 0311  WBC 18.9* 12.4*  RBC 5.31* 4.69  HGB 14.6  12.8  HCT 44.3 39.8  PLT 250 238  MCV 83.4 84.9  MCH 27.5 27.3  MCHC 33.0 32.2  RDW 13.2 13.0  LYMPHSABS 2.5  --   MONOABS 1.6*  --   EOSABS 0.0  --   BASOSABS 0.0  --     HEMOGLOBIN A1C Lab Results  Component Value Date   HGBA1C 5.9 (H) 07/11/2017   MPG 122.63 07/01/2017    Cardiac Panel (last 3 results) Recent Labs    07/12/2017 2132 07/26/2017 0311 07/03/2017 0938  TROPONINI 60.81* >65.00* >65.00*    BNP (last 3 results) No results for input(s): PROBNP in the last 8760 hours.  TSH Recent Labs    06/10/17 1451  TSH 1.13    Lipid Panel      Component Value Date/Time   CHOL 224 (H) 07/11/2017 0311   TRIG 95 07/05/2017 0311   HDL 40 (L) 07/01/2017 0311   CHOLHDL 5.6 07/26/2017 0311   VLDL 19 07/27/2017 0311   LDLCALC 165 (H) 07/02/2017 0311   LDLDIRECT 198.0 12/17/2015 1206     Hepatic Function Panel Recent Labs    06/10/17 1451 07/01/2017 1839  PROT 6.6 6.5  ALBUMIN 4.0 3.7  AST 14 21  ALT 13 19  ALKPHOS 82 77  BILITOT 0.6 1.6*    Imaging: Dg Chest Port 1 View  Result Date: 07/15/2017 CLINICAL DATA:  Post catheterization, code STEMI. EXAM: PORTABLE CHEST 1 VIEW COMPARISON:  Chest radiograph Jul 17, 2017 at 1831 hours FINDINGS: Stable cardiomegaly. Severe coronary artery calcifications and/or stents. Calcified aortic knob. Increasing interstitial prominence without pleural effusion or focal consolidation. Strandy densities LEFT lung base. No pneumothorax. Soft tissue planes and included osseous structures are unchanged. IMPRESSION: Stable cardiomegaly. Slightly increasing interstitial prominence most compatible with pulmonary edema. LEFT lung base atelectasis. Aortic Atherosclerosis (ICD10-I70.0). Electronically Signed   By: Elon Alas M.D.   On: 07/03/2017 20:50   Dg Chest Portable 1 View  Result Date: 07/03/2017 CLINICAL DATA:  Chest pain EXAM: PORTABLE CHEST 1 VIEW COMPARISON:  11/30/2014 FINDINGS: Cardiomegaly. No confluent airspace opacities, effusions or edema. No acute bony abnormality. IMPRESSION: Cardiomegaly.  No active disease. Electronically Signed   By: Rolm Baptise M.D.   On: 07/16/2017 18:42    Cardiac Studies:  EKG: 07/28/2017: ST elevation in anterolateral leads with reciprocal ST depression in inferior leads suggestive of acute STEMI anterolateral. 07/05/2017: Normal sinus rhythm, poor R wave progression, cannot exclude anterior infarct old.  Inferolateral T wave inversion, cannot exclude ischemia.Marland Kitchen  ECHO: Pending  Scheduled Meds: . aspirin  81 mg Oral Daily  . atenolol  12.5 mg Oral  Daily  . atorvastatin  80 mg Oral Daily  . donepezil  10 mg Oral QHS  . fluticasone  2 spray Each Nare Daily  . pantoprazole  40 mg Oral Daily  . sodium chloride flush  3 mL Intravenous Q12H  . ticagrelor  90 mg Oral BID   Continuous Infusions: . sodium chloride 20 mL/hr at 07/27/2017 0700  . sodium chloride 125 mL/hr at 07/10/2017 0300  . sodium chloride     PRN Meds:.sodium chloride, acetaminophen, morphine injection, ondansetron (ZOFRAN) IV, sodium chloride flush  Assessment/Plan:  1.  Acute STEMI anterolateral wall S/P stenting of proximal LAD with 3.5 x 20 mm Synergy.  History of non-STEMI and PTCA with overlapping stents on 04/18/2013 with implantation of a 3.0 x 15 mm and 3.0 x 38 mm Xience DES in the proximal and mid LAD and staged intervention to  the right coronary artery with implantation of a 3.0 x 38 mm x 2 DES. 2.  Hyperlipidemia, patient not compliant with taking statins 3.  Prediabetes mellitus 4.  Dementia 5.  Paroxysmal atrial fibrillation, CHA2DS2-VASCScore: Risk Score  8. 6.  Hypertension 7.  History of  stroke  Recommendation: She is now on appropriate medical therapy, I have met with the patient and also 2 of her daughters and discussed the compliance issues.  I have discussed regarding prediabetes and also hyperlipidemia and also paroxysmal atrial fibrillation.  I am not sure that she will continue anticoagulation, patient's family will try to see if they can administer the medications themselves.  Until compliance is established, I would like her to continue aspirin and Brilinta for now.  Atenolol was held due to bradycardia.  She is only on 12.5 mg of atenolol.  We will continue to monitor.  I do not think she is in heart failure.  Reduced urine output was due to dehydration from nausea and vomiting, she had copious vomiting x3 upon presentation.  Adrian Prows, M.D. 07/23/2017, 12:41 PM [facility], [place] Pager: [phone] Office: [phone] If no answer:  [phone]

## 2017-07-18 NOTE — Progress Notes (Signed)
Patient ambulated 190 ft in hall with nurse holding unto patients' arm. Patient was very SOB and sats dropped as low as 78% on RA. We stopped 3 times to help her catch her breath. Concerned patient may need oxygen upon discharge.

## 2017-07-19 ENCOUNTER — Inpatient Hospital Stay (HOSPITAL_COMMUNITY): Payer: Medicare HMO

## 2017-07-19 ENCOUNTER — Encounter (HOSPITAL_COMMUNITY): Payer: Self-pay | Admitting: Cardiology

## 2017-07-19 DIAGNOSIS — I469 Cardiac arrest, cause unspecified: Secondary | ICD-10-CM | POA: Diagnosis not present

## 2017-07-19 DIAGNOSIS — I25110 Atherosclerotic heart disease of native coronary artery with unstable angina pectoris: Secondary | ICD-10-CM | POA: Diagnosis not present

## 2017-07-19 LAB — ECHOCARDIOGRAM COMPLETE: Weight: 3005.31 oz

## 2017-07-19 LAB — BASIC METABOLIC PANEL
ANION GAP: 11 (ref 5–15)
BUN: 14 mg/dL (ref 6–20)
CO2: 22 mmol/L (ref 22–32)
Calcium: 8.7 mg/dL — ABNORMAL LOW (ref 8.9–10.3)
Chloride: 103 mmol/L (ref 101–111)
Creatinine, Ser: 0.78 mg/dL (ref 0.44–1.00)
GLUCOSE: 190 mg/dL — AB (ref 65–99)
POTASSIUM: 4.3 mmol/L (ref 3.5–5.1)
Sodium: 136 mmol/L (ref 135–145)

## 2017-07-19 LAB — POCT ACTIVATED CLOTTING TIME: Activated Clotting Time: 499 seconds

## 2017-07-19 MED ORDER — MORPHINE SULFATE (PF) 4 MG/ML IV SOLN
INTRAVENOUS | Status: AC
  Administered 2017-07-19: 4 mg
  Filled 2017-07-19: qty 1

## 2017-07-19 MED ORDER — METOPROLOL TARTRATE 25 MG PO TABS
25.0000 mg | ORAL_TABLET | Freq: Two times a day (BID) | ORAL | Status: DC
  Administered 2017-07-19: 25 mg via ORAL
  Filled 2017-07-19 (×2): qty 1

## 2017-07-19 MED ORDER — METOPROLOL TARTRATE 5 MG/5ML IV SOLN
5.0000 mg | INTRAVENOUS | Status: DC | PRN
  Administered 2017-07-19 (×2): 5 mg via INTRAVENOUS
  Filled 2017-07-19 (×2): qty 5

## 2017-07-19 MED ORDER — METOPROLOL TARTRATE 5 MG/5ML IV SOLN
2.5000 mg | Freq: Four times a day (QID) | INTRAVENOUS | Status: DC | PRN
  Filled 2017-07-19: qty 5

## 2017-07-19 MED ORDER — FUROSEMIDE 10 MG/ML IJ SOLN
40.0000 mg | Freq: Once | INTRAMUSCULAR | Status: AC
  Administered 2017-07-19: 40 mg via INTRAVENOUS

## 2017-07-19 MED ORDER — FUROSEMIDE 10 MG/ML IJ SOLN
INTRAMUSCULAR | Status: AC
  Filled 2017-07-19: qty 4

## 2017-07-19 MED ORDER — FENTANYL CITRATE (PF) 100 MCG/2ML IJ SOLN
12.5000 ug | Freq: Once | INTRAMUSCULAR | Status: AC
  Administered 2017-07-19: 12.5 ug via INTRAVENOUS
  Filled 2017-07-19: qty 2

## 2017-07-19 MED ORDER — ASPIRIN 81 MG PO CHEW
81.0000 mg | CHEWABLE_TABLET | Freq: Every day | ORAL | Status: DC
  Administered 2017-07-19 – 2017-07-21 (×3): 81 mg via ORAL
  Filled 2017-07-19 (×3): qty 1

## 2017-07-19 MED ORDER — DILTIAZEM HCL-DEXTROSE 100-5 MG/100ML-% IV SOLN (PREMIX)
5.0000 mg/h | INTRAVENOUS | Status: DC
  Administered 2017-07-19: 5 mg/h via INTRAVENOUS
  Filled 2017-07-19: qty 100

## 2017-07-19 MED FILL — Heparin Sod (Porcine)-NaCl IV Soln 1000 Unit/500ML-0.9%: INTRAVENOUS | Qty: 1000 | Status: AC

## 2017-07-19 MED FILL — Nitroglycerin IV Soln 100 MCG/ML in D5W: INTRA_ARTERIAL | Qty: 10 | Status: AC

## 2017-07-19 NOTE — Progress Notes (Addendum)
Subjective:  Feels well No chest pain, shortness of breath Has no recollection of the VF event on 07/03/2017 Daughters Abigail Butts and Mariann Laster present at bedside.  Objective:  Vital Signs in the last 24 hours: Temp:  [97.7 F (36.5 C)-98.7 F (37.1 C)] 98.2 F (36.8 C) (05/20 0753) Pulse Rate:  [0-126] 124 (05/20 0843) Resp:  [0-24] 19 (05/20 0800) BP: (91-144)/(43-108) 109/91 (05/20 0843) SpO2:  [0 %-97 %] 90 % (05/20 0800)  Intake/Output from previous day: 05/19 0701 - 05/20 0700 In: 820 [I.V.:820] Out: -  Intake/Output from this shift: Total I/O In: 16.7 [I.V.:16.7] Out: 1 [Urine:1]  Physical Exam: Constitutional: She is oriented to person, place, and time and well-developed, well-nourished, and in no distress. No distress.  HENT:  Head: Atraumatic.  Eyes: Conjunctivae are normal.  Neck: Neck supple.  Cardiovascular:  Tachycardic, S1 variable, S2 normal, no murmur appreciated.  Right radial and right femoral cathsite with no bleeding, hematoma. Pulmonary/Chest: Effort normal and breath sounds normal.  Abdominal: Soft.  Musculoskeletal: Normal range of motion.  Neurological: She is alert and oriented to person, place, and time.  Skin: Skin is warm and dry.     Lab Results: Recent Labs    07/16/2017 0311 07/22/2017 2125  WBC 12.4* 18.7*  HGB 12.8 14.0  PLT 238 267   Recent Labs    07/14/2017 2125 07/19/17 0331  NA 138 136  K 3.8 4.3  CL 104 103  CO2 22 22  GLUCOSE 165* 190*  BUN 15 14  CREATININE 0.94 0.78   Recent Labs    07/29/2017 0938 07/03/2017 2125  TROPONINI >65.00* >65.00*   Hepatic Function Panel Recent Labs    07/28/2017 1839  PROT 6.5  ALBUMIN 3.7  AST 21  ALT 19  ALKPHOS 77  BILITOT 1.6*   Recent Labs    07/02/2017 0311  CHOL 224*   Imaging: CXR 07/13/2017: CLINICAL DATA:  Cough  EXAM: PORTABLE CHEST 1 VIEW  COMPARISON:  07/22/2017, 11/30/2014  FINDINGS: Asymmetrical right greater than left interstitial opacity,  slight decreased compared to prior. Cardiomegaly. Aortic atherosclerosis. No pleural effusion or pneumothorax.  IMPRESSION: Asymmetrical right greater than left interstitial opacity, possible asymmetric edema. Cardiomegaly.   Cardiac Studies: EKG 07/03/2017: A. fib with RVR at the rate of 130 bpm, ST elevation in 1 and aVL and V3 to V6, new since EKG performed earlier this morning patient was in sinus rhythm with poor R wave progression and lateral T wave inversion.  Echocardiogram pending Outpatient echocardiogram 09/24/2015: Echocardiogram 09/24/2015: Left ventricle cavity is normal in size. Mild concentric hypertrophy of the left ventricle. Normal global wall motion. Doppler evidence of grade I (impaired) diastolic dysfunction. Calculated EF 63%. Trace mitral regurgitation. Mild calcification of the mitral valve annulus. Trace tricuspid regurgitation. Unable to estimate PA pressure due to absence/minimal TR signal. Overall no change in study since 12/26/2013.    Assessment: 84 y/o Caucasian female S/p Large anterior wall MI 07/13/2017 S/p primary PCI to mid LAD 07/11/2017 Synergy DES 3.5 X 20 mm DES CAD s/p prior prox LAD, prox-mid RCA PCI with patent stents VF arrest 06/30/2017: Cath with no acute stent thrombosis Paroxysmal atrial fibrillation, currently in RVR. CHA2DS2VASc score 5 Type 2 DM H/o meningioma  Recommendations: VF arrest within 48 hrs after a large anterior wall MI likely related to MI. Echocardiogram pending. She will need wearable defibrilator, while assessing the need for ICD 90 days post revascualarization. Remains tachycardic with Afib RVR, but fortunately, she is not in cardiogenic  shock. Continue IV amiodarone and atenolol 25 mg bid. Add IV diltiazem for rate control. Will obtain echocardiogram when ventricular rate <110 bpm. 2.5 mg IV metoprolol q 4 hr prn for rate control. Continue ASA/brilinta and eliquis. Will stop ASA after 1 month. Will add ACEi  prior to discharge Continue Lipitor 80 mg daily.   LOS: 2 days    Viliami Bracco J Shalonda Sachse 07/19/2017, 10:10 AM  Lauris Keepers Esther Hardy, MD [facility]. [place] Pager: [phone] Office: [phone] If no answer Cell [phone]

## 2017-07-19 NOTE — Care Management Note (Addendum)
Case Management Note  Patient Details  Name: [name] MRN: [id number] Date of Birth: 02-20-1932  Subjective/Objective:  Pt admitted with MI                  Action/Plan:   PTA independent from home alone.  Pt will discharge to daughters home to allow for additional recovery prior to returning home.  Pt has PCP and is able to afford prescription medications.  Per daughters - pt may benefit from [facility] to assist with medication management - CM requested order.  CM submitted benefit check for Brilinta - CM will provide free 30 card prior to discharge.  CM will continue to follow for discharge needs   Expected Discharge Date:                  Expected Discharge Plan:  [place]  In-House Referral:     Discharge planning Services  CM Consult  Post Acute Care Choice:    Choice offered to:     DME Arranged:    DME Agency:     HH Arranged:    HH Agency:     Status of Service:     If discussed at [employer], dates discussed:    Additional Comments: Update:  Pt will discharge home with life vest, form placed on shadow chart for physician to complete.  Life Vest aware of pending referral Maryclare Labrador, RN 07/19/2017, 11:41 AM

## 2017-07-19 NOTE — Progress Notes (Signed)
Pt complaining of neck pain, MD notified and orders placed for 12.63mcg of Fent once. Will continue to monitor pt. Vital signs stable

## 2017-07-19 NOTE — Progress Notes (Signed)
ECHO at bedside.

## 2017-07-19 NOTE — Progress Notes (Signed)
2102 pt found in sustained v tach and unresponsive, code called .defibrillation x4 with spontaneous return of ros, pt now in afib rvr with pvcs, , DR Einar Gip paged and at bedside. Pt to return to cath lab see code sheet for further info

## 2017-07-19 NOTE — Progress Notes (Signed)
  Echocardiogram 2D Echocardiogram has been performed.  Merrie Roof F 07/19/2017, 2:14 PM

## 2017-07-20 ENCOUNTER — Inpatient Hospital Stay (HOSPITAL_COMMUNITY): Payer: Medicare HMO

## 2017-07-20 ENCOUNTER — Encounter (HOSPITAL_COMMUNITY): Admission: EM | Disposition: E | Payer: Self-pay | Source: Home / Self Care | Attending: Cardiology

## 2017-07-20 ENCOUNTER — Encounter (HOSPITAL_COMMUNITY): Payer: Self-pay | Admitting: Cardiology

## 2017-07-20 DIAGNOSIS — R570 Cardiogenic shock: Secondary | ICD-10-CM

## 2017-07-20 DIAGNOSIS — I998 Other disorder of circulatory system: Secondary | ICD-10-CM

## 2017-07-20 DIAGNOSIS — I25110 Atherosclerotic heart disease of native coronary artery with unstable angina pectoris: Secondary | ICD-10-CM | POA: Diagnosis not present

## 2017-07-20 DIAGNOSIS — I469 Cardiac arrest, cause unspecified: Secondary | ICD-10-CM | POA: Diagnosis not present

## 2017-07-20 DIAGNOSIS — J810 Acute pulmonary edema: Secondary | ICD-10-CM

## 2017-07-20 DIAGNOSIS — G9340 Encephalopathy, unspecified: Secondary | ICD-10-CM

## 2017-07-20 DIAGNOSIS — I213 ST elevation (STEMI) myocardial infarction of unspecified site: Secondary | ICD-10-CM

## 2017-07-20 DIAGNOSIS — I472 Ventricular tachycardia: Secondary | ICD-10-CM

## 2017-07-20 DIAGNOSIS — J9601 Acute respiratory failure with hypoxia: Secondary | ICD-10-CM

## 2017-07-20 DIAGNOSIS — J9602 Acute respiratory failure with hypercapnia: Secondary | ICD-10-CM

## 2017-07-20 HISTORY — PX: VENTRICULAR ASSIST DEVICE INSERTION: CATH118273

## 2017-07-20 HISTORY — PX: RIGHT HEART CATH: CATH118263

## 2017-07-20 LAB — BASIC METABOLIC PANEL
ANION GAP: 11 (ref 5–15)
Anion gap: 8 (ref 5–15)
BUN: 20 mg/dL (ref 6–20)
BUN: 22 mg/dL — ABNORMAL HIGH (ref 6–20)
CHLORIDE: 103 mmol/L (ref 101–111)
CHLORIDE: 101 mmol/L (ref 101–111)
CO2: 22 mmol/L (ref 22–32)
CO2: 21 mmol/L — AB (ref 22–32)
CREATININE: 1.03 mg/dL — AB (ref 0.44–1.00)
Calcium: 8.1 mg/dL — ABNORMAL LOW (ref 8.9–10.3)
Calcium: 7.9 mg/dL — ABNORMAL LOW (ref 8.9–10.3)
Creatinine, Ser: 0.89 mg/dL (ref 0.44–1.00)
GFR calc non Af Amer: 58 mL/min — ABNORMAL LOW (ref >60)
GFR calc non Af Amer: 49 mL/min — ABNORMAL LOW (ref >60)
GFR, EST AFRICAN AMERICAN: 57 mL/min — AB (ref >60)
Glucose, Bld: 229 mg/dL — ABNORMAL HIGH (ref 65–99)
Glucose, Bld: 305 mg/dL — ABNORMAL HIGH (ref 65–99)
POTASSIUM: 4 mmol/L (ref 3.5–5.1)
Potassium: 3.5 mmol/L (ref 3.5–5.1)
SODIUM: 133 mmol/L — AB (ref 135–145)
Sodium: 133 mmol/L — ABNORMAL LOW (ref 135–145)

## 2017-07-20 LAB — HEMOGLOBIN AND HEMATOCRIT, BLOOD
HCT: 42.1 % (ref 36.0–46.0)
Hemoglobin: 13.2 g/dL (ref 12.0–15.0)

## 2017-07-20 LAB — CBC
HCT: 30.5 % — ABNORMAL LOW (ref 36.0–46.0)
HCT: 35.9 % — ABNORMAL LOW (ref 36.0–46.0)
HEMATOCRIT: 39.2 % (ref 36.0–46.0)
HEMATOCRIT: 37.7 % (ref 36.0–46.0)
HEMOGLOBIN: 12 g/dL (ref 12.0–15.0)
HEMOGLOBIN: 11.9 g/dL — AB (ref 12.0–15.0)
Hemoglobin: 10.5 g/dL — ABNORMAL LOW (ref 12.0–15.0)
Hemoglobin: 12.6 g/dL (ref 12.0–15.0)
MCH: 27 pg (ref 26.0–34.0)
MCH: 27.7 pg (ref 26.0–34.0)
MCH: 28.1 pg (ref 26.0–34.0)
MCH: 28.2 pg (ref 26.0–34.0)
MCHC: 30.6 g/dL (ref 30.0–36.0)
MCHC: 33.1 g/dL (ref 30.0–36.0)
MCHC: 33.4 g/dL (ref 30.0–36.0)
MCHC: 34.4 g/dL (ref 30.0–36.0)
MCV: 81.8 fL (ref 78.0–100.0)
MCV: 83.5 fL (ref 78.0–100.0)
MCV: 84 fL (ref 78.0–100.0)
MCV: 88.3 fL (ref 78.0–100.0)
PLATELETS: 181 10*3/uL (ref 150–400)
PLATELETS: 184 10*3/uL (ref 150–400)
PLATELETS: 206 10*3/uL (ref 150–400)
PLATELETS: 238 10*3/uL (ref 150–400)
RBC: 3.73 MIL/uL — ABNORMAL LOW (ref 3.87–5.11)
RBC: 4.3 MIL/uL (ref 3.87–5.11)
RBC: 4.44 MIL/uL (ref 3.87–5.11)
RBC: 4.49 MIL/uL (ref 3.87–5.11)
RDW: 13.3 % (ref 11.5–15.5)
RDW: 13.3 % (ref 11.5–15.5)
RDW: 13.4 % (ref 11.5–15.5)
RDW: 13.4 % (ref 11.5–15.5)
WBC: 17.7 10*3/uL — ABNORMAL HIGH (ref 4.0–10.5)
WBC: 20.4 10*3/uL — AB (ref 4.0–10.5)
WBC: 20.6 10*3/uL — ABNORMAL HIGH (ref 4.0–10.5)
WBC: 23.2 10*3/uL — AB (ref 4.0–10.5)

## 2017-07-20 LAB — POCT I-STAT 3, ART BLOOD GAS (G3+)
ACID-BASE DEFICIT: 2 mmol/L (ref 0.0–2.0)
Acid-base deficit: 6 mmol/L — ABNORMAL HIGH (ref 0.0–2.0)
Acid-base deficit: 9 mmol/L — ABNORMAL HIGH (ref 0.0–2.0)
Acid-base deficit: 8 mmol/L — ABNORMAL HIGH (ref 0.0–2.0)
Acid-base deficit: 6 mmol/L — ABNORMAL HIGH (ref 0.0–2.0)
Acid-base deficit: 2 mmol/L (ref 0.0–2.0)
BICARBONATE: 19.2 mmol/L — AB (ref 20.0–28.0)
BICARBONATE: 22.7 mmol/L (ref 20.0–28.0)
BICARBONATE: 20.3 mmol/L (ref 20.0–28.0)
Bicarbonate: 23.5 mmol/L (ref 20.0–28.0)
Bicarbonate: 20.9 mmol/L (ref 20.0–28.0)
Bicarbonate: 19.9 mmol/L — ABNORMAL LOW (ref 20.0–28.0)
Bicarbonate: 19.9 mmol/L — ABNORMAL LOW (ref 20.0–28.0)
O2 SAT: 91 %
O2 SAT: 95 %
O2 SAT: 99 %
O2 Saturation: 88 %
O2 Saturation: 80 %
O2 Saturation: 99 %
O2 Saturation: 97 %
PCO2 ART: 59.2 mmHg — AB (ref 32.0–48.0)
PCO2 ART: 26.8 mmHg — AB (ref 32.0–48.0)
PH ART: 7.201 — AB (ref 7.350–7.450)
PH ART: 7.481 — AB (ref 7.350–7.450)
PO2 ART: 72 mmHg — AB (ref 83.0–108.0)
Patient temperature: 37.2
TCO2: 25 mmol/L (ref 22–32)
TCO2: 23 mmol/L (ref 22–32)
TCO2: 21 mmol/L — ABNORMAL LOW (ref 22–32)
TCO2: 20 mmol/L — ABNORMAL LOW (ref 22–32)
TCO2: 21 mmol/L — ABNORMAL LOW (ref 22–32)
TCO2: 24 mmol/L (ref 22–32)
TCO2: 21 mmol/L — ABNORMAL LOW (ref 22–32)
pCO2 arterial: 59.3 mmHg — ABNORMAL HIGH (ref 32.0–48.0)
pCO2 arterial: 46.4 mmHg (ref 32.0–48.0)
pCO2 arterial: 35.2 mmHg (ref 32.0–48.0)
pCO2 arterial: 31.3 mmHg — ABNORMAL LOW (ref 32.0–48.0)
pCO2 arterial: 27.1 mmHg — ABNORMAL LOW (ref 32.0–48.0)
pH, Arterial: 7.154 — CL (ref 7.350–7.450)
pH, Arterial: 7.241 — ABNORMAL LOW (ref 7.350–7.450)
pH, Arterial: 7.345 — ABNORMAL LOW (ref 7.350–7.450)
pH, Arterial: 7.469 — ABNORMAL HIGH (ref 7.350–7.450)
pH, Arterial: 7.483 — ABNORMAL HIGH (ref 7.350–7.450)
pO2, Arterial: 64 mmHg — ABNORMAL LOW (ref 83.0–108.0)
pO2, Arterial: 57 mmHg — ABNORMAL LOW (ref 83.0–108.0)
pO2, Arterial: 72 mmHg — ABNORMAL LOW (ref 83.0–108.0)
pO2, Arterial: 149 mmHg — ABNORMAL HIGH (ref 83.0–108.0)
pO2, Arterial: 81 mmHg — ABNORMAL LOW (ref 83.0–108.0)
pO2, Arterial: 145 mmHg — ABNORMAL HIGH (ref 83.0–108.0)

## 2017-07-20 LAB — ECHOCARDIOGRAM LIMITED
HEIGHTINCHES: 65 in
WEIGHTICAEL: 3005.31 [oz_av]

## 2017-07-20 LAB — COMPREHENSIVE METABOLIC PANEL
ALT: 160 U/L — AB (ref 14–54)
AST: 236 U/L — AB (ref 15–41)
Albumin: 2.3 g/dL — ABNORMAL LOW (ref 3.5–5.0)
Alkaline Phosphatase: 77 U/L (ref 38–126)
Anion gap: 8 (ref 5–15)
BUN: 19 mg/dL (ref 6–20)
CHLORIDE: 104 mmol/L (ref 101–111)
CO2: 21 mmol/L — AB (ref 22–32)
Calcium: 7.7 mg/dL — ABNORMAL LOW (ref 8.9–10.3)
Creatinine, Ser: 0.98 mg/dL (ref 0.44–1.00)
GFR, EST NON AFRICAN AMERICAN: 52 mL/min — AB (ref >60)
Glucose, Bld: 256 mg/dL — ABNORMAL HIGH (ref 65–99)
POTASSIUM: 4.1 mmol/L (ref 3.5–5.1)
SODIUM: 133 mmol/L — AB (ref 135–145)
Total Bilirubin: 1.6 mg/dL — ABNORMAL HIGH (ref 0.3–1.2)
Total Protein: 4.6 g/dL — ABNORMAL LOW (ref 6.5–8.1)

## 2017-07-20 LAB — RENAL FUNCTION PANEL
ALBUMIN: 2.4 g/dL — AB (ref 3.5–5.0)
ANION GAP: 10 (ref 5–15)
BUN: 16 mg/dL (ref 6–20)
CO2: 21 mmol/L — ABNORMAL LOW (ref 22–32)
Calcium: 7.5 mg/dL — ABNORMAL LOW (ref 8.9–10.3)
Chloride: 107 mmol/L (ref 101–111)
Creatinine, Ser: 0.94 mg/dL (ref 0.44–1.00)
GFR, EST NON AFRICAN AMERICAN: 55 mL/min — AB (ref >60)
Glucose, Bld: 222 mg/dL — ABNORMAL HIGH (ref 65–99)
PHOSPHORUS: 4.3 mg/dL (ref 2.5–4.6)
POTASSIUM: 3.7 mmol/L (ref 3.5–5.1)
Sodium: 138 mmol/L (ref 135–145)

## 2017-07-20 LAB — TROPONIN I
TROPONIN I: 27.06 ng/mL — AB (ref <0.03)
Troponin I: 25.7 ng/mL
Troponin I: 27.12 ng/mL
Troponin I: 36.12 ng/mL

## 2017-07-20 LAB — LACTIC ACID, PLASMA
LACTIC ACID, VENOUS: 7.5 mmol/L — AB (ref 0.5–1.9)
LACTIC ACID, VENOUS: 4.2 mmol/L — AB (ref 0.5–1.9)
LACTIC ACID, VENOUS: 3.7 mmol/L — AB (ref 0.5–1.9)
Lactic Acid, Venous: 3.5 mmol/L (ref 0.5–1.9)
Lactic Acid, Venous: 6 mmol/L (ref 0.5–1.9)

## 2017-07-20 LAB — POCT ACTIVATED CLOTTING TIME
ACTIVATED CLOTTING TIME: 164 s
ACTIVATED CLOTTING TIME: 164 s
ACTIVATED CLOTTING TIME: 169 s
Activated Clotting Time: 335 s
Activated Clotting Time: 147 seconds
Activated Clotting Time: 153 seconds
Activated Clotting Time: 169 seconds
Activated Clotting Time: 164 seconds

## 2017-07-20 LAB — COOXEMETRY PANEL
CARBOXYHEMOGLOBIN: 1 % (ref 0.5–1.5)
CARBOXYHEMOGLOBIN: 1 % (ref 0.5–1.5)
Carboxyhemoglobin: 1.1 % (ref 0.5–1.5)
Carboxyhemoglobin: 1.6 % — ABNORMAL HIGH (ref 0.5–1.5)
METHEMOGLOBIN: 1.2 % (ref 0.0–1.5)
METHEMOGLOBIN: 0.9 % (ref 0.0–1.5)
Methemoglobin: 2.4 % — ABNORMAL HIGH (ref 0.0–1.5)
Methemoglobin: 1.4 % (ref 0.0–1.5)
O2 SAT: 88.9 %
O2 SAT: 99.1 %
O2 SAT: 60.2 %
O2 Saturation: 64.4 %
TOTAL HEMOGLOBIN: 12.5 g/dL (ref 12.0–16.0)
TOTAL HEMOGLOBIN: 12.8 g/dL (ref 12.0–16.0)
TOTAL HEMOGLOBIN: 11.5 g/dL — AB (ref 12.0–16.0)
Total hemoglobin: 12.8 g/dL (ref 12.0–16.0)

## 2017-07-20 LAB — BPAM PLATELET PHERESIS
Blood Product Expiration Date: 201905232359
Unit Type and Rh: 6200

## 2017-07-20 LAB — PREPARE PLATELET PHERESIS: Unit division: 0

## 2017-07-20 LAB — CORTISOL: CORTISOL PLASMA: 32 ug/dL

## 2017-07-20 LAB — GLUCOSE, CAPILLARY
GLUCOSE-CAPILLARY: 255 mg/dL — AB (ref 65–99)
Glucose-Capillary: 198 mg/dL — ABNORMAL HIGH (ref 65–99)

## 2017-07-20 LAB — POCT I-STAT 3, VENOUS BLOOD GAS (G3P V)
Acid-base deficit: 8 mmol/L — ABNORMAL HIGH (ref 0.0–2.0)
Bicarbonate: 21.5 mmol/L (ref 20.0–28.0)
O2 SAT: 49 %
PO2 VEN: 33 mmHg (ref 32.0–45.0)
TCO2: 23 mmol/L (ref 22–32)
pCO2, Ven: 57.2 mmHg (ref 44.0–60.0)
pH, Ven: 7.183 — CL (ref 7.250–7.430)

## 2017-07-20 LAB — POCT I-STAT 4, (NA,K, GLUC, HGB,HCT)
Glucose, Bld: 242 mg/dL — ABNORMAL HIGH (ref 65–99)
HEMATOCRIT: 35 % — AB (ref 36.0–46.0)
HEMOGLOBIN: 11.9 g/dL — AB (ref 12.0–15.0)
POTASSIUM: 4.2 mmol/L (ref 3.5–5.1)
SODIUM: 134 mmol/L — AB (ref 135–145)

## 2017-07-20 LAB — PHOSPHORUS: PHOSPHORUS: 4.4 mg/dL (ref 2.5–4.6)

## 2017-07-20 LAB — PREPARE RBC (CROSSMATCH)

## 2017-07-20 LAB — MAGNESIUM: MAGNESIUM: 1.8 mg/dL (ref 1.7–2.4)

## 2017-07-20 LAB — PROCALCITONIN: PROCALCITONIN: 0.27 ng/mL

## 2017-07-20 SURGERY — VENTRICULAR ASSIST DEVICE INSERTION
Anesthesia: LOCAL

## 2017-07-20 MED ORDER — SODIUM CHLORIDE 0.9 % IV BOLUS
500.0000 mL | Freq: Once | INTRAVENOUS | Status: AC
  Administered 2017-07-20: 500 mL via INTRAVENOUS

## 2017-07-20 MED ORDER — CHLORHEXIDINE GLUCONATE 0.12% ORAL RINSE (MEDLINE KIT)
15.0000 mL | Freq: Two times a day (BID) | OROMUCOSAL | Status: DC
  Administered 2017-07-20: 15 mL via OROMUCOSAL

## 2017-07-20 MED ORDER — FENTANYL BOLUS VIA INFUSION
25.0000 ug | INTRAVENOUS | Status: DC | PRN
  Administered 2017-07-20: 50 ug via INTRAVENOUS
  Administered 2017-07-22 – 2017-07-23 (×4): 25 ug via INTRAVENOUS
  Filled 2017-07-20: qty 25

## 2017-07-20 MED ORDER — MAGNESIUM SULFATE 2 GM/50ML IV SOLN
2.0000 g | Freq: Once | INTRAVENOUS | Status: AC
  Administered 2017-07-20: 2 g via INTRAVENOUS
  Filled 2017-07-20: qty 50

## 2017-07-20 MED ORDER — LIDOCAINE HCL (PF) 1 % IJ SOLN
INTRAMUSCULAR | Status: DC | PRN
  Administered 2017-07-20: 20 mL

## 2017-07-20 MED ORDER — MILRINONE LACTATE IN DEXTROSE 20-5 MG/100ML-% IV SOLN
0.1250 ug/kg/min | INTRAVENOUS | Status: DC
  Administered 2017-07-20: 0.375 ug/kg/min via INTRAVENOUS
  Administered 2017-07-20: 0.125 ug/kg/min via INTRAVENOUS
  Administered 2017-07-21 – 2017-07-22 (×3): 0.375 ug/kg/min via INTRAVENOUS
  Administered 2017-07-23 – 2017-07-25 (×3): 0.125 ug/kg/min via INTRAVENOUS
  Filled 2017-07-20 (×8): qty 100

## 2017-07-20 MED ORDER — SODIUM CHLORIDE 0.9 % IV SOLN
1.0000 g | Freq: Once | INTRAVENOUS | Status: DC
  Filled 2017-07-20: qty 10

## 2017-07-20 MED ORDER — POTASSIUM CHLORIDE 10 MEQ/100ML IV SOLN
INTRAVENOUS | Status: AC
  Filled 2017-07-20: qty 100

## 2017-07-20 MED ORDER — ETOMIDATE 2 MG/ML IV SOLN
0.3000 mg/kg | Freq: Once | INTRAVENOUS | Status: AC
  Administered 2017-07-20: 25 mg via INTRAVENOUS

## 2017-07-20 MED ORDER — HEPARIN (PORCINE) IN NACL 100-0.45 UNIT/ML-% IJ SOLN: 200.0000 [IU]/h | INTRAMUSCULAR | Status: DC

## 2017-07-20 MED ORDER — CHLORHEXIDINE GLUCONATE 0.12% ORAL RINSE (MEDLINE KIT)
15.0000 mL | Freq: Two times a day (BID) | OROMUCOSAL | Status: DC
  Administered 2017-07-21 – 2017-07-24 (×8): 15 mL via OROMUCOSAL

## 2017-07-20 MED ORDER — HEPARIN SODIUM (PORCINE) 5000 UNIT/ML IJ SOLN: 50000.0000 [IU] | INTRAVENOUS | Status: DC

## 2017-07-20 MED ORDER — HEPARIN SODIUM (PORCINE) 5000 UNIT/ML IJ SOLN
6250.0000 [IU] | INTRAVENOUS | Status: DC
  Administered 2017-07-20: 6250 [IU]
  Filled 2017-07-20: qty 1.25

## 2017-07-20 MED ORDER — "THROMBI-PAD 3""X3"" EX PADS"
1.0000 | MEDICATED_PAD | Freq: Once | CUTANEOUS | Status: AC
  Administered 2017-07-21: 1 via TOPICAL
  Filled 2017-07-20 (×2): qty 1

## 2017-07-20 MED ORDER — DEXTROSE 5 % IV SOLN
300.0000 mg | Freq: Once | INTRAVENOUS | Status: AC
  Administered 2017-07-20: 300 mg via INTRAVENOUS

## 2017-07-20 MED ORDER — PHENYLEPHRINE HCL-NACL 10-0.9 MG/250ML-% IV SOLN
0.0000 ug/min | INTRAVENOUS | Status: DC
  Filled 2017-07-20 (×2): qty 250

## 2017-07-20 MED ORDER — DOCUSATE SODIUM 50 MG/5ML PO LIQD
100.0000 mg | Freq: Two times a day (BID) | ORAL | Status: DC
  Administered 2017-07-20 – 2017-07-24 (×9): 100 mg
  Filled 2017-07-20 (×9): qty 10

## 2017-07-20 MED ORDER — MIDAZOLAM HCL 2 MG/2ML IJ SOLN
1.0000 mg | INTRAMUSCULAR | Status: DC | PRN
  Administered 2017-07-20 (×3): 1 mg via INTRAVENOUS
  Administered 2017-07-20: 2 mg via INTRAVENOUS
  Administered 2017-07-21 (×6): 1 mg via INTRAVENOUS
  Filled 2017-07-20 (×9): qty 2

## 2017-07-20 MED ORDER — ORAL CARE MOUTH RINSE
15.0000 mL | Freq: Four times a day (QID) | OROMUCOSAL | Status: DC
  Administered 2017-07-21 – 2017-07-25 (×15): 15 mL via OROMUCOSAL

## 2017-07-20 MED ORDER — IPRATROPIUM-ALBUTEROL 0.5-2.5 (3) MG/3ML IN SOLN: 3.0000 mL | Freq: Four times a day (QID) | RESPIRATORY_TRACT | Status: DC | PRN

## 2017-07-20 MED ORDER — HEPARIN (PORCINE) IN NACL 2-0.9 UNITS/ML
INTRAMUSCULAR | Status: AC | PRN
  Administered 2017-07-20: 500 mL

## 2017-07-20 MED ORDER — AMIODARONE HCL IN DEXTROSE 360-4.14 MG/200ML-% IV SOLN
60.0000 mg/h | INTRAVENOUS | Status: DC
Start: 2017-07-20 — End: 2017-07-25
  Administered 2017-07-20: 30 mg/h via INTRAVENOUS
  Administered 2017-07-20 – 2017-07-25 (×20): 60 mg/h via INTRAVENOUS
  Filled 2017-07-20 (×22): qty 200

## 2017-07-20 MED ORDER — ACETAMINOPHEN 160 MG/5ML PO SOLN: 650.0000 mg | Freq: Four times a day (QID) | ORAL | Status: DC | PRN

## 2017-07-20 MED ORDER — DIGOXIN 0.25 MG/ML IJ SOLN
0.1250 mg | Freq: Four times a day (QID) | INTRAMUSCULAR | Status: AC
  Administered 2017-07-21: 0.125 mg via INTRAVENOUS
  Administered 2017-07-21: 02:00:00 via INTRAVENOUS
  Filled 2017-07-20 (×2): qty 2

## 2017-07-20 MED ORDER — CEFAZOLIN SODIUM-DEXTROSE 2-4 GM/100ML-% IV SOLN
INTRAVENOUS | Status: AC
  Filled 2017-07-20: qty 100

## 2017-07-20 MED ORDER — SODIUM CHLORIDE 0.9 % IV BOLUS
250.0000 mL | Freq: Once | INTRAVENOUS | Status: AC
  Administered 2017-07-20: 250 mL via INTRAVENOUS

## 2017-07-20 MED ORDER — LIDOCAINE HCL (PF) 1 % IJ SOLN
INTRAMUSCULAR | Status: AC
  Filled 2017-07-20: qty 30

## 2017-07-20 MED ORDER — AMIODARONE HCL IN DEXTROSE 360-4.14 MG/200ML-% IV SOLN: 30.0000 mg/h | INTRAVENOUS | Status: DC

## 2017-07-20 MED ORDER — SODIUM CHLORIDE 0.9 % IV SOLN
INTRAVENOUS | Status: DC
  Administered 2017-07-20 – 2017-07-22 (×2): via INTRAVENOUS

## 2017-07-20 MED ORDER — FUROSEMIDE 10 MG/ML IJ SOLN
40.0000 mg | Freq: Two times a day (BID) | INTRAMUSCULAR | Status: DC
  Filled 2017-07-20: qty 4

## 2017-07-20 MED ORDER — SODIUM CHLORIDE 0.9 % IV SOLN: Freq: Once | INTRAVENOUS | Status: DC

## 2017-07-20 MED ORDER — SODIUM CHLORIDE 0.9 % IV SOLN
250.0000 mL | Freq: Once | INTRAVENOUS | Status: AC
  Administered 2017-07-20: 250 mL via INTRAVENOUS

## 2017-07-20 MED ORDER — MIDAZOLAM HCL 2 MG/2ML IJ SOLN
INTRAMUSCULAR | Status: AC
  Filled 2017-07-20: qty 2

## 2017-07-20 MED ORDER — FENTANYL CITRATE (PF) 100 MCG/2ML IJ SOLN: 50.0000 ug | Freq: Once | INTRAMUSCULAR | Status: DC

## 2017-07-20 MED ORDER — MIDAZOLAM HCL 2 MG/2ML IJ SOLN
2.0000 mg | Freq: Once | INTRAMUSCULAR | Status: AC
  Administered 2017-07-20: 2 mg via INTRAVENOUS

## 2017-07-20 MED ORDER — HEPARIN SODIUM (PORCINE) 1000 UNIT/ML IJ SOLN
INTRAMUSCULAR | Status: DC | PRN
  Administered 2017-07-20: 8000 [IU] via INTRAVENOUS

## 2017-07-20 MED ORDER — NOREPINEPHRINE 4 MG/250ML-% IV SOLN
0.0000 ug/min | INTRAVENOUS | Status: DC
  Filled 2017-07-20: qty 250

## 2017-07-20 MED ORDER — BISACODYL 10 MG RE SUPP: 10.0000 mg | Freq: Every day | RECTAL | Status: DC | PRN

## 2017-07-20 MED ORDER — AMIODARONE HCL IN DEXTROSE 360-4.14 MG/200ML-% IV SOLN: 60.0000 mg/h | INTRAVENOUS | Status: DC

## 2017-07-20 MED ORDER — FENTANYL 2500MCG IN NS 250ML (10MCG/ML) PREMIX INFUSION
25.0000 ug/h | INTRAVENOUS | Status: DC
  Administered 2017-07-20: 100 ug/h via INTRAVENOUS
  Administered 2017-07-20 – 2017-07-22 (×4): 150 ug/h via INTRAVENOUS
  Administered 2017-07-23: 225 ug/h via INTRAVENOUS
  Administered 2017-07-23: 200 ug/h via INTRAVENOUS
  Administered 2017-07-23: 180 ug/h via INTRAVENOUS
  Administered 2017-07-24: 200 ug/h via INTRAVENOUS
  Administered 2017-07-24: 225 ug/h via INTRAVENOUS
  Administered 2017-07-25: 200 ug/h via INTRAVENOUS
  Filled 2017-07-20 (×11): qty 250

## 2017-07-20 MED ORDER — ACETAMINOPHEN 650 MG RE SUPP
650.0000 mg | Freq: Four times a day (QID) | RECTAL | Status: DC | PRN
  Filled 2017-07-20: qty 1

## 2017-07-20 MED ORDER — LIDOCAINE IN D5W 4-5 MG/ML-% IV SOLN: 4.0000 mg/min | INTRAVENOUS | Status: DC

## 2017-07-20 MED ORDER — POTASSIUM CHLORIDE 10 MEQ/100ML IV SOLN
10.0000 meq | INTRAVENOUS | Status: AC
  Administered 2017-07-20 (×3): 10 meq via INTRAVENOUS
  Filled 2017-07-20 (×3): qty 100

## 2017-07-20 MED ORDER — HEPARIN SODIUM (PORCINE) 1000 UNIT/ML IJ SOLN
INTRAMUSCULAR | Status: AC
  Filled 2017-07-20: qty 1

## 2017-07-20 MED ORDER — NOREPINEPHRINE 4 MG/250ML-% IV SOLN
INTRAVENOUS | Status: AC
  Filled 2017-07-20: qty 250

## 2017-07-20 MED ORDER — DOCUSATE SODIUM 50 MG/5ML PO LIQD: 100.0000 mg | Freq: Two times a day (BID) | ORAL | Status: DC | PRN

## 2017-07-20 MED ORDER — LIDOCAINE-EPINEPHRINE 1 %-1:100000 IJ SOLN
INTRAMUSCULAR | Status: AC
  Filled 2017-07-20: qty 1

## 2017-07-20 MED ORDER — VASOPRESSIN 20 UNIT/ML IV SOLN
0.0400 [IU]/min | INTRAVENOUS | Status: DC
  Filled 2017-07-20: qty 2

## 2017-07-20 MED ORDER — LEVALBUTEROL HCL 0.63 MG/3ML IN NEBU
0.6300 mg | INHALATION_SOLUTION | Freq: Four times a day (QID) | RESPIRATORY_TRACT | Status: DC | PRN
  Administered 2017-07-21 – 2017-07-22 (×2): 0.63 mg via RESPIRATORY_TRACT
  Filled 2017-07-20 (×2): qty 3

## 2017-07-20 MED ORDER — EPINEPHRINE PF 1 MG/10ML IJ SOSY
0.5000 mg | PREFILLED_SYRINGE | Freq: Once | INTRAMUSCULAR | Status: AC
  Administered 2017-07-20: 0.5 mg via INTRAVENOUS

## 2017-07-20 MED ORDER — NOREPINEPHRINE 16 MG/250ML-% IV SOLN
0.0000 ug/min | INTRAVENOUS | Status: DC
  Administered 2017-07-20 (×2): 10 ug/min via INTRAVENOUS
  Administered 2017-07-21: 16 ug/min via INTRAVENOUS
  Administered 2017-07-21: 22 ug/min via INTRAVENOUS
  Administered 2017-07-23: 10 ug/min via INTRAVENOUS
  Filled 2017-07-20 (×6): qty 250

## 2017-07-20 MED ORDER — DIGOXIN 0.25 MG/ML IJ SOLN
0.2500 mg | Freq: Once | INTRAMUSCULAR | Status: AC
  Administered 2017-07-20: 0.25 mg via INTRAVENOUS
  Filled 2017-07-20: qty 2

## 2017-07-20 MED ORDER — POTASSIUM CHLORIDE 10 MEQ/50ML IV SOLN
10.0000 meq | INTRAVENOUS | Status: AC
  Administered 2017-07-20 (×4): 10 meq via INTRAVENOUS
  Filled 2017-07-20 (×4): qty 50

## 2017-07-20 MED ORDER — AMIODARONE IV BOLUS ONLY 150 MG/100ML
150.0000 mg | Freq: Once | INTRAVENOUS | Status: AC
  Administered 2017-07-20: 150 mg via INTRAVENOUS

## 2017-07-20 MED ORDER — ORAL CARE MOUTH RINSE
15.0000 mL | Freq: Four times a day (QID) | OROMUCOSAL | Status: DC
  Administered 2017-07-20 – 2017-07-21 (×2): 15 mL via OROMUCOSAL

## 2017-07-20 MED ORDER — PHENYLEPHRINE HCL-NACL 10-0.9 MG/250ML-% IV SOLN: 0.0000 ug/min | INTRAVENOUS | Status: DC

## 2017-07-20 MED ORDER — IOHEXOL 350 MG/ML SOLN
INTRAVENOUS | Status: DC | PRN
  Administered 2017-07-20: 5 mL via INTRA_ARTERIAL

## 2017-07-20 MED ORDER — INSULIN ASPART 100 UNIT/ML ~~LOC~~ SOLN
1.0000 [IU] | SUBCUTANEOUS | Status: DC
  Administered 2017-07-20 (×2): 3 [IU] via SUBCUTANEOUS
  Administered 2017-07-20: 2 [IU] via SUBCUTANEOUS

## 2017-07-20 MED ORDER — SUCCINYLCHOLINE CHLORIDE 20 MG/ML IJ SOLN
100.0000 mg | Freq: Once | INTRAMUSCULAR | Status: AC
  Administered 2017-07-20: 100 mg via INTRAVENOUS

## 2017-07-20 MED ORDER — FAMOTIDINE IN NACL 20-0.9 MG/50ML-% IV SOLN
20.0000 mg | Freq: Two times a day (BID) | INTRAVENOUS | Status: DC
  Administered 2017-07-20 – 2017-07-22 (×6): 20 mg via INTRAVENOUS
  Filled 2017-07-20 (×6): qty 50

## 2017-07-20 MED ORDER — IPRATROPIUM BROMIDE 0.02 % IN SOLN
0.5000 mg | Freq: Four times a day (QID) | RESPIRATORY_TRACT | Status: DC
  Administered 2017-07-20 – 2017-07-25 (×18): 0.5 mg via RESPIRATORY_TRACT
  Filled 2017-07-20 (×19): qty 2.5

## 2017-07-20 MED ORDER — CEFAZOLIN SODIUM-DEXTROSE 2-3 GM-%(50ML) IV SOLR
INTRAVENOUS | Status: AC | PRN
  Administered 2017-07-20: 2 g via INTRAVENOUS

## 2017-07-20 MED ORDER — HEPARIN SODIUM (PORCINE) 5000 UNIT/ML IJ SOLN
50000.0000 [IU] | INTRAVENOUS | Status: DC
  Administered 2017-07-20 – 2017-07-24 (×3): 50000 [IU]
  Filled 2017-07-20 (×5): qty 10

## 2017-07-20 MED ORDER — HEPARIN (PORCINE) IN NACL 1000-0.9 UT/500ML-% IV SOLN
INTRAVENOUS | Status: AC
  Filled 2017-07-20: qty 1000

## 2017-07-20 SURGICAL SUPPLY — 17 items
CATH INFINITI 5FR ANG PIGTAIL (CATHETERS) ×4 IMPLANT
CATH SWAN GANZ 7F STRAIGHT (CATHETERS) ×2 IMPLANT
COVER PRB 48X5XTLSCP FOLD TPE (BAG) ×1 IMPLANT
COVER PROBE 5X48 (BAG) ×1
HOVERMATT SINGLE USE (MISCELLANEOUS) ×2 IMPLANT
KIT ESSENTIALS PG (KITS) ×2 IMPLANT
KIT MICROPUNCTURE NIT STIFF (SHEATH) ×2 IMPLANT
PACK CARDIAC CATHETERIZATION (CUSTOM PROCEDURE TRAY) ×4 IMPLANT
SET IMPELLA CP PUMP (CATHETERS) ×2 IMPLANT
SHEATH PINNACLE 6F 10CM (SHEATH) ×2 IMPLANT
SHEATH PINNACLE 7F 10CM (SHEATH) ×2 IMPLANT
SLEEVE REPOSITIONING LENGTH 30 (MISCELLANEOUS) ×2 IMPLANT
TRANSDUCER W/STOPCOCK (MISCELLANEOUS) ×2 IMPLANT
TUBING ART PRESS 72  MALE/FEM (TUBING) ×1
TUBING ART PRESS 72 MALE/FEM (TUBING) ×1 IMPLANT
WIRE EMERALD 3MM-J .035X150CM (WIRE) ×2 IMPLANT
WIRE G V18X300CM (WIRE) ×2 IMPLANT

## 2017-07-20 NOTE — Progress Notes (Signed)
Patient in cardiogenic shock with florid acute pulmonary edema and now on 40 mcg of levophed. Needed defibrillation x 2 again. I have discussed with the family the need for circulatory support. Appreciate Dr. Jimmey Ralph who has assisted me and took care of acute issues.

## 2017-07-20 NOTE — Progress Notes (Addendum)
PULMONARY / CRITICAL CARE MEDICINE   Name: [name] MRN: [id number] DOB: 03/24/1931    ADMISSION DATE:  07/28/2017 CONSULTATION DATE:  07/22/2017  REFERRING MD:  Dr. Einar Gip  CHIEF COMPLAINT:  Resp failure  HISTORY OF PRESENT ILLNESS:   HPI obtained from medical chart review as patient is obtunded and unable to provide.  84 year old female with PMH of noncompliance, CAD with prior MI in 2015, PAF with questionable compliance with Eliquis, HTN, DM and obesity admitted to Cardiology on 5/18 after presenting near syncope with chest pain.  Found to have anterior STEMI and taken to cath lab where she was found to have occluded LAD which was stented.    On 5/19, she had episode of VF and subsequent episodes of VT requiring multiple shocks.  Was alert and oriented post ROSC.  She was taken emergently back to cath lab on 5/19 but no culprit found.  She was going to be medically managed.  Additionally she developed afib with RVR from her baseline bradycardia requiring amiodarone gtt, metoprolol, and subsequently Cardizem for rate control.   She was doing well until the evening of 5/20 when she became diaphoretic and complained of shortness of breath.  She was placed on NRB and given lasix and then required BiPAP.  However she became obtunded and PCCM consulted for further management.   On preparation for intubation, patient developed vtach with pulse requiring cardioversion and antiarrhythmics.  She subsquently became hypotensive requiring vasopressor support, emergency intubation and central line placement.  Cardiology to bedside and preparing patient to return to cath lab for possible IABP vs impella.     SUBJECTIVE:  Remains in cardiogenic shock, she will open her eyes, follow commands.  Of concern her right greater than left lower extremities cold and mottled. VITAL SIGNS: Blood Pressure (Abnormal) 90/58   Pulse 68   Temperature 98.8 F (37.1 C)   Respiration (Abnormal) 26   Height 5'  5" (1.651 m)   Weight 187 lb 13.3 oz (85.2 kg)   Oxygen Saturation (Abnormal) 0%   Body Mass Index 31.26 kg/m   HEMODYNAMICS:    VENTILATOR SETTINGS: Vent Mode: PRVC FiO2 (%):  [100 %] 100 % Set Rate:  [20 bmp-26 bmp] 26 bmp Vt Set:  [440 mL-500 mL] 440 mL PEEP:  [5 cmH20-12 cmH20] 12 cmH20 Plateau Pressure:  [29 cmH20-32 cmH20] 29 cmH20  INTAKE / OUTPUT: I/O last 3 completed shifts: In: 1107.2 [I.V.:1107.2] Out: 1 [Urine:1]  PHYSICAL EXAMINATION: General: This is a critically ill 84 year old white female currently on multiple ulcers, intra-aortic balloon pump, in in spite of this remains in shock. HEENT normocephalic atraumatic orally intubated no jugular venous distention mucous membranes are moist sclera nonicteric Pulmonary: Equal chest rise with mechanical ventilation Cardiac: Regular irregular no murmur rub or gallop Abdomen: Soft nontender Extremities: Right greater than left lower extremity edema.  The right leg is cold, the catheters including balloon pump and PA catheter both in right groin.  The left lower extremity is cold as well but in comparison warmer than the right.  She is mottled diffusely Neuro: Opens eyes to commands will follow easy commands including sticking out tongue  LABS:  BMET Recent Labs  Lab 07/06/2017 2125 07/19/17 0331 06/30/2017 0215  NA 138 136 138  K 3.8 4.3 3.7  CL 104 103 107  CO2 22 22 21*  BUN 15 14 16   CREATININE 0.94 0.78 0.94  GLUCOSE 165* 190* 222*    Electrolytes Recent Labs  Lab 07/28/2017 2125 07/19/17 0331 07/03/2017 0215 07/09/2017 0216  CALCIUM 9.0 8.7* 7.5*  --   MG 2.0  --  1.8  --   PHOS 2.5  --  4.3 4.4    CBC Recent Labs  Lab 07/30/2017 0311 07/07/2017 2125 07/02/2017 0215 07/06/2017 0600  WBC 12.4* 18.7* 20.6*  --   HGB 12.8 14.0 12.0 13.2  HCT 39.8 43.8 39.2 42.1  PLT 238 267 238  --     Coag's No results for input(s): APTT, INR in the last 168 hours.  Sepsis Markers Recent Labs  Lab 07/29/2017 0215  07/03/2017 0353  LATICACIDVEN  --  7.5*  PROCALCITON 0.27  --     ABG Recent Labs  Lab 07/19/2017 0133 07/08/2017 0302 07/12/2017 0441  PHART 7.201* 7.154* 7.241*  PCO2ART 59.2* 59.3* 46.4  PO2ART 64.0* 57.0* 72.0*    Liver Enzymes Recent Labs  Lab 07/16/2017 1839 07/08/2017 0215  AST 21  --   ALT 19  --   ALKPHOS 77  --   BILITOT 1.6*  --   ALBUMIN 3.7 2.4*    Cardiac Enzymes Recent Labs  Lab 07/27/2017 2125 07/27/2017 0216 07/27/2017 0600  TROPONINI >65.00* 27.06* 25.70*    Glucose Recent Labs  Lab 07/22/2017 2109  GLUCAP 189*    Imaging Ct Head Wo Contrast  Result Date: 07/19/2017 CLINICAL DATA:  Sudden head neck pain. EXAM: CT HEAD WITHOUT CONTRAST TECHNIQUE: Contiguous axial images were obtained from the base of the skull through the vertex without intravenous contrast. COMPARISON:  06/02/2016 FINDINGS: Brain: No evidence of acute infarction, hemorrhage, extra-axial collection, ventriculomegaly, or mass effect. 1.6 cm right frontal subdural extra-axial calcified mass most consistent with a meningioma. Generalized cerebral atrophy. Periventricular white matter low attenuation likely secondary to microangiopathy. Vascular: Cerebrovascular atherosclerotic calcifications are noted. Skull: Negative for fracture or focal lesion. Sinuses/Orbits: Visualized portions of the orbits are unremarkable. Visualized portions of the paranasal sinuses and mastoid air cells are unremarkable. Other: None. IMPRESSION: 1. No acute intracranial pathology. Electronically Signed   By: Kathreen Devoid   On: 07/19/2017 16:51   Dg Chest Port 1 View  Result Date: 06/30/2017 CLINICAL DATA:  Central line placement EXAM: PORTABLE CHEST 1 VIEW COMPARISON:  07/29/2017, 07/16/2017, 07/26/2017, 11/30/2014 FINDINGS: Interval intubation, tip of the endotracheal tube is about 3.9 cm superior to the carina. Esophageal tube tip and side port overlie the proximal to mid stomach. Left-sided central venous catheter tip  overlies the distal SVC. No pneumothorax. Cardiomegaly with vascular congestion. Right greater than left interstitial and alveolar disease with small right-sided pleural effusion. No pneumothorax. IMPRESSION: 1. Left-sided central venous catheter tip overlies the distal SVC. No pneumothorax. 2. Esophageal tube tip and side port overlie the proximal to mid stomach 3. Cardiomegaly with vascular congestion. Extensive right greater than left interstitial and alveolar disease, likely pulmonary edema. Small right pleural effusion Electronically Signed   By: Donavan Foil M.D.   On: 07/23/2017 03:15   Portable Chest X-ray  Result Date: 07/19/2017 CLINICAL DATA:  Initial evaluation status post intubation. EXAM: PORTABLE CHEST 1 VIEW COMPARISON:  Prior radiograph from 07/24/2017 FINDINGS: Endotracheal tube in place with tip well positioned 3.6 cm above the carina. Defibrillator pad overlies the left chest. Stable cardiomegaly. Mediastinal silhouette normal. Lungs normally inflated. Interval worsening in diffuse vascular congestion and interstitial prominence, suggesting moderate pulmonary interstitial edema. Right pleural effusion present. No definite infiltrates, although superimposed infection at the right lung base would be difficult to exclude. No pneumothorax. No  acute osseous abnormality. IMPRESSION: 1. Endotracheal tube well positioned with tip positioned 3.6 cm above the carina. 2. Interval development of moderate diffuse pulmonary interstitial edema with small right pleural effusion. Electronically Signed   By: Jeannine Boga M.D.   On: 07/28/2017 02:26   STUDIES:  5/18 LHC >> prox LAD 100% stenosed with DES placed, LVEDP elevated at 30  5/19 LHC >> stents patent; no culprit found   5/20 TTE >> Left ventricle: The cavity size was normal. There was mild   concentric hypertrophy. The estimated ejection fraction was in   the range of 30% to 35%. Could not assess LV diastolic function   due to  atrial fibrillation. - Regional wall motion abnormality: Akinesis of the apical septal   and apical myocardium; severe hypokinesis of the mid-apical   anterior and mid anteroseptal myocardium. There is also mild   global hypokinesis. GLS severely reduced at -6.9% - Left atrium: The atrium was moderately dilated. - Tricuspid valve: Mildly dilated annulus. There was mild-moderate   regurgitation. - Pulmonary arteries: PA peak pressure: 54 mm Hg (S).  CULTURES: 5/18 MRSA PCR >> neg  ANTIBIOTICS: none  SIGNIFICANT EVENTS: 5/18 Admitted, anterior STEMI/ LHC/ stent 5/19 vfib/ vtach arrest, multiple shocks, LHC 5/21 respiratory distress, vtach, shock, back to cath lab  LINES/TUBES: PIV  5/21 ETT >> 5/21 L [place] CVL >> 5/21 R aline >> 5/21 foley   DISCUSSION: 43 yoF admitted 5/18 with large anterior wall STEMI s/p stent to prox LAD.  On 5/19, went into Vtach/vfib arrest with intact mental status post ROSC, taken back to cath without culprit found with subsequent afib with rvr.   Developed respiratory distress, shock, and recurrent arrhythmias requiring cardioversion during the night 5/20, requiring intubation and vasopressor support.  Patient to return to cath lab for placement of impella per Cards.    ASSESSMENT / PLAN:  Acute hypoxic respiratory failure- related to cardiogenic shock/ pulmonary edema  -Chest x-ray personally reviewed: Demonstrates endotracheal tube as well as central venous catheter satisfactory position.  Diffuse pulmonary infiltrates on chest x-ray consistent with pulmonary edema plan Full ventilator support PAD protocol RASS goal -1 to -2 Repeat chest x-ray in a.m. Maximize cardiac output   Large wall anterior STEMI 5/18 s/p stent to prox LAD Cardiogenic shock Afib w/ RVR Recurrent Vtach - TTE 5/20, EF 30-35%, global hypokinesis, PAP 54 mm Hg -Impella initiated 5/21 -Pressor needs: Plan  Concern about right lower extremity limb ischemia Plan Pending  lower extremity Doppler Not sure what we can do about this is this is the limb with the balloon pump  At risk for AKI given contrast and shock, remains in metabolic acidosis almost certainly lactic acidosis in the setting of refractory shock Plan Continue resuscitation efforts Follow-up ABG To ensure mean arterial pressure greater than 65  Leukocytosis- afebrile Plan Trend CBC   DM Plan Sliding scale insulin  Acute encephalopathy - intact mental status following arrest 5/19 Plan Supportive care RASS negative to  FAMILY  - Updates: Daughters updated at bedside per Dr. Jimmey Ralph  - Inter-disciplinary family meet or Palliative Care meeting due by:  5/28   DVT prophylaxis: heparin gtt SUP H2B Diet: NPO Activity: BR Disposition : ICU critically ill   Erick Colace ACNP-BC [place] Pager # [phone] OR # [phone] if no answer

## 2017-07-20 NOTE — Procedures (Signed)
Central Venous Catheter Insertion Procedure Note  Procedure: Insertion of Central Venous Catheter  Indications:  vascular access, hypovolemia  Procedure Details  Informed consent was obtained for the procedure, including sedation.  Risks of lung perforation, hemorrhage, arrhythmia, and adverse drug reaction were discussed.   Maximum sterile technique was used including antiseptics, cap, gloves, gown, hand hygiene, mask and sheet.  Under sterile conditions the skin above the on the left subclavian vein was prepped with betadine and covered with a sterile drape. Local anesthesia was applied to the skin and subcutaneous tissues. A 22-gauge needle was used to identify the vein. An 18-gauge needle was then inserted into the vein. A guide wire was then passed easily through the catheter. There were no arrhythmias. The catheter was then withdrawn. A triple-lumen was then inserted into the vessel over the guide wire. The catheter was sutured into place.  Findings: There were no changes to vital signs. Catheter was flushed with 10 cc NS. Patient did tolerate procedure well.  Recommendations: CXR ordered to verify placement.

## 2017-07-20 NOTE — Consult Note (Signed)
Hospital Consult    Reason for Consult: Acute right lower extremity ischemia following Impella placement Referring Physician: Dr. Candie Echevaria MRN #:  [id number]  History of Present Illness: This is a 84 y.o. female has a 24 French Impella  in the right common femoral artery after having acute MI and now in cardiogenic shock.  The right lower extremity appears cold and there were no signals.  Vascular was consulted for consideration of revascularization.  Past Medical History:  Diagnosis Date  . Allergic rhinitis due to other allergen 06/14/2008   Qualifier: Diagnosis of  By: Arnoldo Morale MD, Balinda Quails   . Cellulitis and abscess of neck 03/18/2007   Qualifier: Diagnosis of  By: Arnoldo Morale MD, Balinda Quails   . Diabetes mellitus   . Fibrocystic breast disease   . Hypertension   . Meningioma ([place])   . SEBACEOUS CYST, NECK 02/08/2007   Qualifier: Diagnosis of  By: Arnoldo Morale MD, Balinda Quails   . Tendon tear, ankle     Past Surgical History:  Procedure Laterality Date  . BREAST LUMPECTOMY    . CARDIOVERSION N/A 04/18/2013   Procedure: CARDIOVERSION-BEDSIDE;  Surgeon: Laverda Page, MD;  Location: [facility];  Service: Cardiovascular;  Laterality: N/A;  . COLONOSCOPY     05/25/2000. Results: Normal. Hemorrhoids  . CORONARY/GRAFT ACUTE MI REVASCULARIZATION N/A 07/13/2017   Procedure: CORONARY/GRAFT ACUTE MI REVASCULARIZATION;  Surgeon: Jettie Booze, MD;  Location: [facility];  Service: Cardiovascular;  Laterality: N/A;  . LEFT HEART CATH AND CORONARY ANGIOGRAPHY N/A 07/19/2017   Procedure: LEFT HEART CATH AND CORONARY ANGIOGRAPHY;  Surgeon: Adrian Prows, MD;  Location: [place] [facility];  Service: Cardiovascular;  Laterality: N/A;  . LEFT HEART CATH AND CORONARY ANGIOGRAPHY N/A 07/11/2017   Procedure: LEFT HEART CATH AND CORONARY ANGIOGRAPHY;  Surgeon: Jettie Booze, MD;  Location: [place] [facility];  Service: Cardiovascular;  Laterality: N/A;  . LEFT HEART CATHETERIZATION WITH  CORONARY ANGIOGRAM N/A 04/17/2013   Procedure: LEFT HEART CATHETERIZATION WITH CORONARY ANGIOGRAM;  Surgeon: Lorretta Harp, MD;  Location: [facility] CATH LAB;  Service: Cardiovascular;  Laterality: N/A;  . PERCUTANEOUS CORONARY STENT INTERVENTION (PCI-S) N/A 04/18/2013   Procedure: PERCUTANEOUS CORONARY STENT INTERVENTION (PCI-S);  Surgeon: Laverda Page, MD;  Location: [facility] CATH LAB;  Service: Cardiovascular;  Laterality: N/A;  . PERCUTANEOUS CORONARY STENT INTERVENTION (PCI-S) N/A 04/19/2013   Procedure: PERCUTANEOUS CORONARY STENT INTERVENTION (PCI-S);  Surgeon: Laverda Page, MD;  Location: [facility] CATH LAB;  Service: Cardiovascular;  Laterality: N/A;  . RIGHT HEART CATH N/A 07/01/2017   Procedure: RIGHT HEART CATH;  Surgeon: Nigel Mormon, MD;  Location: [place] CV LAB;  Service: Cardiovascular;  Laterality: N/A;  . tendon tear    . TONSILLECTOMY    . VENTRICULAR ASSIST DEVICE INSERTION N/A 07/11/2017   Procedure: VENTRICULAR ASSIST DEVICE INSERTION;  Surgeon: Nigel Mormon, MD;  Location: [place] CV LAB;  Service: Cardiovascular;  Laterality: N/A;    Allergies  Allergen Reactions  . Sulfonamide Derivatives Other (See Comments)    REACTION: childhood unknown type, ended up in hospital    Prior to Admission medications   Medication Sig Start Date End Date Taking? Authorizing Provider  amLODipine (NORVASC) 10 MG tablet Take 10 mg by mouth daily.   Yes [provider]  apixaban (ELIQUIS) 5 MG TABS tablet Take 1 tablet (5 mg total) by mouth 2 (two) times daily. 06/10/17  Yes Marin Olp, MD  aspirin 81 MG tablet Take  81 mg by mouth daily.     Yes [provider]  atenolol (TENORMIN) 100 MG tablet TAKE 1 TABLET BY MOUTH EVERY DAY 10/29/16  Yes Marin Olp, MD  atorvastatin (LIPITOR) 80 MG tablet Take 1 tablet (80 mg total) by mouth daily. 06/10/17  Yes Marin Olp, MD  clindamycin (CLEOCIN) 150 MG capsule Take 150 mg by mouth 4 (four) times  daily.   Yes [provider]  donepezil (ARICEPT) 5 MG tablet Take 5 mg by mouth at bedtime.   Yes [provider]  lisinopril (PRINIVIL,ZESTRIL) 10 MG tablet Take 1 tablet (10 mg total) by mouth daily. Increased dose from 5mg  on 06/25/17 06/25/17  Yes Marin Olp, MD  nitroGLYCERIN (NITROSTAT) 0.4 MG SL tablet Place 0.4 mg under the tongue every 5 (five) minutes as needed for chest pain.   Yes [provider]  omeprazole (PRILOSEC) 20 MG capsule Take 1 capsule (20 mg total) by mouth daily. Patient taking differently: Take 20 mg by mouth daily as needed (reflux).  06/10/17  Yes Marin Olp, MD  donepezil (ARICEPT) 10 MG tablet Take one tablet daily Patient not taking: Reported on 07/29/2017 06/03/17   Cameron Sprang, MD  fluticasone [facility]) 50 MCG/ACT nasal spray Place 2 sprays into both nostrils daily. Patient not taking: Reported on 07/22/2017 06/10/17   Marin Olp, MD    Social History   Socioeconomic History  . Marital status: Widowed    Spouse name: Not on file  . Number of children: Not on file  . Years of education: Not on file  . Highest education level: Not on file  Occupational History  . Not on file  Social Needs  . Financial resource strain: Not on file  . Food insecurity:    Worry: Not on file    Inability: Not on file  . Transportation needs:    Medical: Not on file    Non-medical: Not on file  Tobacco Use  . Smoking status: Never Smoker  . Smokeless tobacco: Never Used  Substance and Sexual Activity  . Alcohol use: No  . Drug use: No  . Sexual activity: Never  Lifestyle  . Physical activity:    Days per week: Not on file    Minutes per session: Not on file  . Stress: Not on file  Relationships  . Social connections:    Talks on phone: Not on file    Gets together: Not on file    Attends religious service: Not on file    Active member of club or organization: Not on file    Attends meetings of clubs or  organizations: Not on file    Relationship status: Not on file  . Intimate partner violence:    Fear of current or ex partner: Not on file    Emotionally abused: Not on file    Physically abused: Not on file    Forced sexual activity: Not on file  Other Topics Concern  . Not on file  Social History Narrative  . Not on file     Family History  Problem Relation Age of Onset  . Stroke Mother   . Heart disease Father     ROS: Not obtained due to patient being critically ill   Physical Examination  Vitals:   07/02/2017 1030 07/15/2017 1045  BP:    Pulse:  67  Resp: (!) 26 (!) 26  Temp: 99 F (37.2 C) 99.1 F (37.3 C)  SpO2:  100%   Body mass index is 31.26 kg/m.  General: Patient is intubated  Abdomen: soft, no masses Extremities: Right lower extremity is mottled there are no signals at the level of the ankle there is no palpable left common femoral pulse   CBC    Component Value Date/Time   WBC 20.6 (H) 07/09/2017 0215   RBC 4.44 07/19/2017 0215   HGB 11.9 (L) 07/04/2017 1026   HCT 35.0 (L) 07/05/2017 1026   PLT 238 07/30/2017 0215   MCV 88.3 07/01/2017 0215   MCV 88.8 04/16/2013 1837   MCH 27.0 07/10/2017 0215   MCHC 30.6 07/15/2017 0215   RDW 13.3 07/19/2017 0215   LYMPHSABS 2.8 07/15/2017 2125   MONOABS 0.9 07/04/2017 2125   EOSABS 0.0 07/12/2017 2125   BASOSABS 0.0 07/06/2017 2125    BMET    Component Value Date/Time   NA 134 (L) 07/15/2017 1026   NA 141 01/04/2015   K 4.2 07/12/2017 1026   CL 107 07/11/2017 0215   CO2 21 (L) 07/10/2017 0215   GLUCOSE 242 (H) 07/04/2017 1026   GLUCOSE 100 11/29/2014 0945   BUN 16 07/08/2017 0215   BUN 10 01/04/2015   CREATININE 0.94 06/30/2017 0215   CREATININE 0.80 05/09/2010 1724   CALCIUM 7.5 (L) 07/27/2017 0215   GFRNONAA 55 (L) 07/10/2017 0215   GFRAA >60 07/14/2017 0215    COAGS: Lab Results  Component Value Date   INR 1.05 04/16/2013   INR 0.99 04/16/2013   INR 0.95 09/12/2010     ASSESSMENT/PLAN: This is a 84 y.o. female with cardiogenic shock and 6 French sheath in the right common femoral artery.  She has acute limb ischemia and is too unstable for operative intervention and will continue to need to 14 French sheath.  Given that there is no common femoral pulse on the left would not recommend switching to that side.  We discussed with him proceeding with ex vivo bypass from the 14 French sheath side arm to the SFA.  Velva Harman Sturdivant was able to demonstrate flow in the distal SFA.  We then elected to place a sheath antegrade through the SFA.  Due to the emergent nature of the procedure we did not obtain consent.  We prepped the right medial thigh with ChloraPrep and placed sterile drapes.  Using ultrasound guidance I then cannulated the SFA antegrade in the mid thigh with a micropuncture needle and placed a wire and sheath.  I did have good backbleeding.  I then placed the J-wire and exchanged for a short 6 [place] sheath.  I did dilate the wire tract considerably.  The sheath was then flushed with saline and was sutured in place with silk suture.  It was then hooked to the side arm of the Impella sheath via a female to female connector.  We then confirmed flow with Doppler through the tubing.  There were no discernible signals at the ankle but the foot immediately began to appear more perfused.  We will watch for development of compartment syndrome.  Marium Ragan C. Donzetta Matters, MD [facility] Office: [phone] Pager: [phone]

## 2017-07-20 NOTE — Progress Notes (Signed)
CTSP due to acute hypotension.  Patient developed AF with RVR earlier in the shift. Amio 300mg  bolus given and drip turned up to 60. Remained in AF with RVR.  Family member came to visit patient and patient became very agitated.   MAPs dropped into 40s. Signal on Impella dropped.   Given 0.5mg  EPI with immediate response. Once epi wore off NE titrated to 19mcg/kg/min.   Patient sedated and underwent DC-CV x 2. First shock not successful. Pads repositioned and second shockk converted to NSR for about 30 seconds but then reverted to AF.   Impella position and numbers stable.    CRITICAL CARE Performed by: Glori Bickers  Total critical care time: 45 minutes (not including DC-CV)  Critical care time was exclusive of separately billable procedures and treating other patients.  Critical care was necessary to treat or prevent imminent or life-threatening deterioration.  Critical care was time spent personally by me (independent of midlevel providers or residents) on the following activities: development of treatment plan with patient and/or surrogate as well as nursing, discussions with consultants, evaluation of patient's response to treatment, examination of patient, obtaining history from patient or surrogate, ordering and performing treatments and interventions, ordering and review of laboratory studies, ordering and review of radiographic studies, pulse oximetry and re-evaluation of patient's condition.  Glori Bickers, MD  5:48 PM

## 2017-07-20 NOTE — Progress Notes (Signed)
Patient refused full dose of Xanax. 0.5 mg given. 0.5 mg wasted with Corena Pilgrim.

## 2017-07-20 NOTE — Procedures (Signed)
Arterial Catheter Insertion Procedure Note ATZIRI ZUBIATE [id number] 08/30/1931  Procedure: Insertion of Arterial Catheter  Indications: Blood pressure monitoring and Frequent blood sampling  Procedure Details Consent: Unable to obtain consent because of emergent medical necessity. Time Out: Verified patient identification, verified procedure, site/side was marked, verified correct patient position, special equipment/implants available, medications/allergies/relevent history reviewed, required imaging and test results available.  Performed  Maximum sterile technique was used including antiseptics, cap, gloves, gown, hand hygiene, mask and sheet. Skin prep: Chlorhexidine; local anesthetic administered 20 gauge catheter was inserted into right radial artery using the Seldinger technique. ULTRASOUND GUIDANCE USED: YES Evaluation Blood flow good; BP tracing good. Complications: No apparent complications.  Kennieth Rad, AGACNP-BC [facility] Pgr: [phone] or if no answer [phone] 07/24/2017, 2:59 AM

## 2017-07-20 NOTE — Progress Notes (Signed)
Patient after the procedure developed hypotension, and during trying to shift the patient to the  bed for the table, it was noted to have a very large right groin hematoma associated with continuous arterial bleeding.  Manual pressure was held.  The patient was reprepped in a sterile fashion, the Impella had to be withdrawn out of the body and reintroduced leaving the peel-away sheath in place to achieve hemostasis.  Patient immediately stabilized.  Levophed had been turned on at 20 mcg, reduced to 10 mcg, hopefully she will be able to be weaned off.  Patient received prophylactic Ancef 2 g.  We will continue to monitor her groin site and also any sepsis.  She will need close monitoring of H&H and may need blood transfusion.  Follow-up right heart catheterization pressures once patient's transfer to CCU is complete.

## 2017-07-20 NOTE — Progress Notes (Signed)
Patient placed on Bi-pap FiO2 at 100% O2 saturation remains in the upper 80's. Blood gas was drawn. MD notified and wants to proceed with intubation and consult CCM.

## 2017-07-20 NOTE — Progress Notes (Addendum)
Subjective:  Intubated, sedated  Interval events: Cardiogenic shock, pulmonary edema, Afib w/RVR, followed by NSVT on 05/20 late evening. She had to be shocked X1 for VT. Taken emergently to cath lab for impella placement. Profuse bleeding noticed at Impella site after returning to 2 Heart along with increasing requirement of norepinephrine. Patient, had to be taken back to cath lab to replace Impella with 54F sheath left in place to achieve hemostasis. Given 1 U PRBC. Required repositioning of the Impella a few times to achieve optimal flow. Given 250 cc NS bolus, and started on milrinone to help with afterload reduction. Needed distal perfusion sheath in Lt SFA due to mottling of RLE.  Objective:  Vital Signs in the last 24 hours: Temp:  [97.6 F (36.4 C)] 97.6 F (36.4 C) (05/20 1952) Pulse Rate:  [0-147] 0 (05/21 0728) Resp:  [0-75] 0 (05/21 0728) BP: (73-176)/(12-126) 90/58 (05/21 0706) SpO2:  [0 %-99 %] 0 % (05/21 0728) FiO2 (%):  [100 %] 100 % (05/21 0757)  Intake/Output from previous day: 05/20 0701 - 05/21 0700 In: 287.2 [I.V.:287.2] Out: 1 [Urine:1] Intake/Output from this shift: No intake/output data recorded.  Physical Exam: Constitutional: Intubated, sedated HENT:  Head: Atraumatic.  Eyes: Conjunctivae are normal.  Neck: Neck supple.  Cardiovascular:  Tachycardic, S1 normal, S2 normal, no murmur appreciated.  Right radial cath site with no bleeding, hematoma. Rt femoral Impella with 54F sheath. Rt CFV Swan catheter Extremities. Both extremities cool,improved perfusion in RLE after distal perfusion sheath placement in Lef prox SFA. Right thigh hematoma. Stable. Pulmonary/Chest: Effort normal and breath sounds reduced at bases.  Abdominal: Soft.  Musculoskeletal: Normal range of motion.  Neurological: She is alert and oriented to person, place, and time.  Skin: Skin is warm and dry.     Lab Results: Recent Labs    07/05/2017 2125 07/12/2017 0215 07/03/2017 0600   WBC 18.7* 20.6*  --   HGB 14.0 12.0 13.2  PLT 267 238  --    Recent Labs    07/19/17 0331 07/01/2017 0215  NA 136 138  K 4.3 3.7  CL 103 107  CO2 22 21*  GLUCOSE 190* 222*  BUN 14 16  CREATININE 0.78 0.94   Recent Labs    07/07/2017 0216 07/07/2017 0600  TROPONINI 27.06* 25.70*   Hepatic Function Panel Recent Labs    07/16/2017 1839 07/05/2017 0215  PROT 6.5  --   ALBUMIN 3.7 2.4*  AST 21  --   ALT 19  --   ALKPHOS 77  --   BILITOT 1.6*  --    Recent Labs    07/16/2017 0311  CHOL 224*   Imaging: CXR 07/11/2017: CLINICAL DATA:  Cough  EXAM: PORTABLE CHEST 1 VIEW  COMPARISON:  07/07/2017, 11/30/2014  FINDINGS: Asymmetrical right greater than left interstitial opacity, slight decreased compared to prior. Cardiomegaly. Aortic atherosclerosis. No pleural effusion or pneumothorax.  IMPRESSION: Asymmetrical right greater than left interstitial opacity, possible asymmetric edema. Cardiomegaly.   Cardiac Studies: EKG 07/15/2017: A. fib with RVR at the rate of 130 bpm, ST elevation in 1 and aVL and V3 to V6, new since EKG performed earlier this morning patient was in sinus rhythm with poor R wave progression and lateral T wave inversion.  Limited echocardiogram 07/06/2017: Left ventricle:  Moderate diffuse hypokinesis with severe mid to distal LAD territory hypokinesis. Apical dyskinesis. LVEF 15-20%. better visuaulization of LV apex compared to previous study on 07/19/2017. LVEF appears to have decreased further. No LV thrombus  seen on non-contrast study. Impella catheter seen in LV cavity, measuring 5 cm from distal tip to aortic valve. Repositioned, now measures 4.1 cm from aortic valve.   Echocardiogram 07/19/2017: Study Conclusions  - Left ventricle: The cavity size was normal. There was mild   concentric hypertrophy. The estimated ejection fraction was in   the range of 30% to 35%. Could not assess LV diastolic function   due to atrial  fibrillation. - Regional wall motion abnormality: Akinesis of the apical septal   and apical myocardium; severe hypokinesis of the mid-apical   anterior and mid anteroseptal myocardium. There is also mild   global hypokinesis. GLS severely reduced at -6.9% - Left atrium: The atrium was moderately dilated. - Tricuspid valve: Mildly dilated annulus. There was mild-moderate   regurgitation. - Pulmonary arteries: PA peak pressure: 54 mm Hg (S).   Outpatient echocardiogram 09/24/2015: Left ventricle cavity is normal in size. Mild concentric hypertrophy of the left ventricle. Normal global wall motion. Doppler evidence of grade I (impaired) diastolic dysfunction. Calculated EF 63%. Trace mitral regurgitation. Mild calcification of the mitral valve annulus. Trace tricuspid regurgitation. Unable to estimate PA pressure due to absence/minimal TR signal. Overall no change in study since 12/26/2013.   07/29/2017: Procedures   CORONARY/GRAFT ACUTE MI REVASCULARIZATION  LEFT HEART CATH AND CORONARY ANGIOGRAPHY  Conclusion     Previously placed Prox LAD to Mid LAD stent (unknown type) is widely patent.  Prox Cx lesion is 25% stenosed.  Prox RCA to Dist RCA lesion is 20% stenosed.  Post Atrio lesion is 75% stenosed.  LV end diastolic pressure is moderately elevated.  There is no aortic valve stenosis.  Prox LAD lesion is 100% stenosed.  A drug-eluting stent was successfully placed using a STENT SYNERGY DES 3.5X20.  Post intervention, there is a 0% residual stenosis.  1st Diag lesion is 100% stenosed. The occlusion is very distal.   Recommend uninterrupted dual antiplatelet therapy with Aspirin 80m QD and Ticagrelor 93mBID for a minimum of 12 months (ACS - Class I recommendation).   The patient was on Eliquis, but compliance was an issue.  She had apparently not taken any for a few days prior to the MI. If it is decided that Eliquis is a good idea, then the duration of  antiplatelet therapy would have to be reduced.    She will need an echo to evaluate LV function.  I suspect that her LV function will be significantly decreased.   Holding OV fluids due to elevated LVEDP.     06/30/2017: Procedures   LEFT HEART CATH AND CORONARY ANGIOGRAPHY  Conclusion   Coronary angiogram 07/27/2017: Widely patent prior stents including  S/P stenting of proximal LAD with 3.5 x 20 mm Synergy on 07/30/2017. History of non-STEMI and PTCA with overlapping stents on 04/18/2013 with implantation of a 3.0 x 15 mm and 3.0 x 38 mm Xience DES in the proximal and mid LAD and staged intervention to the right coronary artery with implantation of a 3.0 x 38 mm x 2 DES. The diagonal 1 branch which is occluded due to thrombotic embolic complication, was widely patent with TIMI-3 flow with no residual stenosis.  Right coronary artery in-stent 30 to 40% stenosis, PL branch has a 70 to 75% stenosis.  Not culprit for acute presentation.  Recommendation: Suspect her VF arrest and sustained VT, symptomatic needing defibrillation was related to acute anterior myocardial infarction.  We will continue medical therapy for now.  Patient has baseline bradycardia  and has not been able to tolerate beta-blockers.  However she is now in A. fib with RVR, I will start her on metoprolol IV 5 mg every 6 hours to control her heart rate.  I suspect she may end up needing a permanent pacemaker.  I have met with the family and discussed in detail.     07/28/2017: Procedures   RIGHT HEART CATH  VENTRICULAR ASSIST DEVICE INSERTION  Conclusion   Post MI cardiogenic shock Elevated LVEDP, PAWP Successful Impella CP placement  Recommendation: Continue monitoring in ICU Continue norepinephrine for now IV lasix 40 mg bid. Adjust as necessary Serial lactic acid, echocardiogram Strict I/O Stop apixaban. Continue heparin infusion Continue ASA/ticagrelor    Assessment: 84 y/o Caucasian  female Cardiogenic shock: S/p Large anterior wall MI 06/30/2017 S/p Impella CP placement 06/30/2017 Repositioning of Impella catheter under ultrasound guidance.07/23/2017 Lactic acid elevation: Improving RLE iscehmia: combination of vasoconstriction 2/2 cardiogenic shock, pressors, and Impella placement. Now has distal repositioning sheath placed by Dr. Donzetta Matters. Improved perfusion. Monitor for compartment syndrome. Right thigh hematoma: Stable. Received 1 U PRBC on 07/16/2017/ S/p primary PCI to mid LAD 07/14/2017 Synergy DES 3.5 X 20 mm DES VF arrest 07/22/2017: Coronary angiogram with no acute stent thrombosis Paroxysmal atrial fibrillation, currently in RVR. CHA2DS2VASc score 5 Type 2 DM H/o meningioma  Recommendations: Critically ill patient with high risk for mortality. Continue hemodynamic support with Impella CP. Attempt to wean off norepinephrine, if possible. Continue IV milrinone, uptitrate as necessary. Maintain CVP 12-14 Evaluated by Dr. Prescott Gum. Not a candidate for uptitration to Impella 5.0 given age, multiple comorbidities, and bleeding risk. Serial measurement of lactic acid Strict I/O 1 PRBC transfusion given profuse bleeding, cardiogenic shock,  Continue ASA/ticagrelor/heparin. Continue Lipitor 80 mg Continue IV amiodarone  I have discussed patient's medical condition, prognosis in detail with the family multiple times through the day. High risk mortality due to large anterior wall MI, VF arrest, cardiogenic shock, RLE ischemia. Mortality >40%. Family understand the prognosis and would like [place] to continue all aggressive measures at this time.   Appreciate input from Dr. Haroldine Laws, Dr. Prescott Gum, and Dr. Donzetta Matters (Vascular surgery) Appreciate PCCM support.   LOS: 3 days    [name] 07/24/2017, 8:04 AM  [place], MD [facility] Pager: [phone] Office: [phone] If no answer Cell [phone]

## 2017-07-20 NOTE — Consult Note (Signed)
PULMONARY / CRITICAL CARE MEDICINE   Name: [name] MRN: [id number] DOB: 20-Aug-1931    ADMISSION DATE:  06/30/2017 CONSULTATION DATE:  07/02/2017  REFERRING MD:  Dr. Einar Gip  CHIEF COMPLAINT:  Resp failure  HISTORY OF PRESENT ILLNESS:   HPI obtained from medical chart review as patient is obtunded and unable to provide.  84 year old female with PMH of noncompliance, CAD with prior MI in 2015, PAF with questionable compliance with Eliquis, HTN, DM and obesity admitted to Cardiology on 5/18 after presenting near syncope with chest pain.  Found to have anterior STEMI and taken to cath lab where she was found to have occluded LAD which was stented.    On 5/19, she had episode of VF and subsequent episodes of VT requiring multiple shocks.  Was alert and oriented post ROSC.  She was taken emergently back to cath lab on 5/19 but no culprit found.  She was going to be medically managed.  Additionally she developed afib with RVR from her baseline bradycardia requiring amiodarone gtt, metoprolol, and subsequently Cardizem for rate control.   She was doing well until the evening of 5/20 when she became diaphoretic and complained of shortness of breath.  She was placed on NRB and given lasix and then required BiPAP.  However she became obtunded and PCCM consulted for further management.   On preparation for intubation, patient developed vtach with pulse requiring cardioversion and antiarrhythmics.  She subsquently became hypotensive requiring vasopressor support, emergency intubation and central line placement.  Cardiology to bedside and preparing patient to return to cath lab for possible IABP vs impella.    PAST MEDICAL HISTORY :  She  has a past medical history of Allergic rhinitis due to other allergen (06/14/2008), Cellulitis and abscess of neck (03/18/2007), Diabetes mellitus, Fibrocystic breast disease, Hypertension, Meningioma ([place]), SEBACEOUS CYST, NECK (02/08/2007), and Tendon tear,  ankle.  PAST SURGICAL HISTORY: She  has a past surgical history that includes Breast lumpectomy; Tonsillectomy; tendon tear; Colonoscopy; Cardioversion (N/A, 04/18/2013); left heart catheterization with coronary angiogram (N/A, 04/17/2013); percutaneous coronary stent intervention (pci-s) (N/A, 04/18/2013); percutaneous coronary stent intervention (pci-s) (N/A, 04/19/2013); LEFT HEART CATH AND CORONARY ANGIOGRAPHY (N/A, 07/12/2017); Coronary/Graft Acute MI Revascularization (N/A, 07/09/2017); and LEFT HEART CATH AND CORONARY ANGIOGRAPHY (N/A, 07/03/2017).  Allergies  Allergen Reactions  . Sulfonamide Derivatives Other (See Comments)    REACTION: childhood unknown type, ended up in hospital    No current facility-administered medications on file prior to encounter.    Current Outpatient Medications on File Prior to Encounter  Medication Sig  . amLODipine (NORVASC) 10 MG tablet Take 10 mg by mouth daily.  Marland Kitchen apixaban (ELIQUIS) 5 MG TABS tablet Take 1 tablet (5 mg total) by mouth 2 (two) times daily.  Marland Kitchen aspirin 81 MG tablet Take 81 mg by mouth daily.    Marland Kitchen atenolol (TENORMIN) 100 MG tablet TAKE 1 TABLET BY MOUTH EVERY DAY  . atorvastatin (LIPITOR) 80 MG tablet Take 1 tablet (80 mg total) by mouth daily.  . clindamycin (CLEOCIN) 150 MG capsule Take 150 mg by mouth 4 (four) times daily.  Marland Kitchen donepezil (ARICEPT) 5 MG tablet Take 5 mg by mouth at bedtime.  Marland Kitchen lisinopril (PRINIVIL,ZESTRIL) 10 MG tablet Take 1 tablet (10 mg total) by mouth daily. Increased dose from 5mg  on 06/25/17  . nitroGLYCERIN (NITROSTAT) 0.4 MG SL tablet Place 0.4 mg under the tongue every 5 (five) minutes as needed for chest pain.  Marland Kitchen omeprazole (PRILOSEC) 20 MG capsule Take  1 capsule (20 mg total) by mouth daily. (Patient taking differently: Take 20 mg by mouth daily as needed (reflux). )  . donepezil (ARICEPT) 10 MG tablet Take one tablet daily (Patient not taking: Reported on 07/24/2017)  . fluticasone (FLONASE) 50 MCG/ACT nasal spray  Place 2 sprays into both nostrils daily. (Patient not taking: Reported on 07/26/2017)    FAMILY HISTORY:  Her indicated that her mother is deceased. She indicated that her father is deceased.   SOCIAL HISTORY: She  reports that she has never smoked. She has never used smokeless tobacco. She reports that she does not drink alcohol or use drugs.  REVIEW OF SYSTEMS:   Unable to assess as patient is obtunded.   SUBJECTIVE:   VITAL SIGNS: BP 119/75   Pulse 83   Temp 97.6 F (36.4 C) (Oral)   Resp (!) 25   Wt 187 lb 13.3 oz (85.2 kg)   SpO2 99%   BMI 33.27 kg/m   HEMODYNAMICS:    VENTILATOR SETTINGS: Vent Mode: PRVC FiO2 (%):  [100 %] 100 % Set Rate:  [20 bmp] 20 bmp Vt Set:  [440 mL-500 mL] 440 mL PEEP:  [5 cmH20] 5 cmH20 Plateau Pressure:  [32 cmH20] 32 cmH20  INTAKE / OUTPUT: I/O last 3 completed shifts: In: 1040.4 [I.V.:1040.4] Out: 1 [Urine:1]  PHYSICAL EXAMINATION: General:  Critically ill elderly female in NAD HEENT: MM pale, ETT/ OGT Neuro: sedated CV: IRIR, +1 pulse PULM: even/non-labored on MV UM:PNTIR, soft, bs + Extremities: cool/ dry, mildly cyanotic fingers/toes Skin: no rashes   LABS:  BMET Recent Labs  Lab 07/15/2017 0311 07/24/2017 2125 07/19/17 0331  NA 139 138 136  K 4.5 3.8 4.3  CL 108 104 103  CO2 23 22 22   BUN 12 15 14   CREATININE 0.84 0.94 0.78  GLUCOSE 172* 165* 190*    Electrolytes Recent Labs  Lab 07/12/2017 0311 07/27/2017 2125 07/19/17 0331  CALCIUM 8.4* 9.0 8.7*  MG  --  2.0  --   PHOS  --  2.5  --     CBC Recent Labs  Lab 07/26/2017 0311 07/01/2017 2125 07/26/2017 0215  WBC 12.4* 18.7* 20.6*  HGB 12.8 14.0 12.0  HCT 39.8 43.8 39.2  PLT 238 267 238    Coag's No results for input(s): APTT, INR in the last 168 hours.  Sepsis Markers No results for input(s): LATICACIDVEN, PROCALCITON, O2SATVEN in the last 168 hours.  ABG Recent Labs  Lab 07/22/2017 2115  PHART 7.358  PCO2ART 39.6  PO2ART 267.0*    Liver  Enzymes Recent Labs  Lab 07/19/2017 1839  AST 21  ALT 19  ALKPHOS 77  BILITOT 1.6*  ALBUMIN 3.7    Cardiac Enzymes Recent Labs  Lab 07/24/2017 0311 07/22/2017 0938 07/09/2017 2125  TROPONINI >65.00* >65.00* >65.00*    Glucose Recent Labs  Lab 07/01/2017 2109  GLUCAP 189*    Imaging Ct Head Wo Contrast  Result Date: 07/19/2017 CLINICAL DATA:  Sudden head neck pain. EXAM: CT HEAD WITHOUT CONTRAST TECHNIQUE: Contiguous axial images were obtained from the base of the skull through the vertex without intravenous contrast. COMPARISON:  06/02/2016 FINDINGS: Brain: No evidence of acute infarction, hemorrhage, extra-axial collection, ventriculomegaly, or mass effect. 1.6 cm right frontal subdural extra-axial calcified mass most consistent with a meningioma. Generalized cerebral atrophy. Periventricular white matter low attenuation likely secondary to microangiopathy. Vascular: Cerebrovascular atherosclerotic calcifications are noted. Skull: Negative for fracture or focal lesion. Sinuses/Orbits: Visualized portions of the orbits are unremarkable. Visualized  portions of the paranasal sinuses and mastoid air cells are unremarkable. Other: None. IMPRESSION: 1. No acute intracranial pathology. Electronically Signed   By: Kathreen Devoid   On: 07/19/2017 16:51   Portable Chest X-ray  Result Date: 07/09/2017 CLINICAL DATA:  Initial evaluation status post intubation. EXAM: PORTABLE CHEST 1 VIEW COMPARISON:  Prior radiograph from 07/26/2017 FINDINGS: Endotracheal tube in place with tip well positioned 3.6 cm above the carina. Defibrillator pad overlies the left chest. Stable cardiomegaly. Mediastinal silhouette normal. Lungs normally inflated. Interval worsening in diffuse vascular congestion and interstitial prominence, suggesting moderate pulmonary interstitial edema. Right pleural effusion present. No definite infiltrates, although superimposed infection at the right lung base would be difficult to exclude. No  pneumothorax. No acute osseous abnormality. IMPRESSION: 1. Endotracheal tube well positioned with tip positioned 3.6 cm above the carina. 2. Interval development of moderate diffuse pulmonary interstitial edema with small right pleural effusion. Electronically Signed   By: Jeannine Boga M.D.   On: 07/04/2017 02:26   STUDIES:  5/18 LHC >> prox LAD 100% stenosed with DES placed, LVEDP elevated at 30  5/19 LHC >> stents patent; no culprit found   5/20 TTE >> Left ventricle: The cavity size was normal. There was mild   concentric hypertrophy. The estimated ejection fraction was in   the range of 30% to 35%. Could not assess LV diastolic function   due to atrial fibrillation. - Regional wall motion abnormality: Akinesis of the apical septal   and apical myocardium; severe hypokinesis of the mid-apical   anterior and mid anteroseptal myocardium. There is also mild   global hypokinesis. GLS severely reduced at -6.9% - Left atrium: The atrium was moderately dilated. - Tricuspid valve: Mildly dilated annulus. There was mild-moderate   regurgitation. - Pulmonary arteries: PA peak pressure: 54 mm Hg (S).  CULTURES: 5/18 MRSA PCR >> neg  ANTIBIOTICS: none  SIGNIFICANT EVENTS: 5/18 Admitted, anterior STEMI/ LHC/ stent 5/19 vfib/ vtach arrest, multiple shocks, LHC 5/21 respiratory distress, vtach, shock, back to cath lab  LINES/TUBES: PIV  5/21 ETT >> 5/21 L [place] CVL >> 5/21 R aline >> 5/21 foley   DISCUSSION: 51 yoF admitted 5/18 with large anterior wall STEMI s/p stent to prox LAD.  On 5/19, went into Vtach/vfib arrest with intact mental status post ROSC, taken back to cath without culprit found with subsequent afib with rvr.   Developed respiratory distress, shock, and recurrent arrhythmias requiring cardioversion during the night 5/20, requiring intubation and vasopressor support.  Patient to return to cath lab for placement of impella per Cards.    ASSESSMENT /  PLAN:  PULMONARY A: Acute hypoxic respiratory failure- related to cardiogenic shock/ pulmonary edema  P:   S/p intubation Full MV support, PRVC ABG noted, rate adjusted to 26 Trend CXR and ABG VAP protocol PPI for SUP BD prn  S/p lasix 40mg    CARDIOVASCULAR A:  Large wall anterior STEMI 5/18 s/p stent to prox LAD Cardiogenic shock Afib w/ RVR Recurrent Vtach - TTE 5/20, EF 30-35%, global hypokinesis, PAP 54 mm Hg P:  Tele monitoring Per cards- going to cath for impella, Dr. Einar Gip at bedside S/p Aline and CVL placement Continue levophed and vasopressin for goal MAP > 65 Continue lidocaine gtt for now, defer further to cards D/c eliquis as she will require heparin Correct electrolytes as needed Assess cortisol and co-ox   RENAL A:   At risk for AKI given contrast and shock Goal K > 4, mag > 2 with  arrhythmias  P:   D/c IVF Insert foley  KCL x 4 runs, mag 2gm and calcium gluconate 1 gm now Trend BMP / urinary output Replace electrolytes as indicated  GASTROINTESTINAL A:   NPO P:   PPI for SUP Consider TF in am  NPO Bowel regimen per PAD protocol . LFT in am   HEMATOLOGIC A:   Leukocytosis- ? Reactive from large MI P:  Trend CBC Heparin systemically  Transfuse for Hgb < 8 given cardiac ischemia   INFECTIOUS A:   Leukocytosis- afebrile P:   Monitor clinically Assess PCT for completeness  Trend WBC/ fever curve  ENDOCRINE A:   DM P:   CBG q 4 SSI   NEUROLOGIC A:   Acute encephalopathy - intact mental status following arrest 5/19 P:   RASS goal: -1 PAD protocol with fentanyl gtt and prn versed Daily sedation holiday as able Monitor neuro status   FAMILY  - Updates: Daughters updated at bedside per Dr. Jimmey Ralph  - Inter-disciplinary family meet or Palliative Care meeting due by:  5/28  Kennieth Rad, AGACNP-BC [place] Pgr: [phone] or if no answer [phone] 07/26/2017, 3:37 AM

## 2017-07-20 NOTE — Progress Notes (Signed)
  Echocardiogram 2D Echocardiogram has been performed.  Johny Chess 07/15/2017, 11:38 AM

## 2017-07-20 NOTE — Progress Notes (Signed)
Patient complaining of shortness of breath and is diaphoretic. 02 stat has decreased. Patient placed on non-re breather. Dr. Einar Gip made aware and gave verbal orders for 40 of lasix. Will continue to monitor closely.

## 2017-07-20 NOTE — Progress Notes (Signed)
Day of Surgery Procedure(s) (LRB): VENTRICULAR ASSIST DEVICE INSERTION (N/A) RIGHT HEART CATH (N/A) Subjective: Patient examined, most recent echocardiogram and chest x-ray and coronary angiogram images personally reviewed.  84 year old presented with acute completed anterior MI on May 18 and was treated successfully with PCI.  Within 48 hours she developed cardiogenic shock, the PCI was patent, and a right femoral CP percutaneous LVAD was placed.  This was switched out with a second catheter which is providing 2.4 L of flow and currently the patient s only on low-dose l norepi and milrinone.  Serum lactate levels are improving.  A distal perfusion 6 French sheath was placed into the right SFA and now the right leg perfusion is improved.  Patient is on Brilinta and heparin.  I would not recommend placement of a new Impella 5.0 catheter via cutdown on the right axillary artery because the patient is currently stable with the current percutaneous perfusion set up.  A larger surgical site for a new Impella 5.0 would have a high risk of significant bleeding and the current femoral artery Impella catheter would need to be removed from the right groin and would probably require surgical repair with another large open incision.   Objective: Vital signs in last 24 hours: Temp:  [97.6 F (36.4 C)-99.5 F (37.5 C)] 99.1 F (37.3 C) (05/21 1045) Pulse Rate:  [0-147] 67 (05/21 1045) Cardiac Rhythm: Normal sinus rhythm (05/21 0830) Resp:  [0-75] 26 (05/21 1045) BP: (53-176)/(12-126) 53/23 (05/21 0945) SpO2:  [0 %-100 %] 100 % (05/21 1100) FiO2 (%):  [70 %-100 %] 70 % (05/21 1100)  Hemodynamic parameters for last 24 hours:    Intake/Output from previous day: 05/20 0701 - 05/21 0700 In: 287.2 [I.V.:287.2] Out: 1 [Urine:1] Intake/Output this shift: Total I/O In: 315 [Blood:315] Out: -   Elderly female patient sedated intubated No pulsatility from native LV Vascular catheter in right groin with  large hematoma Extremities cool but perfused  Lab Results: Recent Labs    07/16/2017 2125 07/19/2017 0215 06/30/2017 0600 07/15/2017 1026  WBC 18.7* 20.6*  --   --   HGB 14.0 12.0 13.2 11.9*  HCT 43.8 39.2 42.1 35.0*  PLT 267 238  --   --    BMET:  Recent Labs    07/23/2017 0215 07/05/2017 0955 07/22/2017 1026  NA 138 133* 134*  K 3.7 4.1 4.2  CL 107 104  --   CO2 21* 21*  --   GLUCOSE 222* 256* 242*  BUN 16 19  --   CREATININE 0.94 0.98  --   CALCIUM 7.5* 7.7*  --     PT/INR: No results for input(s): LABPROT, INR in the last 72 hours. ABG    Component Value Date/Time   PHART 7.345 (L) 07/02/2017 1038   HCO3 19.2 (L) 07/12/2017 1038   TCO2 20 (L) 07/24/2017 1038   ACIDBASEDEF 6.0 (H) 07/27/2017 1038   O2SAT 99.0 07/02/2017 1038   CBG (last 3)  Recent Labs    07/09/2017 2109  GLUCAP 189*    Assessment/Plan: S/P Procedure(s) (LRB): VENTRICULAR ASSIST DEVICE INSERTION (N/A) RIGHT HEART CATH (N/A) Would not recommend placement of Impella 5.0 thru  new surgical incision because the patient is currently stable with her current perfusion device and the surgical incisions associated with placing a new device and removal of her current device would expose her to even more serious problems with bleeding.   LOS: 3 days    [name] 07/14/2017

## 2017-07-20 NOTE — Procedures (Signed)
Endotracheal Intubation Procedure Note Indication for endotracheal intubation: impending respiratory failure Sedation: etomidate and midazolam Paralytic: succinylcholine Equipment: Macintosh 3 laryngoscope blade and 7.58mm cuffed endotracheal tube Cricoid Pressure: yes Number of attempts: 1 ETT location confirmed by by auscultation, by CXR and ETCO2 monitor.

## 2017-07-20 NOTE — Care Management Note (Signed)
Case Management Note  Patient Details  Name: [name] MRN: [id number] Date of Birth: 03/28/1931  Subjective/Objective:  Pt admitted with MI                  Action/Plan:   PTA independent from home alone.  Pt will discharge to daughters home to allow for additional recovery prior to returning home.  Pt has PCP and is able to afford prescription medications.  Per daughters - pt may benefit from [facility] to assist with medication management - CM requested order.  CM submitted benefit check for Brilinta - CM will provide free 30 card prior to discharge.  CM will continue to follow for discharge needs   Expected Discharge Date:                  Expected Discharge Plan:  [place]  In-House Referral:     Discharge planning Services  CM Consult  Post Acute Care Choice:    Choice offered to:     DME Arranged:    DME Agency:     HH Arranged:    HH Agency:     Status of Service:     If discussed at [employer], dates discussed:    Additional Comments 07/08/2017  Pt unfortunately requiring intubation and pressors overnight.  CM left Life Vest form on pts shadow chart for physician to fill out once approrpiate   07/19/17 Update:  Pt will discharge home with life vest, form placed on shadow chart for physician to complete.  Life Vest aware of pending referral Oneal Grout 07/01/2017, 11:25 AM

## 2017-07-20 NOTE — Progress Notes (Signed)
Amiodarone Drug - Drug Interaction Consult Note  Recommendations: Continue current management with amiodarone infusion. Monitor vitals and drug interactions.  Amiodarone is metabolized by the cytochrome P450 system and therefore has the potential to cause many drug interactions. Amiodarone has an average plasma half-life of 50 days (range 20 to 100 days).   There is potential for drug interactions to occur several weeks or months after stopping treatment and the onset of drug interactions may be slow after initiating amiodarone.   [x]  Statins: Increased risk of myopathy. Simvastatin- restrict dose to 20mg  daily. Other statins: counsel patients to report any muscle pain or weakness immediately.  []  Anticoagulants: Amiodarone can increase anticoagulant effect. Consider warfarin dose reduction. Patients should be monitored closely and the dose of anticoagulant altered accordingly, remembering that amiodarone levels take several weeks to stabilize.  []  Antiepileptics: Amiodarone can increase plasma concentration of phenytoin, the dose should be reduced. Note that small changes in phenytoin dose can result in large changes in levels. Monitor patient and counsel on signs of toxicity.  []  Beta blockers: increased risk of bradycardia, AV block and myocardial depression. Sotalol - avoid concomitant use.  []   Calcium channel blockers (diltiazem and verapamil): increased risk of bradycardia, AV block and myocardial depression.  []   Cyclosporine: Amiodarone increases levels of cyclosporine. Reduced dose of cyclosporine is recommended.  []  Digoxin dose should be halved when amiodarone is started.  [x]  Diuretics: increased risk of cardiotoxicity if hypokalemia occurs.  []  Oral hypoglycemic agents (glyburide, glipizide, glimepiride): increased risk of hypoglycemia. Patient's glucose levels should be monitored closely when initiating amiodarone therapy.   []  Drugs that prolong the QT interval:  Torsades  de pointes risk may be increased with concurrent use - avoid if possible.  Monitor QTc, also keep magnesium/potassium WNL if concurrent therapy can't be avoided. Marland Kitchen Antibiotics: e.g. fluoroquinolones, erythromycin. . Antiarrhythmics: e.g. quinidine, procainamide, disopyramide, sotalol. . Antipsychotics: e.g. phenothiazines, haloperidol.  . Lithium, tricyclic antidepressants, and methadone.  Thank You,  Betsey Holiday  07/18/2017 9:47 AM

## 2017-07-20 NOTE — Progress Notes (Signed)
Returned to patient's room after 20 minutes. Patient was obtunded. Patient was previously alert and oriented. Patient's O2 saturation began to drop despite the non-rebreather and was diaphoretic. Patient would slightly arouse when addressed loudly. Dr Einar Gip was paged and notified of the changes. RN requested a blood gas and Dr Nadyne Coombes wanted to hold off and attempt Bi-pap. RT notified of new orders. Will continue to monitor closely.

## 2017-07-20 NOTE — Progress Notes (Signed)
VASCULAR SURGERY:  Still no Doppler signals in the right foot although the color has improved.  No evidence of compartment syndrome.  Deitra Mayo, MD, [place] [phone] Office: [phone]

## 2017-07-20 NOTE — CV Procedure (Signed)
     DIRECT CURRENT CARDIOVERSION  NAME:  [name]   MRN: [id number] DOB:  1931-09-17   ADMIT DATE: 07/05/2017   INDICATIONS: Atrial fibrillation    PROCEDURE:   Emergent consent implied as patient was intubated in shock.   Pads already in place in AP position and patient sedated. Received 200J biphasic synchronized shock without effect.  Pads repositioned and second shock converted to NSR for about 30 seconds but then reverted to AF.   Glori Bickers, MD  5:51 PM

## 2017-07-20 NOTE — Progress Notes (Signed)
I was called emergently to evaluate patient who was less responsive and more hypoxic. Patient had received 0.5mg  Xanax at 9pm. At 1am, RN noted patient to be much less responsive and also progressively hypoxic. Patient placed initially on NRB then on BIPAP. PCCM stat consult placed. At time of my exam patient somnolent but arousable to touch. Pox 90% on 100% FIO2 BIPAP. ABG showed acute hypercapnea. Decision made to intubate.  While getting set up for intubation (before any meds given), patient became hypotensive to 40/20's. Ordered Levophed but there was a delay in its arrival so opened crash cart and gave Epi push. BP improved but then patient went into VT (with a pulse). Gave 1mg  Versed IV as premedication then cardioverted patient. Gave Lidocaine push. She converted to Afib with RVR. She was at this point unresponsive, so proceeded to intubate. Gave Etomidate and Succ as part of intubation. Post intubation patient remained hypotensive and required another Epi push. Then started Levophed infusion. CXR post intubation on my review shows ETT in good position; pulmonary edema bilaterally. Post intubation patient again went into VT (with a pulse) and was again cardioverted. Stopped Amio infusion at this time and gave another Lidocaine push. Patient was converted again to Afib with RVR. Ordered ABG and new labs. Called Cardiology who is on their way in to see patient. PCCM NP placing CVC and Aline.   60 minutes non-procedural critical care time. Full consult note pending.   Vernie Murders, MD Pulmonary & Critical Care Medicine Pager: [phone]

## 2017-07-20 NOTE — Progress Notes (Addendum)
ANTICOAGULATION CONSULT NOTE - Initial Consult  Pharmacy Consult for heparin Indication: Impella  Allergies  Allergen Reactions  . Sulfonamide Derivatives Other (See Comments)    REACTION: childhood unknown type, ended up in hospital    Patient Measurements: Height: '5\' 5"'$  (165.1 cm) Weight: 187 lb 13.3 oz (85.2 kg) IBW/kg (Calculated) : 57 Heparin Dosing Weight: 75.4 kg   Vital Signs: BP: 90/58 (05/21 0706) Pulse Rate: 0 (05/21 0728)  Labs: Recent Labs    07/11/2017 0311  07/16/2017 2125 07/19/17 0331 07/16/2017 0215 07/07/2017 0216 07/16/2017 0600  HGB 12.8  --  14.0  --  12.0  --  13.2  HCT 39.8  --  43.8  --  39.2  --  42.1  PLT 238  --  267  --  238  --   --   CREATININE 0.84  --  0.94 0.78 0.94  --   --   TROPONINI >65.00*   < > >65.00*  --   --  27.06* 25.70*   < > = values in this interval not displayed.    Estimated Creatinine Clearance: 48.9 mL/min (by C-G formula based on SCr of 0.94 mg/dL).   Medical History: Past Medical History:  Diagnosis Date  . Allergic rhinitis due to other allergen 06/14/2008   Qualifier: Diagnosis of  By: Arnoldo Morale MD, Balinda Quails   . Cellulitis and abscess of neck 03/18/2007   Qualifier: Diagnosis of  By: Arnoldo Morale MD, Balinda Quails   . Diabetes mellitus   . Fibrocystic breast disease   . Hypertension   . Meningioma ([place])   . SEBACEOUS CYST, NECK 02/08/2007   Qualifier: Diagnosis of  By: Arnoldo Morale MD, Balinda Quails   . Tendon tear, ankle     Medications:  Scheduled:  . [MAR Hold] aspirin  81 mg Oral Daily  . [MAR Hold] atorvastatin  80 mg Oral Daily  . [MAR Hold] chlorhexidine gluconate (MEDLINE KIT)  15 mL Mouth Rinse BID  . [MAR Hold] docusate  100 mg Per Tube BID  . [MAR Hold] fentaNYL (SUBLIMAZE) injection  50 mcg Intravenous Once  . furosemide  40 mg Intravenous BID  . [MAR Hold] insulin aspart  1-3 Units Subcutaneous Q4H  . [MAR Hold] ipratropium  0.5 mg Nebulization Q6H  . [MAR Hold] mouth rinse  15 mL Mouth Rinse QID  . midazolam      . [MAR  Hold] pantoprazole  40 mg Oral Daily  . [MAR Hold] sodium chloride flush  3 mL Intravenous Q12H  . [MAR Hold] ticagrelor  90 mg Oral BID    Assessment: 61 yof admitted with STEMI with DES to LAD. Was having multiple episodes of VT on 5/19 requiring defibrillation, developed Afib with RVR managed with amiodarone gtt. Became less responsive and hypoxic overnight on 5.21 where patient then became hypotenisve and had episode of VT with pulse >> subsequently sent to cath lab for Impella placement d/t concern for cardiogenic shock.  Received Impella on 5/21 in AM. Of note, was found to have very large R groin hematoma associated with continuous arterial bleeding. Hgb 14>12>13.2 - possible RBC transfusion this morning. Last dose of apixaban was on 5/20 at 2132.  Goal of Therapy:  ACT 160-180 sec Monitor platelets by anticoagulation protocol: Yes   Plan:  Discussed with CCM and Cardiologist team - will hold on adding heparin to purge solution and systemic anticoagulation until bleeding stabilized Will follow up this afternoon  Doylene Canard, PharmD Clinical Pharmacist  Pager: [phone] Phone: [phone]  07/29/2017,8:05 AM  ADDENDUM Heparin purge solution started at concentration of 6.25 units/mL after discussion with cardiology and Impella Representative given bleeding earlier. ACT was drawn prior to start and came back at 147. Pharmacy will continue to follow along with ACT monitoring and signs/symptoms of bleeding. Will defer to team for timing of systemic anticoagulation initiation.   Update: ACT 155 on reduced heparin purge solution this afternoon. Discussed with MD and representative and will increase purge solution to regular 50 unit/mL concentration. Will monitor ACT and address need for systemic anticoagulation pending results.   Doylene Canard, PharmD Clinical Pharmacist

## 2017-07-20 NOTE — Consult Note (Addendum)
Advanced Heart Failure Team Consult Note   Primary Physician: Marin Olp, MD PCP-Cardiologist:  No primary care provider on file.  Reason for Consultation: Cardiogenic shock  HPI:    [name] is seen today for evaluation of cardiogenic shock at the request of Dr. Maryelizabeth Kaufmann is a 84 y.o. female with h/o CAD s/p PCI to RCA and LAD 04/2013, PAF, On Eliquis, Diabetes, and HTN  Presented to [facility] 07/30/2017 with near-syncope, emesis, and CP. Emergently transferred to [facility] with EKG with anterolateral ST elevations. Pertinent labs on admission include K 3.8, Creatinine 0.91, Trop Peak > 65.00, WBC 18.9, Hgb 14.6  Taken emergently for Cath.   LHC 07/30/2017 with  Prox LAD lesion is 100% stenosed -> drug-eluting stent  3.5X20, prox Cx lesion 25% stenosised, Prox RCA to  RCA  lesion 20% stenosed, Post Atrio lesion is 75% stenosed, LV end diastolic pressure is moderately elevated Post intervention, there was 0% residual stenosis. 1st Diag lesion is 100% stenosed. The occlusion is very distal.  Am of 07/10/2017 pt was initially doing well and able to walk halls. Evening of 07/29/2017 she developed  VF arrest -> defibrillated.Then developed VT with defib x 3 with ROS.  Pt taken BACK to cath lab with concern for acute stent thombosis.   LHC 07/12/2017 showed widely patent stents.  Pt decompensated overnight into 07/05/2017 requiring intubation and increased pressor support up to 40 mcg of levophed.   Pt taken to cath lab this am, 07/08/2017, for placement of Mechanical Support. Impella initially placed but had to be withdrawn due to large R groin hematoma and continuous arterial bleeding. Pressure held and Impella re-introduced with peel-away sheath in place to achieve hemostasis with stabilization. Started on milrinone.  Several hours after arrival to unit, pt noted to have cold RLE with no doppler signals. Dr. Donzetta Matters and Dr. Haroldine Laws performed bedside ex-vivo bypass from the 14 French  Sheath side arm of the IMpella to the SFA. Vascular US was able to demonstrate flow in the distal SFA.   Remains on Mirlinone 0.375 and NE at 8. CVP 11-12  Pt remains intubated and sedated.   Currently on Impella at P8. Groin stabilized with ex-vivo femoral artery bypass.  [place] numbers 07/22/2017 as of 1522 (Performed personally at bedside by Dr. Haroldine Laws) PA 43/23 (29) CVP 11-12 CO 2.98 CI 1.55 SVR 1825  Echo 07/19/17 LVEF 30-35%, with AK of apical septal and apical myocardium, with severe HK. GLS -6.9%. Mod LAE, Mild/Mod TR, PA peak pressure 54 mm Hg.   ROS: Patient is intubated and sedated. History has been obtained from chart review.   Home Medications Prior to Admission medications   Medication Sig Start Date End Date Taking? Authorizing Provider  amLODipine (NORVASC) 10 MG tablet Take 10 mg by mouth daily.   Yes [provider]  apixaban (ELIQUIS) 5 MG TABS tablet Take 1 tablet (5 mg total) by mouth 2 (two) times daily. 06/10/17  Yes Marin Olp, MD  aspirin 81 MG tablet Take 81 mg by mouth daily.     Yes [provider]  atenolol (TENORMIN) 100 MG tablet TAKE 1 TABLET BY MOUTH EVERY DAY 10/29/16  Yes Marin Olp, MD  atorvastatin (LIPITOR) 80 MG tablet Take 1 tablet (80 mg total) by mouth daily. 06/10/17  Yes Marin Olp, MD  clindamycin (CLEOCIN) 150 MG capsule Take 150 mg by mouth 4 (four) times daily.   Yes [provider]  donepezil (ARICEPT) 5 MG tablet Take 5 mg by mouth at bedtime.   Yes [provider]  lisinopril (PRINIVIL,ZESTRIL) 10 MG tablet Take 1 tablet (10 mg total) by mouth daily. Increased dose from '5mg'$  on 06/25/17 06/25/17  Yes Marin Olp, MD  nitroGLYCERIN (NITROSTAT) 0.4 MG SL tablet Place 0.4 mg under the tongue every 5 (five) minutes as needed for chest pain.   Yes [provider]  omeprazole (PRILOSEC) 20 MG capsule Take 1 capsule (20 mg total) by mouth daily. Patient taking differently: Take  20 mg by mouth daily as needed (reflux).  06/10/17  Yes Marin Olp, MD  donepezil (ARICEPT) 10 MG tablet Take one tablet daily Patient not taking: Reported on 07/14/2017 06/03/17   Cameron Sprang, MD  fluticasone [facility]) 50 MCG/ACT nasal spray Place 2 sprays into both nostrils daily. Patient not taking: Reported on 07/13/2017 06/10/17   Marin Olp, MD    Past Medical History: Past Medical History:  Diagnosis Date  . Allergic rhinitis due to other allergen 06/14/2008   Qualifier: Diagnosis of  By: Arnoldo Morale MD, Balinda Quails   . Cellulitis and abscess of neck 03/18/2007   Qualifier: Diagnosis of  By: Arnoldo Morale MD, Balinda Quails   . Diabetes mellitus   . Fibrocystic breast disease   . Hypertension   . Meningioma ([place])   . SEBACEOUS CYST, NECK 02/08/2007   Qualifier: Diagnosis of  By: Arnoldo Morale MD, Balinda Quails   . Tendon tear, ankle     Past Surgical History: Past Surgical History:  Procedure Laterality Date  . BREAST LUMPECTOMY    . CARDIOVERSION N/A 04/18/2013   Procedure: CARDIOVERSION-BEDSIDE;  Surgeon: Laverda Page, MD;  Location: [place];  Service: Cardiovascular;  Laterality: N/A;  . COLONOSCOPY     05/25/2000. Results: Normal. Hemorrhoids  . CORONARY/GRAFT ACUTE MI REVASCULARIZATION N/A 07/05/2017   Procedure: CORONARY/GRAFT ACUTE MI REVASCULARIZATION;  Surgeon: Jettie Booze, MD;  Location: [place] CV LAB;  Service: Cardiovascular;  Laterality: N/A;  . LEFT HEART CATH AND CORONARY ANGIOGRAPHY N/A 07/01/2017   Procedure: LEFT HEART CATH AND CORONARY ANGIOGRAPHY;  Surgeon: Adrian Prows, MD;  Location: [place] CV LAB;  Service: Cardiovascular;  Laterality: N/A;  . LEFT HEART CATH AND CORONARY ANGIOGRAPHY N/A 07/16/2017   Procedure: LEFT HEART CATH AND CORONARY ANGIOGRAPHY;  Surgeon: Jettie Booze, MD;  Location: [place] CV LAB;  Service: Cardiovascular;  Laterality: N/A;  . LEFT HEART CATHETERIZATION WITH CORONARY ANGIOGRAM N/A 04/17/2013   Procedure: LEFT HEART  CATHETERIZATION WITH CORONARY ANGIOGRAM;  Surgeon: Lorretta Harp, MD;  Location: [facility] CATH LAB;  Service: Cardiovascular;  Laterality: N/A;  . PERCUTANEOUS CORONARY STENT INTERVENTION (PCI-S) N/A 04/18/2013   Procedure: PERCUTANEOUS CORONARY STENT INTERVENTION (PCI-S);  Surgeon: Laverda Page, MD;  Location: [facility] CATH LAB;  Service: Cardiovascular;  Laterality: N/A;  . PERCUTANEOUS CORONARY STENT INTERVENTION (PCI-S) N/A 04/19/2013   Procedure: PERCUTANEOUS CORONARY STENT INTERVENTION (PCI-S);  Surgeon: Laverda Page, MD;  Location: [facility] CATH LAB;  Service: Cardiovascular;  Laterality: N/A;  . RIGHT HEART CATH N/A 07/13/2017   Procedure: RIGHT HEART CATH;  Surgeon: Nigel Mormon, MD;  Location: [place] CV LAB;  Service: Cardiovascular;  Laterality: N/A;  . tendon tear    . TONSILLECTOMY    . VENTRICULAR ASSIST DEVICE INSERTION N/A 07/11/2017   Procedure: VENTRICULAR ASSIST DEVICE INSERTION;  Surgeon: Nigel Mormon, MD;  Location: [place] CV LAB;  Service: Cardiovascular;  Laterality: N/A;  Family History: Family History  Problem Relation Age of Onset  . Stroke Mother   . Heart disease Father     Social History: Social History   Socioeconomic History  . Marital status: Widowed    Spouse name: Not on file  . Number of children: Not on file  . Years of education: Not on file  . Highest education level: Not on file  Occupational History  . Not on file  Social Needs  . Financial resource strain: Not on file  . Food insecurity:    Worry: Not on file    Inability: Not on file  . Transportation needs:    Medical: Not on file    Non-medical: Not on file  Tobacco Use  . Smoking status: Never Smoker  . Smokeless tobacco: Never Used  Substance and Sexual Activity  . Alcohol use: No  . Drug use: No  . Sexual activity: Never  Lifestyle  . Physical activity:    Days per week: Not on file    Minutes per session: Not on file  . Stress: Not on file    Relationships  . Social connections:    Talks on phone: Not on file    Gets together: Not on file    Attends religious service: Not on file    Active member of club or organization: Not on file    Attends meetings of clubs or organizations: Not on file    Relationship status: Not on file  Other Topics Concern  . Not on file  Social History Narrative  . Not on file    Allergies:  Allergies  Allergen Reactions  . Sulfonamide Derivatives Other (See Comments)    REACTION: childhood unknown type, ended up in hospital    Objective:    Vital Signs:   Temp:  [94.8 F (34.9 C)-100 F (37.8 C)] 100 F (37.8 C) (05/21 1515) Pulse Rate:  [0-147] 76 (05/21 1515) Resp:  [0-75] 26 (05/21 1515) BP: (53-176)/(12-126) 53/23 (05/21 0945) SpO2:  [0 %-100 %] 100 % (05/21 1515) FiO2 (%):  [60 %-100 %] 60 % (05/21 1318) Last BM Date: 07/16/17  Weight change: Filed Weights   07/02/2017 1835 07/06/2017 2030  Weight: 190 lb (86.2 kg) 187 lb 13.3 oz (85.2 kg)    Intake/Output:   Intake/Output Summary (Last 24 hours) at 07/09/2017 1530 Last data filed at 06/30/2017 1500 Gross per 24 hour  Intake 807.87 ml  Output -  Net 807.87 ml      Physical Exam    General: Intubated/sedated.  HEENT: + ETT anicteric  Neck: Supple. JVP ~11-12 Carotids 2+ bilat; no bruits. No thyromegaly or nodule noted. Cor: PMI nondisplaced. Irregularly irregular, No M/G/R noted. +Impella hum.  Lungs: Diminished basilar sounds Abdomen: Soft, non-tender, non-distended, no HSM. No bruits or masses. +BS  Extremities: No cyanosis, clubbing, or rash. BLE cool but improving. RLE groin Impella site stable. Ex-vivo SFA bypass stable with no apparent complications.   Neuro: Intubated/sedated   Telemetry   NSR 70-80s, personally reviewed.   EKG    Afib RVR 120s 07/11/2017, personally reviewed.   Labs   Basic Metabolic Panel: Recent Labs  Lab 07/01/2017 2125 07/19/17 0331 07/15/2017 0215 07/13/2017 0216 06/30/2017 0955  07/09/2017 1026 07/19/2017 1358  NA 138 136 138  --  133* 134* 133*  K 3.8 4.3 3.7  --  4.1 4.2 4.0  CL 104 103 107  --  104  --  103  CO2 22 22 21*  --  21*  --  22  GLUCOSE 165* 190* 222*  --  256* 242* 229*  BUN '15 14 16  '$ --  19  --  20  CREATININE 0.94 0.78 0.94  --  0.98  --  0.89  CALCIUM 9.0 8.7* 7.5*  --  7.7*  --  8.1*  MG 2.0  --  1.8  --   --   --   --   PHOS 2.5  --  4.3 4.4  --   --   --     Liver Function Tests: Recent Labs  Lab 07/11/2017 1839 07/24/2017 0215 07/01/2017 0955  AST 21  --  236*  ALT 19  --  160*  ALKPHOS 77  --  77  BILITOT 1.6*  --  1.6*  PROT 6.5  --  4.6*  ALBUMIN 3.7 2.4* 2.3*   Recent Labs  Lab 07/08/2017 1839  LIPASE 34   No results for input(s): AMMONIA in the last 168 hours.  CBC: Recent Labs  Lab 07/13/2017 1839 07/30/2017 0311 07/10/2017 2125 07/14/2017 0215 07/13/2017 0600 07/29/2017 1026 07/13/2017 1147  WBC 18.9* 12.4* 18.7* 20.6*  --   --  20.4*  NEUTROABS 14.7*  --  15.0*  --   --   --   --   HGB 14.6 12.8 14.0 12.0 13.2 11.9* 12.6  HCT 44.3 39.8 43.8 39.2 42.1 35.0* 37.7  MCV 83.4 84.9 85.0 88.3  --   --  84.0  PLT 250 238 267 238  --   --  184    Cardiac Enzymes: Recent Labs  Lab 07/30/2017 0938 07/16/2017 2125 07/01/2017 0216 07/26/2017 0600 07/04/2017 1358  TROPONINI >65.00* >65.00* 27.06* 25.70* 27.12*    BNP: BNP (last 3 results) No results for input(s): BNP in the last 8760 hours.  ProBNP (last 3 results) No results for input(s): PROBNP in the last 8760 hours.   CBG: Recent Labs  Lab 07/06/2017 2109 07/04/2017 1147  GLUCAP 189* 198*    Coagulation Studies: No results for input(s): LABPROT, INR in the last 72 hours.   Imaging   Ct Head Wo Contrast  Result Date: 07/19/2017 CLINICAL DATA:  Sudden head neck pain. EXAM: CT HEAD WITHOUT CONTRAST TECHNIQUE: Contiguous axial images were obtained from the base of the skull through the vertex without intravenous contrast. COMPARISON:  06/02/2016 FINDINGS: Brain: No evidence of  acute infarction, hemorrhage, extra-axial collection, ventriculomegaly, or mass effect. 1.6 cm right frontal subdural extra-axial calcified mass most consistent with a meningioma. Generalized cerebral atrophy. Periventricular white matter low attenuation likely secondary to microangiopathy. Vascular: Cerebrovascular atherosclerotic calcifications are noted. Skull: Negative for fracture or focal lesion. Sinuses/Orbits: Visualized portions of the orbits are unremarkable. Visualized portions of the paranasal sinuses and mastoid air cells are unremarkable. Other: None. IMPRESSION: 1. No acute intracranial pathology. Electronically Signed   By: Kathreen Devoid   On: 07/19/2017 16:51   Dg Chest Port 1 View  Result Date: 07/14/2017 CLINICAL DATA:  Endotracheal tube placement. EXAM: PORTABLE CHEST 1 VIEW COMPARISON:  Radiographs of Jul 20, 2017. FINDINGS: Stable cardiomediastinal silhouette. Endotracheal and nasogastric tubes are unchanged in position. There is been interval placement of Impella catheter with distal portion in expected position of ascending thoracic aorta. No pneumothorax or pleural effusion is noted. Stable central pulmonary vascular congestion is noted with right basilar edema or infiltrate. Bony thorax is unremarkable. IMPRESSION: Endotracheal and nasogastric tubes are unchanged in position. Interval placement of Impella catheter with distal portion projected over expected position  of ascending thoracic aorta. Stable central pulmonary vascular congestion is noted with right basilar edema or infiltrate. Electronically Signed   By: Marijo Conception, M.D.   On: 07/03/2017 11:25   Dg Chest Port 1 View  Result Date: 07/13/2017 CLINICAL DATA:  Central line placement EXAM: PORTABLE CHEST 1 VIEW COMPARISON:  07/29/2017, 07/16/2017, 07/16/2017, 11/30/2014 FINDINGS: Interval intubation, tip of the endotracheal tube is about 3.9 cm superior to the carina. Esophageal tube tip and side port overlie the proximal  to mid stomach. Left-sided central venous catheter tip overlies the distal SVC. No pneumothorax. Cardiomegaly with vascular congestion. Right greater than left interstitial and alveolar disease with small right-sided pleural effusion. No pneumothorax. IMPRESSION: 1. Left-sided central venous catheter tip overlies the distal SVC. No pneumothorax. 2. Esophageal tube tip and side port overlie the proximal to mid stomach 3. Cardiomegaly with vascular congestion. Extensive right greater than left interstitial and alveolar disease, likely pulmonary edema. Small right pleural effusion Electronically Signed   By: Donavan Foil M.D.   On: 07/29/2017 03:15   Portable Chest X-ray  Result Date: 07/03/2017 CLINICAL DATA:  Initial evaluation status post intubation. EXAM: PORTABLE CHEST 1 VIEW COMPARISON:  Prior radiograph from 07/29/2017 FINDINGS: Endotracheal tube in place with tip well positioned 3.6 cm above the carina. Defibrillator pad overlies the left chest. Stable cardiomegaly. Mediastinal silhouette normal. Lungs normally inflated. Interval worsening in diffuse vascular congestion and interstitial prominence, suggesting moderate pulmonary interstitial edema. Right pleural effusion present. No definite infiltrates, although superimposed infection at the right lung base would be difficult to exclude. No pneumothorax. No acute osseous abnormality. IMPRESSION: 1. Endotracheal tube well positioned with tip positioned 3.6 cm above the carina. 2. Interval development of moderate diffuse pulmonary interstitial edema with small right pleural effusion. Electronically Signed   By: Jeannine Boga M.D.   On: 07/19/2017 02:26      Medications:     Current Medications: . aspirin  81 mg Oral Daily  . atorvastatin  80 mg Oral Daily  . chlorhexidine gluconate (MEDLINE KIT)  15 mL Mouth Rinse BID  . docusate  100 mg Per Tube BID  . fentaNYL (SUBLIMAZE) injection  50 mcg Intravenous Once  . furosemide  40 mg  Intravenous BID  . insulin aspart  1-3 Units Subcutaneous Q4H  . ipratropium  0.5 mg Nebulization Q6H  . mouth rinse  15 mL Mouth Rinse QID  . sodium chloride flush  3 mL Intravenous Q12H  . ticagrelor  90 mg Oral BID     Infusions: . sodium chloride 10 mL/hr at 07/07/2017 0800  . sodium chloride    . sodium chloride 0.9% NICU IV bolus    . amiodarone 30 mg/hr (07/11/2017 0800)  . calcium gluconate    . famotidine (PEPCID) IV Stopped (07/22/2017 1130)  . fentaNYL infusion INTRAVENOUS 150 mcg/hr (07/05/2017 1441)  . impella catheter heparin 50 unit/mL in dextrose 5%    . heparin    . milrinone 0.375 mcg/kg/min (07/28/2017 1247)  . norepinephrine (LEVOPHED) Adult infusion 8 mcg/min (07/26/2017 1354)       Patient Profile   [name] is a 84 y.o. female with h/o CAD s/p PCI to RCA and LAD 04/2013, PAF, On Eliquis, Diabetes, and HTN  Presented to [facility] 07/08/2017 with near-syncope, emesis, and CP. Emergently transferred to [facility] with EKG with anterolateral ST elevations. Taken emergently for Cath.   Assessment/Plan   1. Cardiogenic shock in setting of Acute MI - Output improved with Impella implant  am of 07/09/2017. - Remains on Milrinone 0.375 and NE 8. CO improving to 3.25 and CI 1.5-1.7.  - Impella at P7 with ~2.7-2.8 L of flow with good waveform.  - Not candidate for up-titration to impella 5.0 given age, multiple comorbidiites, and bleeding risk.  - Lactic acid improving with mechanical support. Down to 3.5 this pm.   2. CAD s/p PCA with LAD DES - Stable with mechanical support.  - Continue ASA and statin.   3. Acute hypoxic respiratory failure - Intubated currently. Appreciate CCM care.  4. RLE ischemia - s/p emergent ex-vivo SFA bypass from Impella 06/30/2017 - Dopplers remain dull but color improving.   5. VF Arrest 07/12/2017 - Repeat LHC 07/24/2017 with no acute stent thrombosis.   Patient is critically ill and in danger of multiple system organ failure.  Family updated at  bedside and understand the serious nature of her current condition. Prognosis guarded.   Medication concerns reviewed with patient and pharmacy team. Barriers ide3ntified: None at this time.   Length of Stay: 3  Annamaria Helling  07/15/2017, 3:30 PM  Advanced Heart Failure Team Pager [phone] (M-F; 7a - 4p)  Please contact [facility] for night-coverage after hours (4p -7a ) and weekends on [email or web address]  Agree with above.  84 y/o woman with h/o DM, HTN and CAD s/p previous LAD stent. Admitted with anterior STEMI. Underwent emergent PCI. Was doing well then developed VF and shock. Impella placed but developed large groin bleed. Impella replaced. Was brought back to CCU on low dose NE. Had persistent shock with CO 2.0-2.5L. LVEF 25% Then developed ischemic RLE.   VVS consulted and distal perfusion sheath placed with improvement in perfusion.  On exam  Intubated sedated but will awaken and follow commands JVP 10-12 Cor RRR +s3 Lungs coarse  Ab obese Ext RFA impella with surrounding hematoma (stable)  She is critically ill with persistent cardiogenic shock despite Impella support. Continue NE. Have added milrinone for afterload reduction. RLE ischemia improved with ex vivo shunt. Case d/w Dr. Prescott Gum -> not candidate for upgrade to Impella 5.0 at this point. D/w family and Dr. Virgina Jock. Prognosis extremely tenuous. Will continue current support. The [facility] team will follow.   CRITICAL CARE Performed by: Glori Bickers  Total critical care time: 120 minutes  Critical care time was exclusive of separately billable procedures and treating other patients.  Critical care was necessary to treat or prevent imminent or life-threatening deterioration.  Critical care was time spent personally by me (independent of midlevel providers or residents) on the following activities: development of treatment plan with patient and/or surrogate as well as nursing, discussions with  consultants, evaluation of patient's response to treatment, examination of patient, obtaining history from patient or surrogate, ordering and performing treatments and interventions, ordering and review of laboratory studies, ordering and review of radiographic studies, pulse oximetry and re-evaluation of patient's condition.  Glori Bickers, MD  5:43 PM

## 2017-07-20 NOTE — Progress Notes (Signed)
Critical Care update:  Recurrent Afib w/RVR, increased agitation, increasing requirement of norepinephrine. Impella flow steady 2.0 - 2.2 L/min at P-4 to P-5. She underwent DCCVX3 with only brief conversion to sinus rhythm. CVP 10-12, PCWP 14 mmHg. Urine output has been low 20 cc/hr. Lactic acid increased to 6. Not acidotic on ABG. Right thigh hematoma, Hb stable without signs of active bleeding. She has had decreased alertness and only intermittently following commands when awake. Repetitive chewing movements. No focal neurodeficit on limited exam. Moving all extremities. B?l equal pupils. ?seizures. Discussed over the phone with neuro on call Dr. Rory Percy. Ideally needs CT head, but in my opinion she is too unstable for CT head.   Intervention: NS bolus 250 cc X 3 Impella repositioning IV Dig 250 mcg, followed by 125 mcg q 6hr X 2 Conversion to sinus rhythm, if achieved, should help her LV end diastolic volume, reduce suction alarm, and improve the flow. Will monitor neurological status over 12 hrs. If worsening, will need to address addition of Keppra and CT head.   Total critical care time: 45 min  Oden, MD [facility]. [place] Pager: [phone] Office: [phone] If no answer Cell [phone]

## 2017-07-21 ENCOUNTER — Inpatient Hospital Stay (HOSPITAL_COMMUNITY): Payer: Medicare HMO

## 2017-07-21 ENCOUNTER — Encounter (HOSPITAL_COMMUNITY): Payer: Self-pay | Admitting: Cardiology

## 2017-07-21 ENCOUNTER — Inpatient Hospital Stay (HOSPITAL_COMMUNITY): Admission: EM | Disposition: E | Payer: Self-pay | Source: Home / Self Care | Attending: Cardiology

## 2017-07-21 DIAGNOSIS — I2109 ST elevation (STEMI) myocardial infarction involving other coronary artery of anterior wall: Secondary | ICD-10-CM

## 2017-07-21 HISTORY — PX: VENTRICULAR ASSIST DEVICE REPOSITION: CATH118274

## 2017-07-21 LAB — COMPREHENSIVE METABOLIC PANEL
ALBUMIN: 2 g/dL — AB (ref 3.5–5.0)
ALBUMIN: 2 g/dL — AB (ref 3.5–5.0)
ALK PHOS: 67 U/L (ref 38–126)
ALK PHOS: 70 U/L (ref 38–126)
ALK PHOS: 79 U/L (ref 38–126)
ALT: 121 U/L — AB (ref 14–54)
ALT: 124 U/L — AB (ref 14–54)
ALT: 137 U/L — ABNORMAL HIGH (ref 14–54)
ANION GAP: 8 (ref 5–15)
AST: 102 U/L — AB (ref 15–41)
AST: 123 U/L — AB (ref 15–41)
AST: 179 U/L — AB (ref 15–41)
Albumin: 2 g/dL — ABNORMAL LOW (ref 3.5–5.0)
Anion gap: 7 (ref 5–15)
Anion gap: 3 — ABNORMAL LOW (ref 5–15)
BILIRUBIN TOTAL: 1.7 mg/dL — AB (ref 0.3–1.2)
BILIRUBIN TOTAL: 2.2 mg/dL — AB (ref 0.3–1.2)
BUN: 13 mg/dL (ref 6–20)
BUN: 18 mg/dL (ref 6–20)
BUN: 21 mg/dL — ABNORMAL HIGH (ref 6–20)
CALCIUM: 7.5 mg/dL — AB (ref 8.9–10.3)
CALCIUM: 7.8 mg/dL — AB (ref 8.9–10.3)
CALCIUM: 7.8 mg/dL — AB (ref 8.9–10.3)
CHLORIDE: 104 mmol/L (ref 101–111)
CO2: 21 mmol/L — ABNORMAL LOW (ref 22–32)
CO2: 22 mmol/L (ref 22–32)
CO2: 24 mmol/L (ref 22–32)
CREATININE: 0.79 mg/dL (ref 0.44–1.00)
Chloride: 102 mmol/L (ref 101–111)
Chloride: 105 mmol/L (ref 101–111)
Creatinine, Ser: 0.77 mg/dL (ref 0.44–1.00)
Creatinine, Ser: 0.9 mg/dL (ref 0.44–1.00)
GFR calc Af Amer: >60 mL/min (ref >60)
GFR calc Af Amer: >60 mL/min (ref >60)
GFR calc non Af Amer: 58 mL/min — ABNORMAL LOW (ref >60)
GFR calc non Af Amer: >60 mL/min (ref >60)
GFR calc non Af Amer: >60 mL/min (ref >60)
GLUCOSE: 173 mg/dL — AB (ref 65–99)
GLUCOSE: 237 mg/dL — AB (ref 65–99)
Glucose, Bld: 205 mg/dL — ABNORMAL HIGH (ref 65–99)
Potassium: 3.7 mmol/L (ref 3.5–5.1)
Potassium: 3.9 mmol/L (ref 3.5–5.1)
Potassium: 4.1 mmol/L (ref 3.5–5.1)
SODIUM: 131 mmol/L — AB (ref 135–145)
SODIUM: 133 mmol/L — AB (ref 135–145)
Sodium: 132 mmol/L — ABNORMAL LOW (ref 135–145)
TOTAL PROTEIN: 4.1 g/dL — AB (ref 6.5–8.1)
TOTAL PROTEIN: 4.3 g/dL — AB (ref 6.5–8.1)
Total Bilirubin: 1.6 mg/dL — ABNORMAL HIGH (ref 0.3–1.2)
Total Protein: 4.4 g/dL — ABNORMAL LOW (ref 6.5–8.1)

## 2017-07-21 LAB — POCT I-STAT 3, ART BLOOD GAS (G3+)
ACID-BASE DEFICIT: 3 mmol/L — AB (ref 0.0–2.0)
Acid-base deficit: 3 mmol/L — ABNORMAL HIGH (ref 0.0–2.0)
BICARBONATE: 23.6 mmol/L (ref 20.0–28.0)
Bicarbonate: 20.6 mmol/L (ref 20.0–28.0)
O2 SAT: 99 %
O2 SAT: 99 %
PCO2 ART: 51.7 mmHg — AB (ref 32.0–48.0)
PH ART: 7.27 — AB (ref 7.350–7.450)
Patient temperature: 37.4
Patient temperature: 37.5
TCO2: 21 mmol/L — AB (ref 22–32)
TCO2: 25 mmol/L (ref 22–32)
pCO2 arterial: 29.5 mmHg — ABNORMAL LOW (ref 32.0–48.0)
pH, Arterial: 7.453 — ABNORMAL HIGH (ref 7.350–7.450)
pO2, Arterial: 153 mmHg — ABNORMAL HIGH (ref 83.0–108.0)
pO2, Arterial: 175 mmHg — ABNORMAL HIGH (ref 83.0–108.0)

## 2017-07-21 LAB — ECHOCARDIOGRAM LIMITED
Height: 65 in
Weight: 3365.1 oz

## 2017-07-21 LAB — POCT ACTIVATED CLOTTING TIME
ACTIVATED CLOTTING TIME: 169 s
ACTIVATED CLOTTING TIME: 175 s
Activated Clotting Time: 175 seconds
Activated Clotting Time: 175 seconds
Activated Clotting Time: 175 seconds
Activated Clotting Time: 175 seconds

## 2017-07-21 LAB — BASIC METABOLIC PANEL
Anion gap: 7 (ref 5–15)
BUN: 14 mg/dL (ref 6–20)
CHLORIDE: 102 mmol/L (ref 101–111)
CO2: 22 mmol/L (ref 22–32)
Calcium: 7.9 mg/dL — ABNORMAL LOW (ref 8.9–10.3)
Creatinine, Ser: 0.86 mg/dL (ref 0.44–1.00)
GFR calc Af Amer: >60 mL/min (ref >60)
GFR calc non Af Amer: >60 mL/min (ref >60)
GLUCOSE: 182 mg/dL — AB (ref 65–99)
POTASSIUM: 4.3 mmol/L (ref 3.5–5.1)
Sodium: 131 mmol/L — ABNORMAL LOW (ref 135–145)

## 2017-07-21 LAB — CBC WITH DIFFERENTIAL/PLATELET
ABS IMMATURE GRANULOCYTES: 0.2 10*3/uL — AB (ref 0.0–0.1)
BASOS ABS: 0 10*3/uL (ref 0.0–0.1)
Basophils Relative: 0 %
EOS PCT: 0 %
Eosinophils Absolute: 0 10*3/uL (ref 0.0–0.7)
HCT: 28.8 % — ABNORMAL LOW (ref 36.0–46.0)
Hemoglobin: 10 g/dL — ABNORMAL LOW (ref 12.0–15.0)
Immature Granulocytes: 1 %
LYMPHS ABS: 1.4 10*3/uL (ref 0.7–4.0)
LYMPHS PCT: 9 %
MCH: 28.2 pg (ref 26.0–34.0)
MCHC: 34.7 g/dL (ref 30.0–36.0)
MCV: 81.1 fL (ref 78.0–100.0)
MONO ABS: 1.1 10*3/uL — AB (ref 0.1–1.0)
MONOS PCT: 7 %
NEUTROS ABS: 12.8 10*3/uL — AB (ref 1.7–7.7)
Neutrophils Relative %: 83 %
PLATELETS: 176 10*3/uL (ref 150–400)
RBC: 3.55 MIL/uL — ABNORMAL LOW (ref 3.87–5.11)
RDW: 13.2 % (ref 11.5–15.5)
WBC: 15.6 10*3/uL — ABNORMAL HIGH (ref 4.0–10.5)

## 2017-07-21 LAB — LACTIC ACID, PLASMA
Lactic Acid, Venous: 2.6 mmol/L (ref 0.5–1.9)
Lactic Acid, Venous: 2.7 mmol/L (ref 0.5–1.9)

## 2017-07-21 LAB — GLUCOSE, CAPILLARY
GLUCOSE-CAPILLARY: 153 mg/dL — AB (ref 65–99)
GLUCOSE-CAPILLARY: 157 mg/dL — AB (ref 65–99)
GLUCOSE-CAPILLARY: 163 mg/dL — AB (ref 65–99)
GLUCOSE-CAPILLARY: 174 mg/dL — AB (ref 65–99)
GLUCOSE-CAPILLARY: 184 mg/dL — AB (ref 65–99)
GLUCOSE-CAPILLARY: 217 mg/dL — AB (ref 65–99)
GLUCOSE-CAPILLARY: 220 mg/dL — AB (ref 65–99)
GLUCOSE-CAPILLARY: 222 mg/dL — AB (ref 65–99)
Glucose-Capillary: 243 mg/dL — ABNORMAL HIGH (ref 65–99)
Glucose-Capillary: 203 mg/dL — ABNORMAL HIGH (ref 65–99)
Glucose-Capillary: 177 mg/dL — ABNORMAL HIGH (ref 65–99)
Glucose-Capillary: 156 mg/dL — ABNORMAL HIGH (ref 65–99)
Glucose-Capillary: 145 mg/dL — ABNORMAL HIGH (ref 65–99)
Glucose-Capillary: 115 mg/dL — ABNORMAL HIGH (ref 65–99)
Glucose-Capillary: 188 mg/dL — ABNORMAL HIGH (ref 65–99)
Glucose-Capillary: 195 mg/dL — ABNORMAL HIGH (ref 65–99)
Glucose-Capillary: 184 mg/dL — ABNORMAL HIGH (ref 65–99)
Glucose-Capillary: 193 mg/dL — ABNORMAL HIGH (ref 65–99)
Glucose-Capillary: 182 mg/dL — ABNORMAL HIGH (ref 65–99)
Glucose-Capillary: 171 mg/dL — ABNORMAL HIGH (ref 65–99)
Glucose-Capillary: 152 mg/dL — ABNORMAL HIGH (ref 65–99)
Glucose-Capillary: 158 mg/dL — ABNORMAL HIGH (ref 65–99)

## 2017-07-21 LAB — HEPARIN LEVEL (UNFRACTIONATED): Heparin Unfractionated: 0.98 IU/mL — ABNORMAL HIGH (ref 0.30–0.70)

## 2017-07-21 LAB — HEMOGLOBIN
Hemoglobin: 9.8 g/dL — ABNORMAL LOW (ref 12.0–15.0)
Hemoglobin: 10.1 g/dL — ABNORMAL LOW (ref 12.0–15.0)

## 2017-07-21 LAB — COOXEMETRY PANEL
Carboxyhemoglobin: 1.5 % (ref 0.5–1.5)
METHEMOGLOBIN: 1 % (ref 0.0–1.5)
O2 Saturation: 69.2 %
TOTAL HEMOGLOBIN: 9.8 g/dL — AB (ref 12.0–16.0)

## 2017-07-21 LAB — CBC
HEMATOCRIT: 28.7 % — AB (ref 36.0–46.0)
HEMOGLOBIN: 9.7 g/dL — AB (ref 12.0–15.0)
MCH: 27.8 pg (ref 26.0–34.0)
MCHC: 33.8 g/dL (ref 30.0–36.0)
MCV: 82.2 fL (ref 78.0–100.0)
Platelets: 168 10*3/uL (ref 150–400)
RBC: 3.49 MIL/uL — ABNORMAL LOW (ref 3.87–5.11)
RDW: 13.8 % (ref 11.5–15.5)
WBC: 15.4 10*3/uL — AB (ref 4.0–10.5)

## 2017-07-21 LAB — APTT: aPTT: 87 seconds — ABNORMAL HIGH (ref 24–36)

## 2017-07-21 LAB — PHOSPHORUS
Phosphorus: <1 mg/dL — CL (ref 2.5–4.6)
Phosphorus: 3.1 mg/dL (ref 2.5–4.6)

## 2017-07-21 LAB — LACTATE DEHYDROGENASE: LDH: 560 U/L — ABNORMAL HIGH (ref 98–192)

## 2017-07-21 LAB — MAGNESIUM
MAGNESIUM: 2.8 mg/dL — AB (ref 1.7–2.4)
MAGNESIUM: 2.2 mg/dL (ref 1.7–2.4)
Magnesium: 1.7 mg/dL (ref 1.7–2.4)

## 2017-07-21 SURGERY — VENTRICULAR ASSIST DEVICE REPOSITION
Anesthesia: LOCAL

## 2017-07-21 MED ORDER — DEXMEDETOMIDINE HCL IN NACL 200 MCG/50ML IV SOLN
0.4000 ug/kg/h | INTRAVENOUS | Status: DC
Start: 2017-07-21 — End: 2017-07-21
  Administered 2017-07-21 (×2): 0.4 ug/kg/h via INTRAVENOUS

## 2017-07-21 MED ORDER — ATORVASTATIN CALCIUM 80 MG PO TABS
80.0000 mg | ORAL_TABLET | Freq: Every day | ORAL | Status: DC
  Administered 2017-07-22 – 2017-07-24 (×3): 80 mg
  Filled 2017-07-21 (×4): qty 1

## 2017-07-21 MED ORDER — POTASSIUM PHOSPHATES 15 MMOLE/5ML IV SOLN
30.0000 mmol | Freq: Once | INTRAVENOUS | Status: AC
  Administered 2017-07-21: 30 mmol via INTRAVENOUS
  Filled 2017-07-21: qty 10

## 2017-07-21 MED ORDER — SODIUM CHLORIDE 0.9% FLUSH
10.0000 mL | Freq: Two times a day (BID) | INTRAVENOUS | Status: DC
  Administered 2017-07-21: 40 mL
  Administered 2017-07-21: 10 mL
  Administered 2017-07-22: 40 mL
  Administered 2017-07-23 – 2017-07-24 (×2): 10 mL

## 2017-07-21 MED ORDER — ROCURONIUM BROMIDE 50 MG/5ML IV SOLN: 1.0000 mg/kg | Freq: Once | INTRAVENOUS | Status: DC

## 2017-07-21 MED ORDER — FUROSEMIDE 10 MG/ML IJ SOLN
INTRAMUSCULAR | Status: AC
  Administered 2017-07-21: 20 mg
  Filled 2017-07-21: qty 2

## 2017-07-21 MED ORDER — MIDAZOLAM HCL 2 MG/2ML IJ SOLN
2.0000 mg | Freq: Once | INTRAMUSCULAR | Status: AC
  Administered 2017-07-21: 2 mg via INTRAVENOUS

## 2017-07-21 MED ORDER — MIDAZOLAM HCL 2 MG/2ML IJ SOLN
INTRAMUSCULAR | Status: AC
  Administered 2017-07-21: 2 mg via INTRAVENOUS
  Filled 2017-07-21: qty 2

## 2017-07-21 MED ORDER — SODIUM CHLORIDE 0.9% FLUSH: 10.0000 mL | INTRAVENOUS | Status: DC | PRN

## 2017-07-21 MED ORDER — VITAL HIGH PROTEIN PO LIQD
1000.0000 mL | ORAL | Status: DC
  Administered 2017-07-21: 1000 mL
  Administered 2017-07-22: 17:00:00
  Administered 2017-07-22: 40 mL/h
  Administered 2017-07-22 – 2017-07-23 (×9)
  Administered 2017-07-23 (×2): 1000 mL
  Administered 2017-07-23 (×2)
  Administered 2017-07-24: 1000 mL

## 2017-07-21 MED ORDER — MAGNESIUM SULFATE 4 GM/100ML IV SOLN
4.0000 g | Freq: Once | INTRAVENOUS | Status: AC
  Administered 2017-07-21: 4 g via INTRAVENOUS
  Filled 2017-07-21: qty 100

## 2017-07-21 MED ORDER — ROCURONIUM BROMIDE 10 MG/ML (PF) SYRINGE
1.0000 mg/kg | PREFILLED_SYRINGE | Freq: Once | INTRAVENOUS | Status: AC
  Administered 2017-07-21: 95.4 mg via INTRAVENOUS
  Filled 2017-07-21: qty 10

## 2017-07-21 MED ORDER — SODIUM CHLORIDE 0.9 % IV SOLN
INTRAVENOUS | Status: DC
  Administered 2017-07-21: 1.6 [IU]/h via INTRAVENOUS
  Administered 2017-07-21: 7.4 [IU]/h via INTRAVENOUS
  Administered 2017-07-22: 5.7 [IU]/h via INTRAVENOUS
  Administered 2017-07-22: 7.3 [IU]/h via INTRAVENOUS
  Administered 2017-07-23: 3.5 [IU]/h via INTRAVENOUS
  Administered 2017-07-25: 2.5 [IU]/h via INTRAVENOUS
  Filled 2017-07-21 (×5): qty 1

## 2017-07-21 MED ORDER — TICAGRELOR 90 MG PO TABS
90.0000 mg | ORAL_TABLET | Freq: Two times a day (BID) | ORAL | Status: DC
  Administered 2017-07-21 – 2017-07-24 (×7): 90 mg
  Filled 2017-07-21 (×7): qty 1

## 2017-07-21 MED ORDER — POTASSIUM CHLORIDE 10 MEQ/50ML IV SOLN
10.0000 meq | INTRAVENOUS | Status: AC
  Administered 2017-07-21 (×3): 10 meq via INTRAVENOUS
  Filled 2017-07-21 (×3): qty 50

## 2017-07-21 MED ORDER — CHLORHEXIDINE GLUCONATE CLOTH 2 % EX PADS
6.0000 | MEDICATED_PAD | Freq: Every day | CUTANEOUS | Status: DC
  Administered 2017-07-22 – 2017-07-24 (×3): 6 via TOPICAL

## 2017-07-21 MED ORDER — PRO-STAT SUGAR FREE PO LIQD
30.0000 mL | Freq: Two times a day (BID) | ORAL | Status: DC
  Administered 2017-07-21 – 2017-07-24 (×8): 30 mL
  Filled 2017-07-21 (×8): qty 30

## 2017-07-21 MED ORDER — FUROSEMIDE 10 MG/ML IJ SOLN: 20.0000 mg | Freq: Once | INTRAMUSCULAR | Status: AC

## 2017-07-21 MED ORDER — SODIUM CHLORIDE 0.9 % IV SOLN
1.0000 mg/h | INTRAVENOUS | Status: DC
  Administered 2017-07-21: 1.5 mg/h via INTRAVENOUS
  Administered 2017-07-22 – 2017-07-23 (×2): 2 mg/h via INTRAVENOUS
  Administered 2017-07-24: 1 mg/h via INTRAVENOUS
  Administered 2017-07-25: 2 mg/h via INTRAVENOUS
  Filled 2017-07-21 (×4): qty 10

## 2017-07-21 MED ORDER — FUROSEMIDE 10 MG/ML IJ SOLN
40.0000 mg | Freq: Three times a day (TID) | INTRAMUSCULAR | Status: DC
  Administered 2017-07-21: 40 mg via INTRAVENOUS
  Filled 2017-07-21: qty 4

## 2017-07-21 MED ORDER — ASPIRIN 81 MG PO CHEW
81.0000 mg | CHEWABLE_TABLET | Freq: Every day | ORAL | Status: DC
Start: 2017-07-22 — End: 2017-07-25
  Administered 2017-07-22 – 2017-07-24 (×3): 81 mg
  Filled 2017-07-21 (×3): qty 1

## 2017-07-21 MED ORDER — AMIODARONE IV BOLUS ONLY 150 MG/100ML
150.0000 mg | Freq: Once | INTRAVENOUS | Status: AC
  Administered 2017-07-21: 150 mg via INTRAVENOUS

## 2017-07-21 SURGICAL SUPPLY — 3 items
PACK CARDIAC CATHETERIZATION (CUSTOM PROCEDURE TRAY) ×2 IMPLANT
WIRE EMERALD 3MM-J .035X150CM (WIRE) ×2 IMPLANT
WIRE HI TORQ VERSACORE-J 145CM (WIRE) ×2 IMPLANT

## 2017-07-21 NOTE — Progress Notes (Signed)
Patient went into a panic mode when respiratory therapist was moving ETT form one side to the other. Heart rate shot up to 170 I affib.

## 2017-07-21 NOTE — Progress Notes (Signed)
[facility]  Called back to bedside for vent dyssynchrony: associated with hypotension  Tried precedex, failed Will start versed infusion, rocuronium x1 dose  Roselie Awkward, MD [place] [facility] Pager: [phone] Cell: [phone] After 3pm or if no response, call [phone]

## 2017-07-21 NOTE — Progress Notes (Signed)
[facility]  Subjective  - Moving extremities actively.     Objective (!) 87/58 (!) 110 99 F (37.2 C) (!) 26 100%  Intake/Output Summary (Last 24 hours) at 07/15/2017 0753 Last data filed at 07/09/2017 0700 Gross per 24 hour  Intake 4447.71 ml  Output 853 ml  Net 3594.71 ml    Active range of motion right foot cool to touch, doppler signal in 6 french sheath Calf soft to touch  Assessment/Planning: POD # 2  External  - vivo bypass from the 14 French sheath side arm to the SFA.    Doppler signal in sheath.  Roxy Horseman 07/16/2017 7:53 AM --  Laboratory Lab Results: Recent Labs    07/05/2017 2332 07/11/2017 0505  WBC 17.7* 15.6*  HGB 10.5* 10.0*  HCT 30.5* 28.8*  PLT 181 176   BMET Recent Labs    07/19/2017 2332 07/12/2017 0505  NA 131* 133*  K 3.9 3.7  CL 102 104  CO2 21* 22  GLUCOSE 237* 173*  BUN 21* 18  CREATININE 0.90 0.79  CALCIUM 7.5* 7.8*    COAG Lab Results  Component Value Date   INR 1.05 04/16/2013   INR 0.99 04/16/2013   INR 0.95 09/12/2010   No results found for: PTT

## 2017-07-21 NOTE — Progress Notes (Signed)
PULMONARY / CRITICAL CARE MEDICINE   Name: [name] MRN: [id number] DOB: Jul 30, 1931    ADMISSION DATE:  07/13/2017 CONSULTATION DATE:  07/22/2017  REFERRING MD:  Dr. Einar Gip  CHIEF COMPLAINT:  Resp failure  HISTORY OF PRESENT ILLNESS:   HPI obtained from medical chart review as patient is obtunded and unable to provide.  84 year old female with PMH of noncompliance, CAD with prior MI in 2015, PAF with questionable compliance with Eliquis, HTN, DM and obesity admitted to Cardiology on 5/18 after presenting near syncope with chest pain.  Found to have anterior STEMI and taken to cath lab where she was found to have occluded LAD which was stented.    On 5/19, she had episode of VF and subsequent episodes of VT requiring multiple shocks.  Was alert and oriented post ROSC.  She was taken emergently back to cath lab on 5/19 but no culprit found.  She was going to be medically managed.  Additionally she developed afib with RVR from her baseline bradycardia requiring amiodarone gtt, metoprolol, and subsequently Cardizem for rate control.   She was doing well until the evening of 5/20 when she became diaphoretic and complained of shortness of breath.  She was placed on NRB and given lasix and then required BiPAP.  However she became obtunded and PCCM consulted for further management.   On preparation for intubation, patient developed vtach with pulse requiring cardioversion and antiarrhythmics.  She subsquently became hypotensive requiring vasopressor support, emergency intubation and central line placement.  Cardiology to bedside and preparing patient to return to cath lab for possible IABP vs impella.     SUBJECTIVE:  Remains pressor dependent, has had atrial fibrillation with rapid ventricle response  VITAL SIGNS: Blood Pressure (Abnormal) 87/58   Pulse (Abnormal) 120   Temperature 99 F (37.2 C)   Respiration (Abnormal) 26   Height 5\' 5"  (1.651 m)   Weight 210 lb 5.1 oz (95.4 kg)    Oxygen Saturation 100%   Body Mass Index 35.00 kg/m    HEMODYNAMICS: PAP: (33-315)/(20-305) 41/21 CVP:  [10 mmHg-14 mmHg] 10 mmHg PCWP:  [14 mmHg-16 mmHg] 16 mmHg CO:  [2.3 L/min-4.6 L/min] 4.6 L/min CI:  [1.2 L/min/m2-2.4 L/min/m2] 2.4 L/min/m2  VENTILATOR SETTINGS: Vent Mode: PRVC FiO2 (%):  [40 %-70 %] 40 % Set Rate:  [26 bmp] 26 bmp Vt Set:  [450 mL] 450 mL PEEP:  [5 cmH20-10 cmH20] 5 cmH20 Plateau Pressure:  [19 cmH20-24 cmH20] 21 cmH20  INTAKE / OUTPUT: I/O last 3 completed shifts: In: 4514.5 [I.V.:2189.8; Blood:315; Other:332.8; NG/GT:30; IV Piggyback:1646.9] Out: 853 [Urine:853]  PHYSICAL EXAMINATION: General: Critically ill 84 year old female currently on full ventilator support as well as multiple pressors and Impella HEENT normocephalic atraumatic mucous membranes moist orally intubated Pulmonary diminished throughout equal chest rise on ventilator Cardiac: Tachycardic with atrial fibrillation and rapid ventricular response on telemetry Extremities/muscular skeletal remain cool however improved when compared to the day prior still has non-dopplerable pulse on right lower extremity however this again looks improved from day prior bypass catheter unremarkable Abdomen soft nontender no organomegaly GU clear yellow urine Neuro: Opens eyes follows commands generalized weakness  LABS:  BMET Recent Labs  Lab 07/08/2017 1727 07/09/2017 2332 06/30/2017 0505  NA 133* 131* 133*  K 3.5 3.9 3.7  CL 101 102 104  CO2 21* 21* 22  BUN 22* 21* 18  CREATININE 1.03* 0.90 0.79  GLUCOSE 305* 237* 173*    Electrolytes Recent Labs  Lab 07/03/2017 2125  07/04/2017 0215 07/15/2017 0216  07/12/2017 1727 07/24/2017 2332 07/07/2017 0505  CALCIUM 9.0   < > 7.5*  --    < > 7.9* 7.5* 7.8*  MG 2.0  --  1.8  --   --   --   --  1.7  PHOS 2.5  --  4.3 4.4  --   --   --  <1.0*   < > = values in this interval not displayed.    CBC Recent Labs  Lab 07/13/2017 1727 07/11/2017 2332 07/07/2017 0505   WBC 23.2* 17.7* 15.6*  HGB 11.9* 10.5* 10.0*  HCT 35.9* 30.5* 28.8*  PLT 206 181 176    Coag's No results for input(s): APTT, INR in the last 168 hours.  Sepsis Markers Recent Labs  Lab 07/05/2017 0215  07/19/2017 2043 07/12/2017 0006 07/11/2017 0306  LATICACIDVEN  --    < > 3.7* 2.7* 2.6*  PROCALCITON 0.27  --   --   --   --    < > = values in this interval not displayed.    ABG Recent Labs  Lab 07/29/2017 1951 07/06/2017 2222 07/15/2017 0413  PHART 7.469* 7.483* 7.453*  PCO2ART 31.3* 27.1* 29.5*  PO2ART 72.0* 145.0* 153.0*    Liver Enzymes Recent Labs  Lab 07/02/2017 0955 07/09/2017 2332 07/28/2017 0505  AST 236* 179* 123*  ALT 160* 137* 124*  ALKPHOS 77 67 70  BILITOT 1.6* 2.2* 1.6*  ALBUMIN 2.3* 2.0* 2.0*    Cardiac Enzymes Recent Labs  Lab 07/19/2017 0600 07/19/2017 1358 07/26/2017 2043  TROPONINI 25.70* 27.12* 36.12*    Glucose Recent Labs  Lab 07/02/2017 0210 07/07/2017 0313 07/08/2017 0410 07/10/2017 0513 07/19/2017 0616 07/16/2017 0710  GLUCAP 217* 203* 184* 177* 156* 157*    Imaging Dg Chest Port 1 View  Result Date: 07/22/2017 CLINICAL DATA:  Left ventricular assist device Impella placement. EXAM: PORTABLE CHEST 1 VIEW COMPARISON:  Radiograph earlier this day at 1116 hour. FINDINGS: Endotracheal tube tip 3.1 cm from the carina. Enteric tube in place tip below the diaphragm. Swan-Ganz catheter from an inferior approach tip in the region of the distal right pulmonary outflow tract. Impella device in place with distal pigtail in the region of the left ventricle and outflow portion in the region of the ascending aorta. Left central line tip in the SVC. Other overlying monitoring devices in place. Unchanged heart size and mediastinal contours. Hazy lung base opacities likely pleural effusions with mild increase. Central vascular congestion appears similar. No pneumothorax. IMPRESSION: 1. Stable support apparatus including Impella device with outflow portion in the region of the  ascending aorta in pigtail in the region of the left ventricle. 2. Increasing hazy lung base opacities likely increasing pleural effusions and atelectasis. Stable central vascular congestion. Electronically Signed   By: Jeb Levering M.D.   On: 06/30/2017 21:20   Dg Chest Port 1 View  Result Date: 07/24/2017 CLINICAL DATA:  Endotracheal tube placement. EXAM: PORTABLE CHEST 1 VIEW COMPARISON:  Radiographs of Jul 20, 2017. FINDINGS: Stable cardiomediastinal silhouette. Endotracheal and nasogastric tubes are unchanged in position. There is been interval placement of Impella catheter with distal portion in expected position of ascending thoracic aorta. No pneumothorax or pleural effusion is noted. Stable central pulmonary vascular congestion is noted with right basilar edema or infiltrate. Bony thorax is unremarkable. IMPRESSION: Endotracheal and nasogastric tubes are unchanged in position. Interval placement of Impella catheter with distal portion projected over expected position of ascending thoracic aorta. Stable central pulmonary vascular congestion  is noted with right basilar edema or infiltrate. Electronically Signed   By: Marijo Conception, M.D.   On: 07/19/2017 11:25   STUDIES:  5/18 LHC >> prox LAD 100% stenosed with DES placed, LVEDP elevated at 30  5/19 LHC >> stents patent; no culprit found   5/20 TTE >> Left ventricle: The cavity size was normal. There was mild   concentric hypertrophy. The estimated ejection fraction was in   the range of 30% to 35%. Could not assess LV diastolic function   due to atrial fibrillation. - Regional wall motion abnormality: Akinesis of the apical septal   and apical myocardium; severe hypokinesis of the mid-apical   anterior and mid anteroseptal myocardium. There is also mild   global hypokinesis. GLS severely reduced at -6.9% - Left atrium: The atrium was moderately dilated. - Tricuspid valve: Mildly dilated annulus. There was mild-moderate    regurgitation. - Pulmonary arteries: PA peak pressure: 54 mm Hg (S).  CULTURES: 5/18 MRSA PCR >> neg  ANTIBIOTICS: none  SIGNIFICANT EVENTS: 5/18 Admitted, anterior STEMI/ LHC/ stent 5/19 vfib/ vtach arrest, multiple shocks, LHC 5/21 respiratory distress, vtach, shock, back to cath lab  LINES/TUBES: PIV  5/21 ETT >> 5/21 L [place] CVL >> 5/21 R aline >> 5/21 foley   DISCUSSION: 78 yoF admitted 5/18 with large anterior wall STEMI s/p stent to prox LAD.  On 5/19, went into Vtach/vfib arrest with intact mental status post ROSC, taken back to cath without culprit found with subsequent afib with rvr.   Developed respiratory distress, shock, and recurrent arrhythmias requiring cardioversion during the night 5/20, requiring intubation and vasopressor support.  Patient to return to cath lab for placement of impella per Cards.  Remains in cardiogenic shock.  Now has bypass sheath on right lower extremity which is help with perfusion.  X-ray looks improved.  Taking changes on minute ventilation for respiratory alkalosis.  At this point we need to wait until we see evidence of cardiac improvement.  No role for weaning at this point  ASSESSMENT / PLAN:  Acute hypoxic respiratory failure- related to cardiogenic shock/ pulmonary edema  Portable chest x-ray reviewed: Endotracheal tube,PA catheter Impella, all in satisfactory position.  Diffuse pulmonary edema however aeration improved somewhat compared to day prior plan Continuing full ventilator support Agree with Lasix decrease minute ventilation Follow-up a.m. chest x-ray and arterial blood gas   Large wall anterior STEMI 5/18 s/p stent to prox LAD Cardiogenic shock Afib w/ RVR status post cardioversion with only brief transition to sinus rhythm  recurrent Vtach - TTE 5/20, EF 30-35%, global hypokinesis, PAP 54 mm Hg -Impella initiated 5/21 -Pressor needs: Remains on levo fed and also Impella Plan Continuing amiodarone Continuing  norepinephrine Continuing milrinone Titration of Impella per heart failure team Continue telemetry monitoring Gentle diuresis twice daily  right lower extremity limb ischemia status post ex vivo bypass to SFA on 5/21 with improved perfusion Plan Continue anticoagulation Further follow-up for vascular surgery  At risk for AKI given contrast and shock Plan Avoid hypotension Renal dose medications Follow-up a.m. chemistry  Leukocytosis- afebrile, white blood cells dropping some Plan Trend CBC   DM Plan Continue sliding scale insulin  Acute encephalopathy - intact mental status following arrest 5/19 Plan RASS goal -1 Supportive care  FAMILY  - Updates: Daughters updated at bedside per Dr. Jimmey Ralph  - Inter-disciplinary family meet or Palliative Care meeting due by:  5/28   DVT prophylaxis: heparin gtt SUP H2B Diet: NPO-->start nutrition Activity: BR  Disposition : ICU critically ill   Erick Colace ACNP-BC [place] Pager # [phone] OR # [phone] if no answer  Pager # [phone] OR # [phone] if no answer

## 2017-07-21 NOTE — Progress Notes (Signed)
Initial Nutrition Assessment  DOCUMENTATION CODES:   Obesity unspecified  INTERVENTION:   Phosphorus <1.0; recommend repleation  Tube Feeding:  Vital High Protein @ 40 ml/hr Pro-Stat 30 mL BID Provides 115 g of protein, 1160 kcals and 806 mL of free water Meets 100% calorie and protein needs   NUTRITION DIAGNOSIS:   Inadequate oral intake related to acute illness as evidenced by NPO status.  GOAL:   Patient will meet greater than or equal to 90% of their needs  MONITOR:   Labs, Weight trends, Vent status, TF tolerance  REASON FOR ASSESSMENT:   Ventilator    ASSESSMENT:    84 yo female with acute respiratory failure related to cardiogenic shock and pulmonary edema, large anterior wall STEMI, RLE limb ischemia post ex vivo bypass to SFA. Pt with hx of CAD with prior MI, DM, HTN   5/18 Admitted, large anterior STEMI with stent to proximal LAD 5/19 V.fib/V.tach arrest, multiple shocks, LHC 5/20 TEE 5/21 Respiratory distress requiring intubation 5/21 V.tach, return to Cath lab, LVAD placed 5/21 RLE limb ischemia s/p ex vivo bypass to SFA  Patient is currently intubated on ventilator support, sedated with fentanyl, on levophed and milrinone drips MV: 11.8 L/min Temp (24hrs), Avg:99.5 F (37.5 C), Min:94.8 F (34.9 C), Max:100.4 F (38 C)  Pt in Cath Lab on visit this AM, daughters at bedside. Family reports pt with good appetite PTA with no significant weight changes. Unable to perform physical exam at this time  Discussed nutrition poc with CCM NP; received verbal order to start TF   Weight up since admission; admission weight 187 pounds (85 kg), current weight 210 pounds (95 kg). Net positive 6 L since admission, accounts for some of post-admission weight gain. RD to utilize weight of 85 kg as EDW. BMI 31  Labs: sodium 133, phosphorus <1.0 Meds: NS at 35 ml/hr, lasix, insulin drip, fentanyl  NUTRITION - FOCUSED PHYSICAL EXAM:  Unable to perform, pt not  available  Diet Order:   Diet Order           Diet NPO time specified  Diet effective now          EDUCATION NEEDS:   Not appropriate for education at this time  Skin:  Skin Assessment: Reviewed RN Assessment  Last BM:  5/17  Height:   Ht Readings from Last 1 Encounters:  07/01/2017 5\' 5"  (1.651 m)    Weight:   Wt Readings from Last 1 Encounters:  07/03/2017 210 lb 5.1 oz (95.4 kg)    Ideal Body Weight:  56.8 kg  BMI:  Body mass index is 35 kg/m.  Estimated Nutritional Needs:   Kcal:  [phone] kcals  Protein:  114-143 g  Fluid:  >/= 1.2 L   Kerman Passey MS, RD, LDN, CNSC [phone] Pager  [phone] Weekend/On-Call Pager

## 2017-07-21 NOTE — Progress Notes (Signed)
Subjective:  Sedated and on Vent. Family present at bedside. Patient responds appropriately.   Objective:  Vital Signs in the last 24 hours: Temp:  [94.8 F (34.9 C)-100.4 F (38 C)] 99 F (37.2 C) (05/22 0815) Pulse Rate:  [51-137] 120 (05/22 0815) Resp:  [0-26] 26 (05/22 0815) BP: (53-87)/(23-58) 87/58 (05/21 2335) SpO2:  [88 %-100 %] 100 % (05/22 0815) FiO2 (%):  [40 %-70 %] 40 % (05/22 0800) Weight:  [95.4 kg (210 lb 5.1 oz)] 95.4 kg (210 lb 5.1 oz) (05/22 0600)  Intake/Output from previous day: 05/21 0701 - 05/22 0700 In: 4447.7 [I.V.:2123; Blood:315; NG/GT:30; IV Piggyback:1646.9] Out: 853 [Urine:853]  Physical Exam:  General appearance: no distress and follows simple commands Eyes: negative findings: lids and lashes normal and edematous Neck: no carotid bruit and intubated. JVC difficult to make out Neck: JVP - normal, carotids 2+= without bruits Resp: clear to auscultation bilaterally Chest wall: No obvious trauma. Left subclavian central line access no hematoma Cardio: regular rate and rhythm, S1, S2 normal, no murmur, click, rub or gallop and distant heart sounds GI: soft, non-tender; bowel sounds normal; no masses,  no organomegaly and large pannus present Extremities: Right groin site mild ecchymosis, no bruit, no extension of the hematoma bilateral feet on the cold, capillary refill is normal.    Lab Results: BMP Recent Labs    07/24/2017 1727 07/19/2017 2332 07/28/2017 0505  NA 133* 131* 133*  K 3.5 3.9 3.7  CL 101 102 104  CO2 21* 21* 22  GLUCOSE 305* 237* 173*  BUN 22* 21* 18  CREATININE 1.03* 0.90 0.79  CALCIUM 7.9* 7.5* 7.8*  GFRNONAA 49* 58* >60  GFRAA 57* >60 >60    CBC Recent Labs  Lab 07/11/2017 0505  WBC 15.6*  RBC 3.55*  HGB 10.0*  HCT 28.8*  PLT 176  MCV 81.1  MCH 28.2  MCHC 34.7  RDW 13.2  LYMPHSABS 1.4  MONOABS 1.1*  EOSABS 0.0  BASOSABS 0.0    HEMOGLOBIN A1C Lab Results  Component Value Date   HGBA1C 5.9 (H) 07/30/2017   MPG 122.63 07/11/2017    Cardiac Panel (last 3 results) Recent Labs    07/11/2017 0600 07/07/2017 1358 07/30/2017 2043  TROPONINI 25.70* 27.12* 36.12*    BNP (last 3 results) No results for input(s): PROBNP in the last 8760 hours.  TSH Recent Labs    06/10/17 1451  TSH 1.13    Lipid Panel     Component Value Date/Time   CHOL 224 (H) 06/30/2017 0311   TRIG 95 07/27/2017 0311   HDL 40 (L) 07/14/2017 0311   CHOLHDL 5.6 07/07/2017 0311   VLDL 19 07/12/2017 0311   LDLCALC 165 (H) 07/27/2017 0311   LDLDIRECT 198.0 12/17/2015 1206     Hepatic Function Panel Recent Labs    06/30/2017 0955 07/30/2017 2332 06/30/2017 0505  PROT 4.6* 4.1* 4.4*  ALBUMIN 2.3* 2.0* 2.0*  AST 236* 179* 123*  ALT 160* 137* 124*  ALKPHOS 77 67 70  BILITOT 1.6* 2.2* 1.6*    Imaging: Ct Head Wo Contrast  Result Date: 07/19/2017 CLINICAL DATA:  Sudden head neck pain. EXAM: CT HEAD WITHOUT CONTRAST TECHNIQUE: Contiguous axial images were obtained from the base of the skull through the vertex without intravenous contrast. COMPARISON:  06/02/2016 FINDINGS: Brain: No evidence of acute infarction, hemorrhage, extra-axial collection, ventriculomegaly, or mass effect. 1.6 cm right frontal subdural extra-axial calcified mass most consistent with a meningioma. Generalized cerebral atrophy. Periventricular white matter low attenuation  likely secondary to microangiopathy. Vascular: Cerebrovascular atherosclerotic calcifications are noted. Skull: Negative for fracture or focal lesion. Sinuses/Orbits: Visualized portions of the orbits are unremarkable. Visualized portions of the paranasal sinuses and mastoid air cells are unremarkable. Other: None. IMPRESSION: 1. No acute intracranial pathology. Electronically Signed   By: Kathreen Devoid   On: 07/19/2017 16:51   Dg Chest Port 1 View  Result Date: 07/26/2017 CLINICAL DATA:  Left ventricular assist device Impella placement. EXAM: PORTABLE CHEST 1 VIEW COMPARISON:  Radiograph  earlier this day at 1116 hour. FINDINGS: Endotracheal tube tip 3.1 cm from the carina. Enteric tube in place tip below the diaphragm. Swan-Ganz catheter from an inferior approach tip in the region of the distal right pulmonary outflow tract. Impella device in place with distal pigtail in the region of the left ventricle and outflow portion in the region of the ascending aorta. Left central line tip in the SVC. Other overlying monitoring devices in place. Unchanged heart size and mediastinal contours. Hazy lung base opacities likely pleural effusions with mild increase. Central vascular congestion appears similar. No pneumothorax. IMPRESSION: 1. Stable support apparatus including Impella device with outflow portion in the region of the ascending aorta in pigtail in the region of the left ventricle. 2. Increasing hazy lung base opacities likely increasing pleural effusions and atelectasis. Stable central vascular congestion. Electronically Signed   By: Jeb Levering M.D.   On: 07/24/2017 21:20   Dg Chest Port 1 View  Result Date: 07/06/2017 CLINICAL DATA:  Endotracheal tube placement. EXAM: PORTABLE CHEST 1 VIEW COMPARISON:  Radiographs of Jul 20, 2017. FINDINGS: Stable cardiomediastinal silhouette. Endotracheal and nasogastric tubes are unchanged in position. There is been interval placement of Impella catheter with distal portion in expected position of ascending thoracic aorta. No pneumothorax or pleural effusion is noted. Stable central pulmonary vascular congestion is noted with right basilar edema or infiltrate. Bony thorax is unremarkable. IMPRESSION: Endotracheal and nasogastric tubes are unchanged in position. Interval placement of Impella catheter with distal portion projected over expected position of ascending thoracic aorta. Stable central pulmonary vascular congestion is noted with right basilar edema or infiltrate. Electronically Signed   By: Marijo Conception, M.D.   On: 07/09/2017 11:25   Dg  Chest Port 1 View  Result Date: 06/30/2017 CLINICAL DATA:  Central line placement EXAM: PORTABLE CHEST 1 VIEW COMPARISON:  06/30/2017, 07/03/2017, 07/24/2017, 11/30/2014 FINDINGS: Interval intubation, tip of the endotracheal tube is about 3.9 cm superior to the carina. Esophageal tube tip and side port overlie the proximal to mid stomach. Left-sided central venous catheter tip overlies the distal SVC. No pneumothorax. Cardiomegaly with vascular congestion. Right greater than left interstitial and alveolar disease with small right-sided pleural effusion. No pneumothorax. IMPRESSION: 1. Left-sided central venous catheter tip overlies the distal SVC. No pneumothorax. 2. Esophageal tube tip and side port overlie the proximal to mid stomach 3. Cardiomegaly with vascular congestion. Extensive right greater than left interstitial and alveolar disease, likely pulmonary edema. Small right pleural effusion Electronically Signed   By: Donavan Foil M.D.   On: 07/01/2017 03:15   Portable Chest X-ray  Result Date: 07/04/2017 CLINICAL DATA:  Initial evaluation status post intubation. EXAM: PORTABLE CHEST 1 VIEW COMPARISON:  Prior radiograph from 07/11/2017 FINDINGS: Endotracheal tube in place with tip well positioned 3.6 cm above the carina. Defibrillator pad overlies the left chest. Stable cardiomegaly. Mediastinal silhouette normal. Lungs normally inflated. Interval worsening in diffuse vascular congestion and interstitial prominence, suggesting moderate pulmonary interstitial edema. Right pleural  effusion present. No definite infiltrates, although superimposed infection at the right lung base would be difficult to exclude. No pneumothorax. No acute osseous abnormality. IMPRESSION: 1. Endotracheal tube well positioned with tip positioned 3.6 cm above the carina. 2. Interval development of moderate diffuse pulmonary interstitial edema with small right pleural effusion. Electronically Signed   By: Jeannine Boga M.D.    On: 06/30/2017 02:26    Cardiac Studies:  EKG: EKG 07/30/2017: A. fib with RVR ST elevation in 1 and aVL, V1 to V6 persist, old anterolateral MI.Marland Kitchen  Assessment/Plan:  1. Cardiogenic shock 2. Acute anterior MI.  Rec: Impella difficult to reposition and hence take her back to the lab to reposition.  UOP moderate and cr stable. Hemodynamics stable but would like to optimize. Still inotrope dependent but SVR has come down from >2000 to 1000.  She has respiratory alkalosis and is being addressed by pulmonary. Hb is stable.   Adrian Prows, M.D. 07/09/2017, 8:58 AM [facility] Pager: [phone] Office: [phone] If no answer: [phone]

## 2017-07-21 NOTE — Progress Notes (Signed)
Pt woke up startled, kicking and trying to move around in bed.  Pt began to desat, fio2 increased to 60%, suctioned for bloody secretions. RN aware.

## 2017-07-21 NOTE — Progress Notes (Signed)
Impella device positioned in the cardiac catheterization lab with excellent result, cardiac output greater than 2.2 L/min on P7.

## 2017-07-21 NOTE — Progress Notes (Addendum)
ANTICOAGULATION CONSULT NOTE - [place] for heparin Indication: Impella  Allergies  Allergen Reactions  . Sulfonamide Derivatives Other (See Comments)    REACTION: childhood unknown type, ended up in hospital    Patient Measurements: Height: '5\' 5"'$  (165.1 cm) Weight: 210 lb 5.1 oz (95.4 kg) IBW/kg (Calculated) : 57 Heparin Dosing Weight: 75.4 kg   Vital Signs: Temp: 99 F (37.2 C) (05/22 0815) Temp Source: Core (Comment) (05/22 0800) BP: 87/58 (05/21 2335) Pulse Rate: 120 (05/22 0815)  Labs: Recent Labs    07/02/2017 0600  07/06/2017 1358 07/13/2017 1727 07/07/2017 2043 07/11/2017 2332 07/11/2017 0505  HGB 13.2   < >  --  11.9*  --  10.5* 10.0*  HCT 42.1   < >  --  35.9*  --  30.5* 28.8*  PLT  --    < >  --  206  --  181 176  CREATININE  --    < > 0.89 1.03*  --  0.90 0.79  TROPONINI 25.70*  --  27.12*  --  36.12*  --   --    < > = values in this interval not displayed.    Estimated Creatinine Clearance: 60.9 mL/min (by C-G formula based on SCr of 0.79 mg/dL).   Medical History: Past Medical History:  Diagnosis Date  . Allergic rhinitis due to other allergen 06/14/2008   Qualifier: Diagnosis of  By: Arnoldo Morale MD, Balinda Quails   . Cellulitis and abscess of neck 03/18/2007   Qualifier: Diagnosis of  By: Arnoldo Morale MD, Balinda Quails   . Diabetes mellitus   . Fibrocystic breast disease   . Hypertension   . Meningioma ([place])   . SEBACEOUS CYST, NECK 02/08/2007   Qualifier: Diagnosis of  By: Arnoldo Morale MD, Balinda Quails   . Tendon tear, ankle     Medications:  Scheduled:  . aspirin  81 mg Oral Daily  . atorvastatin  80 mg Oral Daily  . chlorhexidine gluconate (MEDLINE KIT)  15 mL Mouth Rinse BID  . chlorhexidine gluconate (MEDLINE KIT)  15 mL Mouth Rinse BID  . Chlorhexidine Gluconate Cloth  6 each Topical Daily  . docusate  100 mg Per Tube BID  . feeding supplement (PRO-STAT SUGAR FREE 64)  30 mL Per Tube BID  . feeding supplement (VITAL HIGH PROTEIN)  1,000 mL Per Tube  Q24H  . fentaNYL (SUBLIMAZE) injection  50 mcg Intravenous Once  . furosemide  40 mg Intravenous BID  . ipratropium  0.5 mg Nebulization Q6H  . mouth rinse  15 mL Mouth Rinse QID  . sodium chloride flush  10-40 mL Intracatheter Q12H  . sodium chloride flush  3 mL Intravenous Q12H  . ticagrelor  90 mg Oral BID    Assessment: 42 yoF admitted with STEMI with DES to LAD now s/p VF arrest with cardiogenic shock requiring mechanical support with Impella. Pt noted to have large R groin hematoma associated with arterial bleeding with drop in Hgb, so initially heparin only in purge with low concentration (6.25 units/ml) later increased with subtherapeutic ACTs.  Heparin now in purge at full concentration (50 units/ml) running at 14 ml/hr (700 units/hr). ACTs have been at goal ~175 with purge alone. Of note, pt has had continuous rapid AFib since 5/21 pm with several failed cardioversions. aPTT drawn from A-line was therapeutic at 87 seconds with heparinized purge alone and heparin level supratherapeutic due to recent apixaban use, so will hold off on adding systemic heparin to cover  AFib at this time.  Goal of Therapy:  ACT 160-180 sec Monitor platelets by anticoagulation protocol: Yes   Plan:  -Continue heparinized purge solution -Follow ACTs per RN protocol -Daily aPTT, heparin level, CBC  Arrie Senate, PharmD, BCPS PGY-2 Cardiology Pharmacy Resident Pager: [phone] 07/23/2017

## 2017-07-21 NOTE — Progress Notes (Signed)
   VASCULAR SURGERY ASSESSMENT & PLAN:   Still no Doppler flow in feet but feet appear adequately perfused.  There is a Doppler signal through the 6 French sheath in the right superficial femoral artery.  No evidence of compartment syndrome right calf.  She is on 22 mcg of Levophed.  This is obviously also affecting her lower extremity perfusion.  Vascular surgery will continue to follow.  SUBJECTIVE:   Sedated on vent.  PHYSICAL EXAM:   Vitals:   07/29/2017 0107 07/13/2017 0200 07/16/2017 0343 07/14/2017 0400  BP:      Pulse:  (!) 107  (!) 110  Resp:  (!) 26  (!) 26  Temp:  99.5 F (37.5 C)  99.3 F (37.4 C)  TempSrc:    Core  SpO2: 100% 100% 92% 100%  Weight:      Height:       No Doppler signals in either foot however the feet appear reasonably well perfused. No compartment syndrome right calf.  LABS:   Lab Results  Component Value Date   WBC 15.6 (H) 06/30/2017   HGB 10.0 (L) 07/13/2017   HCT 28.8 (L) 07/27/2017   MCV 81.1 07/02/2017   PLT 176 07/24/2017   Lab Results  Component Value Date   CREATININE 0.90 07/05/2017   Lab Results  Component Value Date   INR 1.05 04/16/2013   CBG (last 3)  Recent Labs    07/15/2017 0313 07/13/2017 0410 07/28/2017 0513  GLUCAP 203* 184* 177*    PROBLEM LIST:    Active Problems:   Acute anterior wall MI ([facility])   Acute anterolateral myocardial infarction ([place])   Cardiogenic shock ([facility])   CURRENT MEDS:   . aspirin  81 mg Oral Daily  . atorvastatin  80 mg Oral Daily  . chlorhexidine gluconate (MEDLINE KIT)  15 mL Mouth Rinse BID  . chlorhexidine gluconate (MEDLINE KIT)  15 mL Mouth Rinse BID  . digoxin  0.125 mg Intravenous Q6H  . docusate  100 mg Per Tube BID  . fentaNYL (SUBLIMAZE) injection  50 mcg Intravenous Once  . furosemide  40 mg Intravenous BID  . ipratropium  0.5 mg Nebulization Q6H  . mouth rinse  15 mL Mouth Rinse QID  . sodium chloride flush  3 mL Intravenous Q12H  . ticagrelor  90 mg Oral BID     Deitra Mayo Beeper: [phone] Office: [phone] 07/26/2017

## 2017-07-21 NOTE — Progress Notes (Signed)
Orthopedic Tech Progress Note Patient Details:  [name] 06-Feb-1932 [id number]  Ortho Devices Type of Ortho Device: Knee Immobilizer Ortho Device/Splint Interventions: Criss Alvine 07/03/2017, 6:02 PM

## 2017-07-21 NOTE — Progress Notes (Signed)
Patient went into a panic mode when respiratory therapist was moving ETT fromm one side to the other. Heart rate shot up to 170 in affib. Will monitor and call Md

## 2017-07-21 NOTE — Progress Notes (Signed)
  Echocardiogram 2D Echocardiogram has been performed.  Matilde Bash 07/10/2017, 9:26 AM

## 2017-07-21 NOTE — Progress Notes (Signed)
  Echocardiogram 2D Echocardiogram has been performed.  [name] 07/27/2017, 12:37 PM

## 2017-07-21 NOTE — Progress Notes (Addendum)
Advanced Heart Failure Rounding Note  PCP-Cardiologist: No primary care provider on Rodgers.   Subjective:    Drips: Amio 60 mg/hr NE 22 mcg Milrinone 0.375   Remains in AF with RVR. Coox stable at 69.2% on pressors and mechanical support.   Impella dislodged last night and had to be taken backe to cath lab for repositioning   Impella at P3 with 1.6-1.7 of flow. Good waveform.   Remains intubated. Agitated at times. Approx 33 cc/hr.   Cr 0.79. K 3.7. Mg 1.7. Supp ordered.  Swan numbers PA 44/21 CVP 11-12 CO 4.61 CI 2.39  Objective:   Weight Range: 210 lb 5.1 oz (95.4 kg) Body mass index is 35 kg/m.   Vital Signs:   Temp:  [94.8 F (34.9 C)-100.4 F (38 C)] 99 F (37.2 C) (05/22 0600) Pulse Rate:  [0-137] 110 (05/22 0600) Resp:  [0-30] 26 (05/22 0600) BP: (53-87)/(23-58) 87/58 (05/21 2335) SpO2:  [0 %-100 %] 100 % (05/22 0600) FiO2 (%):  [50 %-100 %] 50 % (05/22 0400) Weight:  [210 lb 5.1 oz (95.4 kg)] 210 lb 5.1 oz (95.4 kg) (05/22 0600) Last BM Date: 07/16/17  Weight change: Filed Weights   07/01/2017 1835 07/19/2017 2030 07/12/2017 0600  Weight: 190 lb (86.2 kg) 187 lb 13.3 oz (85.2 kg) 210 lb 5.1 oz (95.4 kg)    Intake/Output:   Intake/Output Summary (Last 24 hours) at 07/02/2017 0717 Last data filed at 07/14/2017 0700 Gross per 24 hour  Intake 4447.71 ml  Output 813 ml  Net 3634.71 ml      Physical Exam    General: Intubated/sedated.  HEENT: + ETT Neck: Supple. JVP ~10 cm. Carotids 2+ bilat; no bruits. No thyromegaly or nodule noted. Cor: PMI nondisplaced. Irregularly irregular No M/G/R noted Lungs: Diminished basilar sounds Abdomen: Soft, non-tender, non-distended, no HSM. No bruits or masses. +BS  Extremities: No cyanosis, clubbing, or rash. Trace ankle edema.  Neuro: Intubated/sedated   Telemetry   Afib 100-110s, personally reviewed  EKG    No new tracings in chart. (Last 07/02/2017)  Labs    CBC Recent Labs    07/27/2017 2125   07/30/2017 2332 07/12/2017 0505  WBC 18.7*   < > 17.7* 15.6*  NEUTROABS 15.0*  --   --  12.8*  HGB 14.0   < > 10.5* 10.0*  HCT 43.8   < > 30.5* 28.8*  MCV 85.0   < > 81.8 81.1  PLT 267   < > 181 176   < > = values in this interval not displayed.   Basic Metabolic Panel Recent Labs    07/19/2017 0215 07/14/2017 0216  07/12/2017 2332 07/11/2017 0505  NA 138  --    < > 131* 133*  K 3.7  --    < > 3.9 3.7  CL 107  --    < > 102 104  CO2 21*  --    < > 21* 22  GLUCOSE 222*  --    < > 237* 173*  BUN 16  --    < > 21* 18  CREATININE 0.94  --    < > 0.90 0.79  CALCIUM 7.5*  --    < > 7.5* 7.8*  MG 1.8  --   --   --  1.7  PHOS 4.3 4.4  --   --  <1.0*   < > = values in this interval not displayed.   Liver Function Tests Recent Labs  07/04/2017 2332 06/30/2017 0505  AST 179* 123*  ALT 137* 124*  ALKPHOS 67 70  BILITOT 2.2* 1.6*  PROT 4.1* 4.4*  ALBUMIN 2.0* 2.0*   No results for input(s): LIPASE, AMYLASE in the last 72 hours. Cardiac Enzymes Recent Labs    07/12/2017 0600 07/26/2017 1358 07/28/2017 2043  TROPONINI 25.70* 27.12* 36.12*    BNP: BNP (last 3 results) No results for input(s): BNP in the last 8760 hours.  ProBNP (last 3 results) No results for input(s): PROBNP in the last 8760 hours.   D-Dimer No results for input(s): DDIMER in the last 72 hours. Hemoglobin A1C No results for input(s): HGBA1C in the last 72 hours. Fasting Lipid Panel No results for input(s): CHOL, HDL, LDLCALC, TRIG, CHOLHDL, LDLDIRECT in the last 72 hours. Thyroid Function Tests No results for input(s): TSH, T4TOTAL, T3FREE, THYROIDAB in the last 72 hours.  Invalid input(s): FREET3  Other results:   Imaging    Dg Chest Port 1 View  Result Date: 07/24/2017 CLINICAL DATA:  Left ventricular assist device Impella placement. EXAM: PORTABLE CHEST 1 VIEW COMPARISON:  Radiograph earlier this day at 1116 hour. FINDINGS: Endotracheal tube tip 3.1 cm from the carina. Enteric tube in place tip below  the diaphragm. Swan-Ganz catheter from an inferior approach tip in the region of the distal right pulmonary outflow tract. Impella device in place with distal pigtail in the region of the left ventricle and outflow portion in the region of the ascending aorta. Left central line tip in the SVC. Other overlying monitoring devices in place. Unchanged heart size and mediastinal contours. Hazy lung base opacities likely pleural effusions with mild increase. Central vascular congestion appears similar. No pneumothorax. IMPRESSION: 1. Stable support apparatus including Impella device with outflow portion in the region of the ascending aorta in pigtail in the region of the left ventricle. 2. Increasing hazy lung base opacities likely increasing pleural effusions and atelectasis. Stable central vascular congestion. Electronically Signed   By: Jeb Levering M.D.   On: 07/26/2017 21:20   Dg Chest Port 1 View  Result Date: 07/10/2017 CLINICAL DATA:  Endotracheal tube placement. EXAM: PORTABLE CHEST 1 VIEW COMPARISON:  Radiographs of Jul 20, 2017. FINDINGS: Stable cardiomediastinal silhouette. Endotracheal and nasogastric tubes are unchanged in position. There is been interval placement of Impella catheter with distal portion in expected position of ascending thoracic aorta. No pneumothorax or pleural effusion is noted. Stable central pulmonary vascular congestion is noted with right basilar edema or infiltrate. Bony thorax is unremarkable. IMPRESSION: Endotracheal and nasogastric tubes are unchanged in position. Interval placement of Impella catheter with distal portion projected over expected position of ascending thoracic aorta. Stable central pulmonary vascular congestion is noted with right basilar edema or infiltrate. Electronically Signed   By: Marijo Conception, M.D.   On: 07/29/2017 11:25      Medications:     Scheduled Medications: . aspirin  81 mg Oral Daily  . atorvastatin  80 mg Oral Daily  .  chlorhexidine gluconate (MEDLINE KIT)  15 mL Mouth Rinse BID  . chlorhexidine gluconate (MEDLINE KIT)  15 mL Mouth Rinse BID  . digoxin  0.125 mg Intravenous Q6H  . docusate  100 mg Per Tube BID  . fentaNYL (SUBLIMAZE) injection  50 mcg Intravenous Once  . furosemide  40 mg Intravenous BID  . ipratropium  0.5 mg Nebulization Q6H  . mouth rinse  15 mL Mouth Rinse QID  . sodium chloride flush  3 mL Intravenous Q12H  .  ticagrelor  90 mg Oral BID     Infusions: . sodium chloride 10 mL/hr at 07/11/2017 0800  . sodium chloride 35 mL/hr at 07/19/2017 2230  . amiodarone 60 mg/hr (07/28/2017 0623)  . calcium gluconate    . famotidine (PEPCID) IV Stopped (07/29/2017 2321)  . fentaNYL infusion INTRAVENOUS 150 mcg/hr (07/19/2017 0706)  . impella catheter heparin 50 unit/mL in dextrose 5%    . insulin (NOVOLIN-R) infusion 3.9 Units/hr (07/15/2017 0711)  . milrinone 0.375 mcg/kg/min (07/19/2017 0630)  . norepinephrine (LEVOPHED) Adult infusion 22 mcg/min (07/04/2017 0228)     PRN Medications:  sodium chloride, acetaminophen (TYLENOL) oral liquid 160 mg/5 mL **OR** acetaminophen, bisacodyl, fentaNYL, levalbuterol, midazolam, ondansetron (ZOFRAN) IV, sodium chloride flush    Patient Profile   [name] is a 84 y.o. female with h/o CAD s/p PCI to RCA and LAD 04/2013, PAF, On Eliquis, Diabetes, and HTN  Presented to [facility] 07/26/2017 with near-syncope, emesis, and CP. Emergently transferred to [facility] with EKG with anterolateral ST elevations. Taken emergently for Cath.   Assessment/Plan   1. Cardiogenic shock in setting of Acute MI - Output improved with Impella implant am of 07/19/2017. - Coox 69.2% on milrinone 0.375 and NE 22. CO 4.61/CI 2.39 - Volume status remains elevated.  - Continue gentle diuresis with 40 mg lasix IV BID as tolerated. May need to increase.  - Impella at P3 with 1.7-1.8 of flow. Good waveform.  - Not candidate for up-titration to impella 5.0 given age, multiple comorbidiites, and  bleeding risk.  - Lactic acid improving with mechanical support. Down to 2.6 this am.   2. CAD s/p PCA with LAD DES - Stable with mechanical support.   - Continue ASA and statin.   3. Acute hypoxic respiratory failure - Remains intubated and sedated.  - FiO2 50%. 5 PEEP.   4. RLE ischemia - s/p emergent ex-vivo SFA bypass from Impella 07/05/2017 - Stable.   5. VF Arrest 07/03/2017 - Repeat LHC 07/10/2017 with no acute stent thrombosis.  - Keep K > 4.0 and Mg > 2.0. Supp ordered.   6. Paroxysmal Afib with RVR - Attempt DCCV x 3 yesterday, each time with return to Afib.  - Continue amiodarone gtt - CHA2DS2/VAc at least 6.   7. Hypokalemia/Hypomagnesemia - 4 runs of 10 meq K ordered.  - 4 g Mg ordered.  - Goal K >4.0 and Mg > 2.0 as above.   Patient is critically ill and in danger of multiple system organ failure.  Prognosis extremely tenuous.   Medication concerns reviewed with patient and pharmacy team. Barriers identified: None at this time.   Length of Stay: 4  Annamaria Helling  07/03/2017, 7:17 AM  Advanced Heart Failure Team Pager [phone] (M-F; 7a - 4p)  Please contact [facility] for night-coverage after hours (4p -7a ) and weekends on [email or web address]  Agree with above.   She remains critically ill. Impella dislodged overnight and had to be replaced. Waveforms look better. Flow nearly 3L on P-6. Swan numbers improved. But CVP increasing. Weight up about 20 pounds. Creatinine stable. Groin hematoma stable. Leg perfusion intact. Remains in AF with RVR despite RVR.  ECHO reviewed personally. EF 10-15%. Impella position looks good.   Intubated. Sedated on vent. Will respond to pain.  JVP 12-14 Cor IRR IRR tachy Lungs coarse Ab obese NT Ext RFA Impella site ok Soft hematoma. Ex-vivo arterial circuit functioning well with good doppler signal  RLE no longer mottled  She remains  critically ill. Remains on Impella support at P-6. NE at 14 mcg. Milrinone at 0.375.   Cardiac output now greater than 4L.min. Renal function ok. Echo viewed personally EF 10-15% She remains very tenuous. Will give one dose IV lasix and bolus amio. If still in AF will plan repeat DC-CV in am. Discussed with family and explained prognosis is poor but we remain hopeful for recovery.   CRITICAL CARE Performed by: Glori Bickers  Total critical care time: 55 minutes  Critical care time was exclusive of separately billable procedures and treating other patients.  Critical care was necessary to treat or prevent imminent or life-threatening deterioration.  Critical care was time spent personally by me (independent of midlevel providers or residents) on the following activities: development of treatment plan with patient and/or surrogate as well as nursing, discussions with consultants, evaluation of patient's response to treatment, examination of patient, obtaining history from patient or surrogate, ordering and performing treatments and interventions, ordering and review of laboratory studies, ordering and review of radiographic studies, pulse oximetry and re-evaluation of patient's condition.    Glori Bickers, MD  8:58 PM

## 2017-07-22 ENCOUNTER — Inpatient Hospital Stay (HOSPITAL_COMMUNITY): Payer: Medicare HMO

## 2017-07-22 DIAGNOSIS — J9601 Acute respiratory failure with hypoxia: Secondary | ICD-10-CM

## 2017-07-22 DIAGNOSIS — I469 Cardiac arrest, cause unspecified: Secondary | ICD-10-CM | POA: Diagnosis not present

## 2017-07-22 DIAGNOSIS — I25110 Atherosclerotic heart disease of native coronary artery with unstable angina pectoris: Secondary | ICD-10-CM | POA: Diagnosis not present

## 2017-07-22 DIAGNOSIS — I213 ST elevation (STEMI) myocardial infarction of unspecified site: Secondary | ICD-10-CM

## 2017-07-22 DIAGNOSIS — J811 Chronic pulmonary edema: Secondary | ICD-10-CM

## 2017-07-22 LAB — GLUCOSE, CAPILLARY
GLUCOSE-CAPILLARY: 117 mg/dL — AB (ref 65–99)
GLUCOSE-CAPILLARY: 152 mg/dL — AB (ref 65–99)
GLUCOSE-CAPILLARY: 163 mg/dL — AB (ref 65–99)
GLUCOSE-CAPILLARY: 167 mg/dL — AB (ref 65–99)
GLUCOSE-CAPILLARY: 168 mg/dL — AB (ref 65–99)
GLUCOSE-CAPILLARY: 169 mg/dL — AB (ref 65–99)
GLUCOSE-CAPILLARY: 169 mg/dL — AB (ref 65–99)
GLUCOSE-CAPILLARY: 170 mg/dL — AB (ref 65–99)
GLUCOSE-CAPILLARY: 172 mg/dL — AB (ref 65–99)
GLUCOSE-CAPILLARY: 175 mg/dL — AB (ref 65–99)
GLUCOSE-CAPILLARY: 191 mg/dL — AB (ref 65–99)
GLUCOSE-CAPILLARY: 193 mg/dL — AB (ref 65–99)
Glucose-Capillary: 161 mg/dL — ABNORMAL HIGH (ref 65–99)
Glucose-Capillary: 158 mg/dL — ABNORMAL HIGH (ref 65–99)
Glucose-Capillary: 151 mg/dL — ABNORMAL HIGH (ref 65–99)
Glucose-Capillary: 161 mg/dL — ABNORMAL HIGH (ref 65–99)
Glucose-Capillary: 167 mg/dL — ABNORMAL HIGH (ref 65–99)
Glucose-Capillary: 172 mg/dL — ABNORMAL HIGH (ref 65–99)
Glucose-Capillary: 173 mg/dL — ABNORMAL HIGH (ref 65–99)
Glucose-Capillary: 152 mg/dL — ABNORMAL HIGH (ref 65–99)
Glucose-Capillary: 138 mg/dL — ABNORMAL HIGH (ref 65–99)
Glucose-Capillary: 140 mg/dL — ABNORMAL HIGH (ref 65–99)
Glucose-Capillary: 173 mg/dL — ABNORMAL HIGH (ref 65–99)
Glucose-Capillary: 179 mg/dL — ABNORMAL HIGH (ref 65–99)
Glucose-Capillary: 179 mg/dL — ABNORMAL HIGH (ref 65–99)

## 2017-07-22 LAB — COMPREHENSIVE METABOLIC PANEL
ALBUMIN: 2.2 g/dL — AB (ref 3.5–5.0)
ALT: 117 U/L — ABNORMAL HIGH (ref 14–54)
ANION GAP: 7 (ref 5–15)
AST: 93 U/L — AB (ref 15–41)
Alkaline Phosphatase: 86 U/L (ref 38–126)
BILIRUBIN TOTAL: 2.5 mg/dL — AB (ref 0.3–1.2)
BUN: 16 mg/dL (ref 6–20)
CO2: 24 mmol/L (ref 22–32)
Calcium: 7.8 mg/dL — ABNORMAL LOW (ref 8.9–10.3)
Chloride: 100 mmol/L — ABNORMAL LOW (ref 101–111)
Creatinine, Ser: 0.99 mg/dL (ref 0.44–1.00)
GFR calc Af Amer: 59 mL/min — ABNORMAL LOW (ref >60)
GFR calc non Af Amer: 51 mL/min — ABNORMAL LOW (ref >60)
GLUCOSE: 158 mg/dL — AB (ref 65–99)
POTASSIUM: 4 mmol/L (ref 3.5–5.1)
SODIUM: 131 mmol/L — AB (ref 135–145)
Total Protein: 4.7 g/dL — ABNORMAL LOW (ref 6.5–8.1)

## 2017-07-22 LAB — POCT ACTIVATED CLOTTING TIME
ACTIVATED CLOTTING TIME: 175 s
Activated Clotting Time: 175 seconds
Activated Clotting Time: 169 seconds
Activated Clotting Time: 169 seconds
Activated Clotting Time: 169 seconds
Activated Clotting Time: 169 seconds

## 2017-07-22 LAB — CBC WITH DIFFERENTIAL/PLATELET
ABS IMMATURE GRANULOCYTES: 0.4 10*3/uL — AB (ref 0.0–0.1)
BASOS PCT: 0 %
Basophils Absolute: 0 10*3/uL (ref 0.0–0.1)
Eosinophils Absolute: 0 10*3/uL (ref 0.0–0.7)
Eosinophils Relative: 0 %
HEMATOCRIT: 27.5 % — AB (ref 36.0–46.0)
HEMOGLOBIN: 9.2 g/dL — AB (ref 12.0–15.0)
IMMATURE GRANULOCYTES: 2 %
LYMPHS ABS: 1 10*3/uL (ref 0.7–4.0)
LYMPHS PCT: 5 %
MCH: 28 pg (ref 26.0–34.0)
MCHC: 33.5 g/dL (ref 30.0–36.0)
MCV: 83.6 fL (ref 78.0–100.0)
MONO ABS: 1.5 10*3/uL — AB (ref 0.1–1.0)
MONOS PCT: 8 %
NEUTROS ABS: 16.5 10*3/uL — AB (ref 1.7–7.7)
Neutrophils Relative %: 85 %
Platelets: 170 10*3/uL (ref 150–400)
RBC: 3.29 MIL/uL — ABNORMAL LOW (ref 3.87–5.11)
RDW: 14 % (ref 11.5–15.5)
WBC: 19.3 10*3/uL — ABNORMAL HIGH (ref 4.0–10.5)

## 2017-07-22 LAB — BLOOD GAS, ARTERIAL

## 2017-07-22 LAB — PHOSPHORUS
PHOSPHORUS: 1.9 mg/dL — AB (ref 2.5–4.6)
Phosphorus: 2.8 mg/dL (ref 2.5–4.6)

## 2017-07-22 LAB — POCT I-STAT 3, ART BLOOD GAS (G3+)
ACID-BASE DEFICIT: 4 mmol/L — AB (ref 0.0–2.0)
Acid-base deficit: 4 mmol/L — ABNORMAL HIGH (ref 0.0–2.0)
Acid-base deficit: 3 mmol/L — ABNORMAL HIGH (ref 0.0–2.0)
BICARBONATE: 22.7 mmol/L (ref 20.0–28.0)
BICARBONATE: 22.1 mmol/L (ref 20.0–28.0)
Bicarbonate: 22.6 mmol/L (ref 20.0–28.0)
O2 SAT: 99 %
O2 SAT: 96 %
O2 Saturation: 99 %
PCO2 ART: 46.4 mmHg (ref 32.0–48.0)
PH ART: 7.384 (ref 7.350–7.450)
Patient temperature: 37.2
Patient temperature: 37.2
Patient temperature: 37.1
TCO2: 24 mmol/L (ref 22–32)
TCO2: 24 mmol/L (ref 22–32)
TCO2: 23 mmol/L (ref 22–32)
pCO2 arterial: 46.7 mmHg (ref 32.0–48.0)
pCO2 arterial: 37.1 mmHg (ref 32.0–48.0)
pH, Arterial: 7.295 — ABNORMAL LOW (ref 7.350–7.450)
pH, Arterial: 7.297 — ABNORMAL LOW (ref 7.350–7.450)
pO2, Arterial: 170 mmHg — ABNORMAL HIGH (ref 83.0–108.0)
pO2, Arterial: 171 mmHg — ABNORMAL HIGH (ref 83.0–108.0)
pO2, Arterial: 81 mmHg — ABNORMAL LOW (ref 83.0–108.0)

## 2017-07-22 LAB — HEPARIN LEVEL (UNFRACTIONATED): HEPARIN UNFRACTIONATED: 0.98 [IU]/mL — AB (ref 0.30–0.70)

## 2017-07-22 LAB — HEMOGLOBIN
HEMOGLOBIN: 8.5 g/dL — AB (ref 12.0–15.0)
Hemoglobin: 9.1 g/dL — ABNORMAL LOW (ref 12.0–15.0)
Hemoglobin: 7.8 g/dL — ABNORMAL LOW (ref 12.0–15.0)

## 2017-07-22 LAB — MAGNESIUM
Magnesium: 2 mg/dL (ref 1.7–2.4)
Magnesium: 1.8 mg/dL (ref 1.7–2.4)

## 2017-07-22 LAB — APTT: APTT: 72 s — AB (ref 24–36)

## 2017-07-22 LAB — COOXEMETRY PANEL
CARBOXYHEMOGLOBIN: 1.3 % (ref 0.5–1.5)
METHEMOGLOBIN: 1 % (ref 0.0–1.5)
O2 Saturation: 70.9 %
Total hemoglobin: 8.1 g/dL — ABNORMAL LOW (ref 12.0–16.0)

## 2017-07-22 LAB — POTASSIUM: POTASSIUM: 3.3 mmol/L — AB (ref 3.5–5.1)

## 2017-07-22 MED ORDER — DIGOXIN 0.25 MG/ML IJ SOLN
0.1250 mg | Freq: Every day | INTRAMUSCULAR | Status: DC
  Administered 2017-07-22 – 2017-07-24 (×4): 0.125 mg via INTRAVENOUS
  Filled 2017-07-22 (×4): qty 2

## 2017-07-22 MED ORDER — AMIODARONE LOAD VIA INFUSION
150.0000 mg | Freq: Once | INTRAVENOUS | Status: AC
  Administered 2017-07-22: 150 mg via INTRAVENOUS
  Filled 2017-07-22: qty 83.34

## 2017-07-22 MED ORDER — VASOPRESSIN 20 UNIT/ML IV SOLN: 0.0100 [IU]/min | INTRAVENOUS | Status: DC

## 2017-07-22 MED ORDER — SODIUM CHLORIDE 0.9% FLUSH
3.0000 mL | Freq: Two times a day (BID) | INTRAVENOUS | Status: DC
  Administered 2017-07-22: 6 mL via INTRAVENOUS
  Administered 2017-07-22 – 2017-07-24 (×4): 3 mL via INTRAVENOUS

## 2017-07-22 MED ORDER — VASOPRESSIN 20 UNIT/ML IV SOLN
0.0300 [IU]/min | INTRAVENOUS | Status: DC
  Administered 2017-07-22 – 2017-07-23 (×2): 0.03 [IU]/min via INTRAVENOUS
  Filled 2017-07-22 (×3): qty 2

## 2017-07-22 MED ORDER — SODIUM CHLORIDE 0.9% FLUSH: 3.0000 mL | INTRAVENOUS | Status: DC | PRN

## 2017-07-22 MED ORDER — FUROSEMIDE 10 MG/ML IJ SOLN
12.0000 mg/h | INTRAVENOUS | Status: DC
  Administered 2017-07-22: 8 mg/h via INTRAVENOUS
  Administered 2017-07-23 – 2017-07-25 (×3): 12 mg/h via INTRAVENOUS
  Filled 2017-07-22 (×4): qty 25
  Filled 2017-07-22 (×2): qty 21

## 2017-07-22 MED ORDER — SODIUM CHLORIDE 0.9 % IV SOLN: 250.0000 mL | INTRAVENOUS | Status: DC

## 2017-07-22 MED ORDER — PIPERACILLIN-TAZOBACTAM 3.375 G IVPB
3.3750 g | Freq: Three times a day (TID) | INTRAVENOUS | Status: DC
  Administered 2017-07-22 – 2017-07-25 (×8): 3.375 g via INTRAVENOUS
  Filled 2017-07-22 (×11): qty 50

## 2017-07-22 MED ORDER — AMIODARONE IV BOLUS ONLY 150 MG/100ML
150.0000 mg | Freq: Once | INTRAVENOUS | Status: AC
  Administered 2017-07-22: 150 mg via INTRAVENOUS

## 2017-07-22 MED ORDER — POTASSIUM CHLORIDE 20 MEQ/15ML (10%) PO SOLN
40.0000 meq | Freq: Once | ORAL | Status: AC
  Administered 2017-07-22: 40 meq

## 2017-07-22 MED ORDER — SODIUM CHLORIDE 0.9 % IV SOLN
1250.0000 mg | INTRAVENOUS | Status: DC
  Administered 2017-07-22 – 2017-07-24 (×3): 1250 mg via INTRAVENOUS
  Filled 2017-07-22 (×4): qty 1250

## 2017-07-22 MED ORDER — POTASSIUM CHLORIDE 20 MEQ/15ML (10%) PO SOLN
ORAL | Status: AC
  Filled 2017-07-22: qty 30

## 2017-07-22 NOTE — Progress Notes (Signed)
Pharmacy Antibiotic Note  [name] is a 84 y.o. female admitted on 07/16/2017 with STEMI and subsequent cardiac arrest now s/p Impella for cardiogenic shock.  Pharmacy has been consulted for vancomycin and Zosyn dosing with concern for sepsis. WBC elevated, UOP labile, blood cultures sent.  Plan: -Zosyn 3.375g IV q8h EI -Vancomycin 1250mg  IV q24h -Monitor UOP and SCr closely -Vancomycin level at steady state  Height: 5\' 5"  (165.1 cm) Weight: 218 lb 14.7 oz (99.3 kg) IBW/kg (Calculated) : 57  Temp (24hrs), Avg:99 F (37.2 C), Min:98.6 F (37 C), Max:99.5 F (37.5 C)  Recent Labs  Lab 07/24/2017 1358 07/13/2017 1727 07/16/2017 2043 07/11/2017 2332 07/04/2017 0006 07/30/2017 0306 07/14/2017 0505 07/24/2017 1527 07/24/2017 1816 07/22/17 0422  WBC  --  23.2*  --  17.7*  --   --  15.6* 15.4*  --  19.3*  CREATININE 0.89 1.03*  --  0.90  --   --  0.79 0.77 0.86 0.99  LATICACIDVEN 3.5* 6.0* 3.7*  --  2.7* 2.6*  --   --   --   --     Estimated Creatinine Clearance: 50.2 mL/min (by C-G formula based on SCr of 0.99 mg/dL).    Allergies  Allergen Reactions  . Sulfonamide Derivatives Other (See Comments)    REACTION: childhood unknown type, ended up in hospital    Antimicrobials this admission: Vancomycin 5/23 >>  Zosyn 5/23 >> none  Dose adjustments this admission: none  Microbiology results: sent  Thank you for allowing pharmacy to be a part of this patient's care.  Arrie Senate, PharmD, BCPS PGY-2 Cardiology Pharmacy Resident Pager: [phone] 07/22/2017

## 2017-07-22 NOTE — Progress Notes (Signed)
   VASCULAR SURGERY ASSESSMENT & PLAN:   No change in perfusion of bilateral lower extremities.  No evidence of compartment syndrome right calf.   SUBJECTIVE:   Sedated on vent.  PHYSICAL EXAM:   Vitals:   07/22/17 0355 07/22/17 0448 07/22/17 0637 07/22/17 0638  BP:      Pulse: (!) 118 (!) 129 (!) 120 (!) 115  Resp: (!) '24 15 13 14  '$ Temp: 99 F (37.2 C) 99 F (37.2 C) 99 F (37.2 C) 99 F (37.2 C)  TempSrc:      SpO2: 100% 100% 100% 100%  Weight:   218 lb 14.7 oz (99.3 kg)   Height:       No Doppler signals in feet but feet appear adequately perfused. Right calf is soft.  LABS:   Lab Results  Component Value Date   WBC 19.3 (H) 07/22/2017   HGB 9.2 (L) 07/22/2017   HCT 27.5 (L) 07/22/2017   MCV 83.6 07/22/2017   PLT 170 07/22/2017   Lab Results  Component Value Date   CREATININE 0.99 07/22/2017   Lab Results  Component Value Date   INR 1.05 04/16/2013   CBG (last 3)  Recent Labs    07/22/17 0305 07/22/17 0350 07/22/17 0500  GLUCAP 158* 151* 161*    PROBLEM LIST:    Active Problems:   Acute anterior wall MI ([place])   Acute anterolateral myocardial infarction ([place])   Cardiogenic shock ([place])   CURRENT MEDS:   . aspirin  81 mg Per Tube Daily  . atorvastatin  80 mg Per Tube Daily  . chlorhexidine gluconate (MEDLINE KIT)  15 mL Mouth Rinse BID  . chlorhexidine gluconate (MEDLINE KIT)  15 mL Mouth Rinse BID  . Chlorhexidine Gluconate Cloth  6 each Topical Daily  . digoxin  0.125 mg Intravenous Daily  . docusate  100 mg Per Tube BID  . feeding supplement (PRO-STAT SUGAR FREE 64)  30 mL Per Tube BID  . feeding supplement (VITAL HIGH PROTEIN)  1,000 mL Per Tube Q24H  . fentaNYL (SUBLIMAZE) injection  50 mcg Intravenous Once  . furosemide  40 mg Intravenous TID  . ipratropium  0.5 mg Nebulization Q6H  . mouth rinse  15 mL Mouth Rinse QID  . sodium chloride flush  10-40 mL Intracatheter Q12H  . sodium chloride flush  3 mL Intravenous Q12H  . sodium  chloride flush  3 mL Intravenous Q12H  . ticagrelor  90 mg Per Tube BID    Deitra Mayo Beeper: [phone] Office: [phone] 07/22/2017

## 2017-07-22 NOTE — Progress Notes (Addendum)
Advanced Heart Failure Rounding Note  PCP-Cardiologist: No primary care provider on file.   Subjective:    Drips: Amio 60 mg/hr NE 22 mcg Milrinone 0.375   Remains in AF with RVR, Rate as high as 170 with agitation.   Coox 70.9% this am on milrinone 0.375 and NE 24 -> 30  Impella dislodged overnight 07/06/2017 and had to be taken back to cath lab for re-positioning.   Impella at P6 with 2.6-2.7 of flow. Good waveform.   Remains intubated. Agitated at times. UOP down this am to 10-15 cc an hour. CVP 13-14  Cr 0.99. K 4.0. Mg 2.0.  WBC 19 and requiring more pressors. ? Early sepsis  Swan numbers PA 40/23 CVP 13-14 CO 4.93 CI 2.56  Objective:   Weight Range: 218 lb 14.7 oz (99.3 kg) Body mass index is 36.43 kg/m.   Vital Signs:   Temp:  [98.6 F (37 C)-99.5 F (37.5 C)] 99 F (37.2 C) (05/23 3474) Pulse Rate:  [30-161] 115 (05/23 0638) Resp:  [0-34] 14 (05/23 2595) BP: (90-128)/(55-76) 90/73 (05/22 1137) SpO2:  [0 %-100 %] 100 % (05/23 0638) FiO2 (%):  [40 %-60 %] 50 % (05/23 0355) Weight:  [218 lb 14.7 oz (99.3 kg)] 218 lb 14.7 oz (99.3 kg) (05/23 0637) Last BM Date: 07/16/17  Weight change: Filed Weights   06/30/2017 2030 07/15/2017 0600 07/22/17 0637  Weight: 187 lb 13.3 oz (85.2 kg) 210 lb 5.1 oz (95.4 kg) 218 lb 14.7 oz (99.3 kg)    Intake/Output:   Intake/Output Summary (Last 24 hours) at 07/22/2017 0718 Last data filed at 07/22/2017 0700 Gross per 24 hour  Intake 5104.72 ml  Output 1975 ml  Net 3129.72 ml      Physical Exam    General: Intubated/sedated.  HEENT: + ETT Neck: Supple. JVP to jaw. Carotids 2+ bilat; no bruits. No thyromegaly or nodule noted. Cor: PMI nondisplaced. Irregularly irregular. +impella hum.  Lungs: Diminished basilar sounds Abdomen: Soft, non-tender, non-distended, no HSM. No bruits or masses. +BS  Extremities: No cyanosis, clubbing, or rash. Trace ankle edema. R groin stable.  Neuro: Intubated/sedated   Telemetry    Afib 120-130s, personally reviewed.   EKG    No new tracings.    Labs    CBC Recent Labs    07/30/2017 0505  07/28/2017 1527  07/22/17 0112 07/22/17 0422  WBC 15.6*  --  15.4*  --   --  19.3*  NEUTROABS 12.8*  --   --   --   --  16.5*  HGB 10.0*   < > 9.7*   < > 9.1* 9.2*  HCT 28.8*  --  28.7*  --   --  27.5*  MCV 81.1  --  82.2  --   --  83.6  PLT 176  --  168  --   --  170   < > = values in this interval not displayed.   Basic Metabolic Panel Recent Labs    07/06/2017 1816 07/22/17 0422  NA 131* 131*  K 4.3 4.0  CL 102 100*  CO2 22 24  GLUCOSE 182* 158*  BUN 14 16  CREATININE 0.86 0.99  CALCIUM 7.9* 7.8*  MG 2.2 2.0  PHOS 3.1 2.8   Liver Function Tests Recent Labs    07/29/2017 1527 07/22/17 0422  AST 102* 93*  ALT 121* 117*  ALKPHOS 79 86  BILITOT 1.7* 2.5*  PROT 4.3* 4.7*  ALBUMIN 2.0* 2.2*   No  results for input(s): LIPASE, AMYLASE in the last 72 hours. Cardiac Enzymes Recent Labs    07/01/2017 0600 07/30/2017 1358 07/03/2017 2043  TROPONINI 25.70* 27.12* 36.12*    BNP: BNP (last 3 results) No results for input(s): BNP in the last 8760 hours.  ProBNP (last 3 results) No results for input(s): PROBNP in the last 8760 hours.   D-Dimer No results for input(s): DDIMER in the last 72 hours. Hemoglobin A1C No results for input(s): HGBA1C in the last 72 hours. Fasting Lipid Panel No results for input(s): CHOL, HDL, LDLCALC, TRIG, CHOLHDL, LDLDIRECT in the last 72 hours. Thyroid Function Tests No results for input(s): TSH, T4TOTAL, T3FREE, THYROIDAB in the last 72 hours.  Invalid input(s): FREET3  Other results:   Imaging    No results found.   Medications:     Scheduled Medications: . aspirin  81 mg Per Tube Daily  . atorvastatin  80 mg Per Tube Daily  . chlorhexidine gluconate (MEDLINE KIT)  15 mL Mouth Rinse BID  . chlorhexidine gluconate (MEDLINE KIT)  15 mL Mouth Rinse BID  . Chlorhexidine Gluconate Cloth  6 each Topical Daily   . digoxin  0.125 mg Intravenous Daily  . docusate  100 mg Per Tube BID  . feeding supplement (PRO-STAT SUGAR FREE 64)  30 mL Per Tube BID  . feeding supplement (VITAL HIGH PROTEIN)  1,000 mL Per Tube Q24H  . fentaNYL (SUBLIMAZE) injection  50 mcg Intravenous Once  . furosemide  40 mg Intravenous TID  . ipratropium  0.5 mg Nebulization Q6H  . mouth rinse  15 mL Mouth Rinse QID  . sodium chloride flush  10-40 mL Intracatheter Q12H  . sodium chloride flush  3 mL Intravenous Q12H  . ticagrelor  90 mg Per Tube BID    Infusions: . sodium chloride 10 mL/hr at 07/11/2017 0800  . sodium chloride 35 mL/hr at 07/12/2017 2230  . amiodarone 60 mg/hr (07/22/17 0344)  . calcium gluconate    . famotidine (PEPCID) IV Stopped (07/04/2017 2237)  . fentaNYL infusion INTRAVENOUS 150 mcg/hr (07/06/2017 1909)  . impella catheter heparin 50 unit/mL in dextrose 5%    . insulin (NOVOLIN-R) infusion 8.6 Units/hr (07/22/17 0706)  . midazolam (VERSED) infusion 2 mg/hr (07/22/17 0441)  . milrinone 0.375 mcg/kg/min (07/22/17 0344)  . norepinephrine (LEVOPHED) Adult infusion 24 mcg/min (07/26/2017 2109)    PRN Medications: sodium chloride, acetaminophen (TYLENOL) oral liquid 160 mg/5 mL **OR** acetaminophen, bisacodyl, fentaNYL, levalbuterol, midazolam, ondansetron (ZOFRAN) IV, sodium chloride flush, sodium chloride flush    Patient Profile   [name] is a 84 y.o. female with h/o CAD s/p PCI to RCA and LAD 04/2013, PAF, On Eliquis, Diabetes, and HTN  Presented to [facility] 07/15/2017 with near-syncope, emesis, and CP. Emergently transferred to [facility] with EKG with anterolateral ST elevations. Taken emergently for Cath.   Assessment/Plan   1. Cardiogenic shock in setting of Acute MI - Output improved with Impella implant am of 07/14/2017. - Coox 70.9% on milrinone 0.375 and NE 24 -> 30. CO 4.93/CI2.56. - Cut milrinone back to 0.125 mcg/kg/min.  - Volume status elevated. CVP 13-14 - Continue to attempt diuresis with  lasix 40 mg TID. - Impella at P6 with 2.6-2.7 of flow. Good waveform.  - Not candidate for up-titration to impella 5.0 given age, multiple comorbidiites, and bleeding risk.   2. CAD s/p PCA with LAD DES - Relatively stable with mechanical support.  - Continue ASA and statin.   3. Acute hypoxic  respiratory failure - Remains intubated and sedated.  - FiO2 50%. 5 PEEP.   4. RLE ischemia - s/p emergent ex-vivo SFA bypass from Impella 07/11/2017 - Stable.   5. VF Arrest 07/27/2017 - Repeat LHC 07/07/2017 with no acute stent thrombosis.  - No further arrest.  - Keep K > 4.0 and Mg > 2.0. Supp ordered.   6. Paroxysmal Afib with RVR - Attempt DCCV x 3 07/07/2017, each time with return to Afib.  - Rate increased this am to 120-130s. May need to repeat DCCV.  - Continue amiodarone gtt - CHA2DS2/VAc at least 6.   7. Hypokalemia/Hypomagnesemia - goal K > 4.0 and Mg > 2.0. Stable this am. Follow.   8. ID - WBC trending up and BP down - Send BCx. - Start Vanc/Zosyn for ? Early sepsis.   Patient is critically ill and in danger of multiple system organ failure. Prognosis extremely guarded. Potentially plan for DCCV this am.   Medication concerns reviewed with patient and pharmacy team. Barriers identified: None at this time.   Length of Stay: 5  Annamaria Helling  07/22/2017, 7:18 AM  Advanced Heart Failure Team Pager [phone] (M-F; 7a - 4p)  Please contact [facility] for night-coverage after hours (4p -7a ) and weekends on [email or web address]  Agree.  She remains critically ill and appears worse today. Was in NSR last night but then reverted to AF with RVR this am. Remains on Impella P-6. NE increased to 30. Milrinone at 0.375. R foot appears a bit more mottled today but ex-vivo shunt intact. More acidotic on ABG.  WBC up to 19k. R groin area looks more swollen but hgb stable  Critically ill appearing on exam On vent agitated at times  JVP to ear Cor irreg tachy Lungs coarse Ab  obese NT Ext R groin impella are with soft hematoma + ex-vivo graft 2+ edema. R foot cool   Cardiac output improved but she is more hypotensive and acidotic. WBC climbing. SVR down. I suspect she may have component of sepsis. Will draw BCx. Start broad spectrum abx. Decrease milrinone to 0.125. Add vasopressin.   Prognosis increasingly grim .  Marland KitchenCRITICAL CARE Performed by: Glori Bickers  Total critical care time: 45 minutes  Critical care time was exclusive of separately billable procedures and treating other patients.  Critical care was necessary to treat or prevent imminent or life-threatening deterioration.  Critical care was time spent personally by me (independent of midlevel providers or residents) on the following activities: development of treatment plan with patient and/or surrogate as well as nursing, discussions with consultants, evaluation of patient's response to treatment, examination of patient, obtaining history from patient or surrogate, ordering and performing treatments and interventions, ordering and review of laboratory studies, ordering and review of radiographic studies, pulse oximetry and re-evaluation of patient's condition.  Glori Bickers, MD  9:43 AM

## 2017-07-22 NOTE — Progress Notes (Signed)
[facility] Progress Note Patient Name: [name] DOB: Nov 11, 1931 MRN: [id number]   Date of Service  07/22/2017  HPI/Events of Note  Hypokalemia  eICU Interventions  Potassium replaced     Intervention Category Intermediate Interventions: Electrolyte abnormality - evaluation and management  DETERDING,ELIZABETH 07/22/2017, 9:58 PM

## 2017-07-22 NOTE — Progress Notes (Signed)
PULMONARY / CRITICAL CARE MEDICINE   Name: [name] MRN: [id number] DOB: Jul 08, 1931    ADMISSION DATE:  07/13/2017 CONSULTATION DATE:  07/15/2017  REFERRING MD:  Dr. Einar Gip  CHIEF COMPLAINT:  Resp failure  HISTORY OF PRESENT ILLNESS:   HPI obtained from medical chart review as patient is obtunded and unable to provide.  84 year old female with PMH of noncompliance, CAD with prior MI in 2015, PAF with questionable compliance with Eliquis, HTN, DM and obesity admitted to Cardiology on 5/18 after presenting near syncope with chest pain.  Found to have anterior STEMI and taken to cath lab where she was found to have occluded LAD which was stented.    On 5/19, she had episode of VF and subsequent episodes of VT requiring multiple shocks.  Was alert and oriented post ROSC.  She was taken emergently back to cath lab on 5/19 but no culprit found.  She was going to be medically managed.  Additionally she developed afib with RVR from her baseline bradycardia requiring amiodarone gtt, metoprolol, and subsequently Cardizem for rate control.   She was doing well until the evening of 5/20 when she became diaphoretic and complained of shortness of breath.  She was placed on NRB and given lasix and then required BiPAP.  However she became obtunded and PCCM consulted for further management.   On preparation for intubation, patient developed vtach with pulse requiring cardioversion and antiarrhythmics.  She subsquently became hypotensive requiring vasopressor support, emergency intubation and central line placement.  Cardiology to bedside and preparing patient to return to cath lab for possible IABP vs impella.     SUBJECTIVE:  Pressor requirements increased  VITAL SIGNS: Blood Pressure 90/73   Pulse (Abnormal) 112   Temperature 98.8 F (37.1 C)   Respiration 13   Height 5\' 5"  (1.651 m)   Weight 218 lb 14.7 oz (99.3 kg)   Oxygen Saturation 99%   Body Mass Index 36.43 kg/m    HEMODYNAMICS: PAP: (37-73)/(17-44) 37/26 CVP:  [9 mmHg-29 mmHg] 16 mmHg PCWP:  [11 mmHg-18 mmHg] 17 mmHg CO:  [4.2 L/min-5.5 L/min] 4.9 L/min CI:  [2.2 L/min/m2-2.8 L/min/m2] 2.6 L/min/m2  VENTILATOR SETTINGS: Vent Mode: PRVC FiO2 (%):  [40 %-60 %] 50 % Set Rate:  [18 bmp] 18 bmp Vt Set:  [450 mL] 450 mL PEEP:  [5 cmH20] 5 cmH20 Plateau Pressure:  [18 cmH20-24 cmH20] 24 cmH20  INTAKE / OUTPUT: I/O last 3 completed shifts: In: 8051.4 [I.V.:4659.3; Other:486.5; MP/NT:614.4; IV Piggyback:2056.9] Out: 2368 [Urine:2368]  PHYSICAL EXAMINATION: General: This is a 84 year old acutely ill female she is on multiple pressors and the Impella pump.  She is now sedated. HEENT normocephalic atraumatic she does have some scleral edema, she is nonicteric.  Orally intubated. Pulmonary: Coarse breath sounds equal chest rise with mechanical ventilator diminished bases Plateau pressure 22 Cardiac: Irregular irregular with atrial fibrillation on telemetry currently on amiodarone infusion Extremities: Cool, mottled, purple discoloration particularly to the right lower extremity.  She is swollen extensively through her pelvis and right leg Neuro: Sedated on ventilator GU: Decreasing concentrated urine LABS:  BMET Recent Labs  Lab 07/06/2017 1527 07/12/2017 1816 07/22/17 0422  NA 132* 131* 131*  K 4.1 4.3 4.0  CL 105 102 100*  CO2 24 22 24   BUN 13 14 16   CREATININE 0.77 0.86 0.99  GLUCOSE 205* 182* 158*    Electrolytes Recent Labs  Lab 07/10/2017 1115 07/15/2017 1527 07/05/2017 1816 07/22/17 0422  CALCIUM  --  7.8*  7.9* 7.8*  MG 2.8*  --  2.2 2.0  PHOS <1.0*  --  3.1 2.8    CBC Recent Labs  Lab 07/02/2017 0505  07/30/2017 1527 07/12/2017 1816 07/22/17 0112 07/22/17 0422  WBC 15.6*  --  15.4*  --   --  19.3*  HGB 10.0*   < > 9.7* 10.1* 9.1* 9.2*  HCT 28.8*  --  28.7*  --   --  27.5*  PLT 176  --  168  --   --  170   < > = values in this interval not displayed.    Coag's Recent Labs   Lab 07/03/2017 1115 07/22/17 0422  APTT 87* 72*    Sepsis Markers Recent Labs  Lab 07/01/2017 0215  07/06/2017 2043 07/30/2017 0006 07/29/2017 0306  LATICACIDVEN  --    < > 3.7* 2.7* 2.6*  PROCALCITON 0.27  --   --   --   --    < > = values in this interval not displayed.    ABG Recent Labs  Lab 07/22/17 0422 07/22/17 0435 07/22/17 0538  PHART 7.295* TEST WILL BE CREDITED 7.297*  PCO2ART 46.7 TEST WILL BE CREDITED 46.4  PO2ART 170.0* TEST WILL BE CREDITED 171.0*    Liver Enzymes Recent Labs  Lab 07/11/2017 0505 07/10/2017 1527 07/22/17 0422  AST 123* 102* 93*  ALT 124* 121* 117*  ALKPHOS 70 79 86  BILITOT 1.6* 1.7* 2.5*  ALBUMIN 2.0* 2.0* 2.2*    Cardiac Enzymes Recent Labs  Lab 07/27/2017 0600 07/23/2017 1358 07/19/2017 2043  TROPONINI 25.70* 27.12* 36.12*    Glucose Recent Labs  Lab 07/22/17 0305 07/22/17 0350 07/22/17 0500 07/22/17 0602 07/22/17 0705 07/22/17 0809  GLUCAP 158* 151* 161* 152* 167* 163*    Imaging Dg Chest Port 1 View  Result Date: 07/22/2017 CLINICAL DATA:  Evaluate endotracheal, LVAD present EXAM: PORTABLE CHEST 1 VIEW COMPARISON:  Portable chest x-ray of 07/04/2017 FINDINGS: The tip of the endotracheal tube is approximately 2.4 cm above the carina. There is little change in pulmonary vascular congestion and probable small bilateral pleural effusions. Cardiomegaly is stable. Impella LVAD device appears unchanged in position. Swan-Ganz catheter from below again projects over the right pulmonary artery and left central venous line tip overlies the mid SVC. IMPRESSION: 1. Little change in pulmonary vascular congestion and small effusions. 2. Stable cardiomegaly with LVAD device present. 3. Endotracheal tube tip 2.4 cm above the carina. Electronically Signed   By: Ivar Drape M.D.   On: 07/22/2017 09:24   STUDIES:  5/18 LHC >> prox LAD 100% stenosed with DES placed, LVEDP elevated at 30  5/19 LHC >> stents patent; no culprit found   5/20 TTE  >> Left ventricle: The cavity size was normal. There was mild   concentric hypertrophy. The estimated ejection fraction was in   the range of 30% to 35%. Could not assess LV diastolic function   due to atrial fibrillation. - Regional wall motion abnormality: Akinesis of the apical septal   and apical myocardium; severe hypokinesis of the mid-apical   anterior and mid anteroseptal myocardium. There is also mild   global hypokinesis. GLS severely reduced at -6.9% - Left atrium: The atrium was moderately dilated. - Tricuspid valve: Mildly dilated annulus. There was mild-moderate   regurgitation. - Pulmonary arteries: PA peak pressure: 54 mm Hg (S).  CULTURES: 5/18 MRSA PCR >> neg Blood cultures 5/23 ANTIBIOTICS: Vancomycin 5/23 Zosyn 5/23  SIGNIFICANT EVENTS: 5/18 Admitted, anterior STEMI/ LHC/ stent 5/19  vfib/ vtach arrest, multiple shocks, LHC 5/21 respiratory distress, vtach, shock, back to cath lab  LINES/TUBES: PIV  5/21 ETT >> 5/21 L Holmes CVL >> 5/21 R aline >> 5/21 foley   DISCUSSION: 45 yoF admitted 5/18 with large anterior wall STEMI s/p stent to prox LAD.  On 5/19, went into Vtach/vfib arrest with intact mental status post ROSC, taken back to cath without culprit found with subsequent afib with rvr.   Developed respiratory distress, shock, and recurrent arrhythmias requiring cardioversion during the night 5/20, requiring intubation and vasopressor support.  Patient to return to cath lab for placement of impella per Cards.  Now has bypass sheath on right lower extremity which is help with perfusion.  Chest x-ray continues to improve however she is in refractory shock and appears to be going into multiorgan failure.  I am concerned she will not improve from a cardiac function and will soon go into multiorgan failure.  I have made ventilator changes to address her ventilator compliance, we will repeat ABG to assure acid-base is optimize given worsening pressor requirements.   Ultimately I feel she will not do well  ASSESSMENT / PLAN:  Acute hypoxic respiratory failure- related to cardiogenic shock/ pulmonary edema  Portable chest x-ray reviewed: Support tubes and lines are in satisfactory position.  Pulmonary edema improved.   plan Continuing full ventilator support, change of volume control which seemed to result in improved ventilator compliance Repeating arterial blood gas PAD protocol Follow-up a.m. chest x-ray   Large wall anterior STEMI 5/18 s/p stent to prox LAD Cardiogenic shock Afib w/ RVR status post cardioversion with only brief transition to sinus rhythm  recurrent Vtach - TTE 5/20, EF 30-35%, global hypokinesis, PAP 54 mm Hg -Impella initiated 5/21 -Pressor needs: Remains on levo fed and also Impella, pressor requirements have increased over the evening Plan Continue amiodarone drip, as well as milrinone and norepinephrine Mean arterial pressure goal is greater than 65 Impella management per primary team Lasix drip started Checking arterial blood gas to ensure pH not contributing to worsening shock  right lower extremity limb ischemia status post ex vivo bypass to SFA on 5/21 with improved perfusion -Still appears to have some degree of ischemia but no compartment syndrome vascular surgery following daily Plan Deferring anticoagulation to primary team and surgical services   At risk for AKI given contrast and shock, oliguric as of 5/23 Plan Mean arterial pressure goal greater than 65 Renal dose medications Strict intake output I do not think she is a good candidate for dialysis if renal function continues to decline  Leukocytosis-afebrile but white blood cells climbing, meet SIRS criteria, rule out sepsis Plan Blood culture sent 5/23 Day number 1 vancomycin and Zosyn empiric for nosocomial coverage   DM -Glycemic control is acceptable Plan Doing sliding scale insulin  Acute encephalopathy - intact mental status following  arrest 5/19; placed on sedation last night on 5/22 due to ventilator compliance and agitation Plan RASS goal -1 to -2 PAD protocol Supportive care  FAMILY  - Updates: Daughters updated at bedside per Dr. Jimmey Ralph  - Inter-disciplinary family meet or Palliative Care meeting due by:  5/28   DVT prophylaxis: heparin gtt SUP H2B Diet: NPO-->start nutrition Activity: BR Disposition : ICU critically ill   Erick Colace ACNP-BC [facility] Pager # [phone] OR # [phone] if no answer  Pager # [phone] OR # [phone] if no answer

## 2017-07-22 NOTE — Care Management Note (Signed)
Case Management Note  Patient Details  Name: [name] MRN: [id number] Date of Birth: 04-10-1931  Subjective/Objective:  Pt admitted with MI                  Action/Plan:   PTA independent from home alone.  Pt will discharge to daughters home to allow for additional recovery prior to returning home.  Pt has PCP and is able to afford prescription medications.  Per daughters - pt may benefit from [facility] to assist with medication management - CM requested order.  CM submitted benefit check for Brilinta - CM will provide free 30 card prior to discharge.  CM will continue to follow for discharge needs   Expected Discharge Date:                  Expected Discharge Plan:  [facility]  In-House Referral:     Discharge planning Services  CM Consult  Post Acute Care Choice:    Choice offered to:     DME Arranged:    DME Agency:     HH Arranged:    HH Agency:     Status of Service:     If discussed at [employer], dates discussed:    Additional Comments 07/22/2017  Pt remains unstable on vent and pressors.  If pt recovers pt will require Life Vest to be set up and Brilinta initiation.  CM will continue to follow for discharge needs  07/02/2017 Pt unfortunately requiring intubation and pressors overnight.  CM left Life Vest form on pts shadow chart for physician to fill out once approrpiate   07/19/17 Update:  Pt will discharge home with life vest, form placed on shadow chart for physician to complete.  Life Vest aware of pending referral Oneal Grout 07/22/2017, 4:17 PM

## 2017-07-22 NOTE — Progress Notes (Signed)
  Patient seen on evening rounds.   Remains intubated/sedated  This am became unstable with hypotension and progressive leukocytosis. NE requirement up to 30.   Cultured and started on broad spectrum abx. Vasopressin added and milrinone cut back.   Has responded well to Vasopressin. NE cut back to 8. Good urine output on lasix gtt. Remains in AF but rates better in 90s.   Impella waveforms look good. Output 2.5L on p-g  CO 4.5L on swan.   RLE stable perfusion.   Discussed with family at length.   45 min additional CCT.   Glori Bickers, MD  5:03 PM

## 2017-07-22 NOTE — Progress Notes (Signed)
Subjective:  Intubated, sedated  Interval events: Impella repositioned in cath lab on 07/27/2017. Decreasing norepinephrine requirement through the day. Patient went into Afib early 07/22/17. Since then, urine output has come down to 20 cc/hr, norepniphrine up to 24. Vent setting stable. Minimal blood tinged secretions through ETT. Rt leg remains cooler, but with Doppler signals in distal SFA sheath.  Objective:  Vital Signs in the last 24 hours: Temp:  [98.6 F (37 C)-99.5 F (37.5 C)] 99 F (37.2 C) (05/23 5397) Pulse Rate:  [30-161] 115 (05/23 0638) Resp:  [0-34] 14 (05/23 0638) BP: (90-128)/(55-76) 90/73 (05/22 1137) SpO2:  [0 %-100 %] 100 % (05/23 0638) FiO2 (%):  [40 %-60 %] 50 % (05/23 0355) Weight:  [99.3 kg (218 lb 14.7 oz)] 99.3 kg (218 lb 14.7 oz) (05/23 6734)  Intake/Output from previous day: 05/22 0701 - 05/23 0700 In: 5104.7 [I.V.:3228.3; NG/GT:848.7; IV Piggyback:710] Out: 1975 [LPFXT:0240] Intake/Output from this shift: No intake/output data recorded.  Physical Exam: Constitutional: Intubated, sedated HENT:  Head: Atraumatic.  Eyes: Conjunctivae are normal.  Neck: Neck supple.  Cardiovascular:  Tachycardic, S1 normal, S2 normal, no murmur appreciated.  Right radial cath site with no bleeding, hematoma. Rt femoral Impella with 11F sheath. Rt CFV Swan catheter Extremities. Distal perfusion sheath placement in right prox SFA. RLE cool to touch. Minimal mottling in toes. Right thigh hematoma. Stable. Pulmonary/Chest: Effort normal and breath sounds reduced at bases.  Abdominal: Soft.  Musculoskeletal: Normal range of motion.  Neurological: She is alert and oriented to person, place, and time.  Skin: Skin is warm and dry.     Lab Results: Recent Labs    07/06/2017 1527  07/22/17 0112 07/22/17 0422  WBC 15.4*  --   --  19.3*  HGB 9.7*   < > 9.1* 9.2*  PLT 168  --   --  170   < > = values in this interval not displayed.   Recent Labs    07/08/2017 1816  07/22/17 0422  NA 131* 131*  K 4.3 4.0  CL 102 100*  CO2 22 24  GLUCOSE 182* 158*  BUN 14 16  CREATININE 0.86 0.99   Recent Labs    07/05/2017 1358 07/28/2017 2043  TROPONINI 27.12* 36.12*   Hepatic Function Panel Recent Labs    07/22/17 0422  PROT 4.7*  ALBUMIN 2.2*  AST 93*  ALT 117*  ALKPHOS 86  BILITOT 2.5*   No results for input(s): CHOL in the last 72 hours. Imaging: CXR 07/28/2017: CLINICAL DATA:  Cough  EXAM: PORTABLE CHEST 1 VIEW  COMPARISON:  07/01/2017, 11/30/2014  FINDINGS: Asymmetrical right greater than left interstitial opacity, slight decreased compared to prior. Cardiomegaly. Aortic atherosclerosis. No pleural effusion or pneumothorax.  IMPRESSION: Asymmetrical right greater than left interstitial opacity, possible asymmetric edema. Cardiomegaly.   Cardiac Studies: EKG 07/22/2017: A. fib with RVR at the rate of 130 bpm, ST elevation in 1 and aVL and V3 to V6, new since EKG performed earlier this morning patient was in sinus rhythm with poor R wave progression and lateral T wave inversion.  Limited echocardiogram 07/07/2017: Left ventricle:  Moderate diffuse hypokinesis with severe mid to distal LAD territory hypokinesis. Apical dyskinesis. LVEF 15-20%. better visuaulization of LV apex compared to previous study on 07/19/2017. LVEF appears to have decreased further. No LV thrombus seen on non-contrast study. Impella catheter seen in LV cavity, measuring 5 cm from distal tip to aortic valve. Repositioned, now measures 4.1 cm from aortic valve.  Echocardiogram 07/19/2017: Study Conclusions  - Left ventricle: The cavity size was normal. There was mild   concentric hypertrophy. The estimated ejection fraction was in   the range of 30% to 35%. Could not assess LV diastolic function   due to atrial fibrillation. - Regional wall motion abnormality: Akinesis of the apical septal   and apical myocardium; severe hypokinesis of the  mid-apical   anterior and mid anteroseptal myocardium. There is also mild   global hypokinesis. GLS severely reduced at -6.9% - Left atrium: The atrium was moderately dilated. - Tricuspid valve: Mildly dilated annulus. There was mild-moderate   regurgitation. - Pulmonary arteries: PA peak pressure: 54 mm Hg (S).   Outpatient echocardiogram 09/24/2015: Left ventricle cavity is normal in size. Mild concentric hypertrophy of the left ventricle. Normal global wall motion. Doppler evidence of grade I (impaired) diastolic dysfunction. Calculated EF 63%. Trace mitral regurgitation. Mild calcification of the mitral valve annulus. Trace tricuspid regurgitation. Unable to estimate PA pressure due to absence/minimal TR signal. Overall no change in study since 12/26/2013.   07/16/2017: Procedures   CORONARY/GRAFT ACUTE MI REVASCULARIZATION  LEFT HEART CATH AND CORONARY ANGIOGRAPHY  Conclusion     Previously placed Prox LAD to Mid LAD stent (unknown type) is widely patent.  Prox Cx lesion is 25% stenosed.  Prox RCA to Dist RCA lesion is 20% stenosed.  Post Atrio lesion is 75% stenosed.  LV end diastolic pressure is moderately elevated.  There is no aortic valve stenosis.  Prox LAD lesion is 100% stenosed.  A drug-eluting stent was successfully placed using a STENT SYNERGY DES 3.5X20.  Post intervention, there is a 0% residual stenosis.  1st Diag lesion is 100% stenosed. The occlusion is very distal.   Recommend uninterrupted dual antiplatelet therapy with Aspirin 86m QD and Ticagrelor 933mBID for a minimum of 12 months (ACS - Class I recommendation).   The patient was on Eliquis, but compliance was an issue.  She had apparently not taken any for a few days prior to the MI. If it is decided that Eliquis is a good idea, then the duration of antiplatelet therapy would have to be reduced.    She will need an echo to evaluate LV function.  I suspect that her LV function will be  significantly decreased.   Holding OV fluids due to elevated LVEDP.     07/10/2017: Procedures   LEFT HEART CATH AND CORONARY ANGIOGRAPHY  Conclusion   Coronary angiogram 07/11/2017: Widely patent prior stents including  S/P stenting of proximal LAD with 3.5 x 20 mm Synergy on 07/16/2017. History of non-STEMI and PTCA with overlapping stents on 04/18/2013 with implantation of a 3.0 x 15 mm and 3.0 x 38 mm Xience DES in the proximal and mid LAD and staged intervention to the right coronary artery with implantation of a 3.0 x 38 mm x 2 DES. The diagonal 1 branch which is occluded due to thrombotic embolic complication, was widely patent with TIMI-3 flow with no residual stenosis.  Right coronary artery in-stent 30 to 40% stenosis, PL branch has a 70 to 75% stenosis.  Not culprit for acute presentation.  Recommendation: Suspect her VF arrest and sustained VT, symptomatic needing defibrillation was related to acute anterior myocardial infarction.  We will continue medical therapy for now.  Patient has baseline bradycardia and has not been able to tolerate beta-blockers.  However she is now in A. fib with RVR, I will start her on metoprolol IV 5 mg every 6  hours to control her heart rate.  I suspect she may end up needing a permanent pacemaker.  I have met with the family and discussed in detail.     07/11/2017: Procedures   RIGHT HEART CATH  VENTRICULAR ASSIST DEVICE INSERTION  Conclusion   Post MI cardiogenic shock Elevated LVEDP, PAWP Successful Impella CP placement  Recommendation: Continue monitoring in ICU Continue norepinephrine for now IV lasix 40 mg bid. Adjust as necessary Serial lactic acid, echocardiogram Strict I/O Stop apixaban. Continue heparin infusion Continue ASA/ticagrelor    Assessment: 84 y/o Caucasian female Cardiogenic shock: S/p Large anterior wall MI 07/03/2017 S/p Impella CP placement 07/13/2017 Lactic acid elevation: Labile.  Mild increase in  creatinine. Not amounting to AKI RLE iscehmia: combination of vasoconstriction 2/2 cardiogenic shock, pressors, and Impella placement. Now has distal repositioning sheath placed by Dr. Donzetta Matters. Improved perfusion. Monitor for compartment syndrome. Right thigh hematoma: Stable. Received 1 U PRBC on 07/28/2017/ S/p primary PCI to mid LAD 07/26/2017 Synergy DES 3.5 X 20 mm DES VF arrest 07/08/2017: Coronary angiogram with no acute stent thrombosis Paroxysmal atrial fibrillation, currently in RVR. CHA2DS2VASc score 5 Type 2 DM H/o meningioma  Recommendations: Critically ill patient with high risk for mortality. Continue hemodynamic support with Impella CP. Attempt to wean off norepinephrine, if possible. This is limited due to tenuous MAP's while in Afib. Continue IV amiodarone with bolus this morning. Attempt to perform DCCV later today. IV lasix 20 mg this morning. Continue IV milrinone. Maintain CVP 12-14 Evaluated by Dr. Prescott Gum. Not a candidate for uptitration to Impella 5.0 given age, multiple comorbidities, and bleeding risk. Serial measurement of lactic acid Continue ASA/ticagrelor/heparin. Continue Lipitor 80 mg   I have discussed patient's medical condition, prognosis in detail with the family multiple times through the day. High risk mortality due to large anterior wall MI, VF arrest, cardiogenic shock, RLE ischemia. Mortality >40%. Family understand the prognosis and would like [place] to continue all aggressive measures at this time.   Appreciate input from Dr. Haroldine Laws, Dr. Prescott Gum, and Drs. Yetta Barre (Vascular surgery) Appreciate PCCM support.   LOS: 5 days    Manish J Patwardhan 07/22/2017, 7:51 AM  [place], MD [facility] [place] Pager: [phone] Office: [phone] If no answer Cell [phone]

## 2017-07-22 NOTE — Progress Notes (Signed)
ANTICOAGULATION CONSULT NOTE - [place] for heparin Indication: Impella  Allergies  Allergen Reactions  . Sulfonamide Derivatives Other (See Comments)    REACTION: childhood unknown type, ended up in hospital    Patient Measurements: Height: '5\' 5"'$  (165.1 cm) Weight: 218 lb 14.7 oz (99.3 kg) IBW/kg (Calculated) : 57 Heparin Dosing Weight: 75.4 kg   Vital Signs: Temp: 99 F (37.2 C) (05/23 0638) Pulse Rate: 115 (05/23 0638)  Labs: Recent Labs    07/08/2017 0600  07/15/2017 1358  07/26/2017 2043  07/15/2017 0505 07/16/2017 1115 07/16/2017 1527 06/30/2017 1816 07/22/17 0112 07/22/17 0422  HGB 13.2   < >  --    < >  --    < > 10.0* 9.8* 9.7* 10.1* 9.1* 9.2*  HCT 42.1   < >  --    < >  --    < > 28.8*  --  28.7*  --   --  27.5*  PLT  --    < >  --    < >  --    < > 176  --  168  --   --  170  APTT  --   --   --   --   --   --   --  87*  --   --   --  72*  HEPARINUNFRC  --   --   --   --   --   --   --  0.98*  --   --   --   --   CREATININE  --    < > 0.89   < >  --    < > 0.79  --  0.77 0.86  --  0.99  TROPONINI 25.70*  --  27.12*  --  36.12*  --   --   --   --   --   --   --    < > = values in this interval not displayed.    Estimated Creatinine Clearance: 50.2 mL/min (by C-G formula based on SCr of 0.99 mg/dL).   Medical History: Past Medical History:  Diagnosis Date  . Allergic rhinitis due to other allergen 06/14/2008   Qualifier: Diagnosis of  By: Arnoldo Morale MD, Balinda Quails   . Cellulitis and abscess of neck 03/18/2007   Qualifier: Diagnosis of  By: Arnoldo Morale MD, Balinda Quails   . Diabetes mellitus   . Fibrocystic breast disease   . Hypertension   . Meningioma ([place])   . SEBACEOUS CYST, NECK 02/08/2007   Qualifier: Diagnosis of  By: Arnoldo Morale MD, Balinda Quails   . Tendon tear, ankle     Medications:  Scheduled:  . aspirin  81 mg Per Tube Daily  . atorvastatin  80 mg Per Tube Daily  . chlorhexidine gluconate (MEDLINE KIT)  15 mL Mouth Rinse BID  . chlorhexidine  gluconate (MEDLINE KIT)  15 mL Mouth Rinse BID  . Chlorhexidine Gluconate Cloth  6 each Topical Daily  . digoxin  0.125 mg Intravenous Daily  . docusate  100 mg Per Tube BID  . feeding supplement (PRO-STAT SUGAR FREE 64)  30 mL Per Tube BID  . feeding supplement (VITAL HIGH PROTEIN)  1,000 mL Per Tube Q24H  . fentaNYL (SUBLIMAZE) injection  50 mcg Intravenous Once  . furosemide  40 mg Intravenous TID  . ipratropium  0.5 mg Nebulization Q6H  . mouth rinse  15 mL Mouth Rinse QID  . sodium chloride  flush  10-40 mL Intracatheter Q12H  . sodium chloride flush  3 mL Intravenous Q12H  . ticagrelor  90 mg Per Tube BID    Assessment: 42 yoF admitted with STEMI with DES to LAD now s/p VF arrest with cardiogenic shock requiring mechanical support with Impella. Pt noted to have large R groin hematoma associated with arterial bleeding with drop in Hgb, so initially heparin only in purge with low concentration (6.25 units/ml) later increased with subtherapeutic ACTs.  Heparin now in purge at full concentration (50 units/ml) running at 14 ml/hr (700 units/hr). ACTs have been at goal ~175 with purge alone. Of note, pt has had continuous rapid AFib since 5/21 pm with several failed cardioversions. aPTT therapeutic at 72 seconds with heparinized purge alone and heparin level elevated due to recent apixaban use, so will hold off on adding systemic heparin to cover AFib at this time.  Goal of Therapy:  ACT 160-180 sec Monitor platelets by anticoagulation protocol: Yes   Plan:  -Continue heparinized purge solution -Follow ACTs per RN protocol -Daily aPTT, heparin level, CBC  Arrie Senate, PharmD, BCPS PGY-2 Cardiology Pharmacy Resident Pager: [phone] 07/22/2017

## 2017-07-23 ENCOUNTER — Inpatient Hospital Stay (HOSPITAL_COMMUNITY): Payer: Medicare HMO

## 2017-07-23 ENCOUNTER — Encounter (HOSPITAL_COMMUNITY): Payer: Self-pay | Admitting: Anesthesiology

## 2017-07-23 LAB — CBC WITH DIFFERENTIAL/PLATELET
ABS IMMATURE GRANULOCYTES: 0.5 10*3/uL — AB (ref 0.0–0.1)
Basophils Absolute: 0 10*3/uL (ref 0.0–0.1)
Basophils Relative: 0 %
EOS PCT: 0 %
Eosinophils Absolute: 0 10*3/uL (ref 0.0–0.7)
HEMATOCRIT: 23.4 % — AB (ref 36.0–46.0)
HEMOGLOBIN: 7.9 g/dL — AB (ref 12.0–15.0)
Immature Granulocytes: 3 %
LYMPHS ABS: 0.8 10*3/uL (ref 0.7–4.0)
LYMPHS PCT: 5 %
MCH: 28.2 pg (ref 26.0–34.0)
MCHC: 33.8 g/dL (ref 30.0–36.0)
MCV: 83.6 fL (ref 78.0–100.0)
Monocytes Absolute: 1.2 10*3/uL — ABNORMAL HIGH (ref 0.1–1.0)
Monocytes Relative: 8 %
NEUTROS ABS: 13.2 10*3/uL — AB (ref 1.7–7.7)
Neutrophils Relative %: 84 %
PLATELETS: 134 10*3/uL — AB (ref 150–400)
RBC: 2.8 MIL/uL — AB (ref 3.87–5.11)
RDW: 14.3 % (ref 11.5–15.5)
WBC: 15.7 10*3/uL — ABNORMAL HIGH (ref 4.0–10.5)

## 2017-07-23 LAB — COMPREHENSIVE METABOLIC PANEL
ALBUMIN: 1.9 g/dL — AB (ref 3.5–5.0)
ALK PHOS: 84 U/L (ref 38–126)
ALT: 99 U/L — AB (ref 14–54)
AST: 80 U/L — ABNORMAL HIGH (ref 15–41)
Anion gap: 10 (ref 5–15)
BUN: 22 mg/dL — ABNORMAL HIGH (ref 6–20)
CALCIUM: 7.4 mg/dL — AB (ref 8.9–10.3)
CO2: 26 mmol/L (ref 22–32)
CREATININE: 1.05 mg/dL — AB (ref 0.44–1.00)
Chloride: 95 mmol/L — ABNORMAL LOW (ref 101–111)
GFR calc Af Amer: 55 mL/min — ABNORMAL LOW (ref >60)
GFR calc non Af Amer: 48 mL/min — ABNORMAL LOW (ref >60)
GLUCOSE: 152 mg/dL — AB (ref 65–99)
Potassium: 3.1 mmol/L — ABNORMAL LOW (ref 3.5–5.1)
SODIUM: 131 mmol/L — AB (ref 135–145)
Total Bilirubin: 3.1 mg/dL — ABNORMAL HIGH (ref 0.3–1.2)
Total Protein: 4.5 g/dL — ABNORMAL LOW (ref 6.5–8.1)

## 2017-07-23 LAB — POCT ACTIVATED CLOTTING TIME
ACTIVATED CLOTTING TIME: 169 s
Activated Clotting Time: 169 seconds
Activated Clotting Time: 169 seconds
Activated Clotting Time: 169 seconds
Activated Clotting Time: 169 seconds

## 2017-07-23 LAB — COOXEMETRY PANEL
Carboxyhemoglobin: 1.2 % (ref 0.5–1.5)
Methemoglobin: 1.3 % (ref 0.0–1.5)
O2 SAT: 71.6 %
TOTAL HEMOGLOBIN: 7.7 g/dL — AB (ref 12.0–16.0)

## 2017-07-23 LAB — GLUCOSE, CAPILLARY
GLUCOSE-CAPILLARY: 176 mg/dL — AB (ref 65–99)
GLUCOSE-CAPILLARY: 171 mg/dL — AB (ref 65–99)
GLUCOSE-CAPILLARY: 165 mg/dL — AB (ref 65–99)
GLUCOSE-CAPILLARY: 170 mg/dL — AB (ref 65–99)
GLUCOSE-CAPILLARY: 176 mg/dL — AB (ref 65–99)
GLUCOSE-CAPILLARY: 136 mg/dL — AB (ref 65–99)
GLUCOSE-CAPILLARY: 150 mg/dL — AB (ref 65–99)
GLUCOSE-CAPILLARY: 147 mg/dL — AB (ref 65–99)
GLUCOSE-CAPILLARY: 151 mg/dL — AB (ref 65–99)
GLUCOSE-CAPILLARY: 152 mg/dL — AB (ref 65–99)
GLUCOSE-CAPILLARY: 162 mg/dL — AB (ref 65–99)
Glucose-Capillary: 184 mg/dL — ABNORMAL HIGH (ref 65–99)
Glucose-Capillary: 151 mg/dL — ABNORMAL HIGH (ref 65–99)
Glucose-Capillary: 136 mg/dL — ABNORMAL HIGH (ref 65–99)
Glucose-Capillary: 148 mg/dL — ABNORMAL HIGH (ref 65–99)
Glucose-Capillary: 133 mg/dL — ABNORMAL HIGH (ref 65–99)
Glucose-Capillary: 178 mg/dL — ABNORMAL HIGH (ref 65–99)
Glucose-Capillary: 163 mg/dL — ABNORMAL HIGH (ref 65–99)
Glucose-Capillary: 135 mg/dL — ABNORMAL HIGH (ref 65–99)
Glucose-Capillary: 138 mg/dL — ABNORMAL HIGH (ref 65–99)
Glucose-Capillary: 152 mg/dL — ABNORMAL HIGH (ref 65–99)
Glucose-Capillary: 151 mg/dL — ABNORMAL HIGH (ref 65–99)

## 2017-07-23 LAB — POCT I-STAT 3, ART BLOOD GAS (G3+)
ACID-BASE EXCESS: 3 mmol/L — AB (ref 0.0–2.0)
ACID-BASE EXCESS: 2 mmol/L (ref 0.0–2.0)
Bicarbonate: 25.8 mmol/L (ref 20.0–28.0)
Bicarbonate: 27.3 mmol/L (ref 20.0–28.0)
O2 SAT: 97 %
O2 Saturation: 99 %
PCO2 ART: 33.8 mmHg (ref 32.0–48.0)
TCO2: 27 mmol/L (ref 22–32)
TCO2: 29 mmol/L (ref 22–32)
pCO2 arterial: 42.5 mmHg (ref 32.0–48.0)
pH, Arterial: 7.492 — ABNORMAL HIGH (ref 7.350–7.450)
pH, Arterial: 7.415 (ref 7.350–7.450)
pO2, Arterial: 111 mmHg — ABNORMAL HIGH (ref 83.0–108.0)
pO2, Arterial: 92 mmHg (ref 83.0–108.0)

## 2017-07-23 LAB — HEMOGLOBIN
Hemoglobin: 7.9 g/dL — ABNORMAL LOW (ref 12.0–15.0)
Hemoglobin: 7.8 g/dL — ABNORMAL LOW (ref 12.0–15.0)

## 2017-07-23 LAB — CBC
HEMATOCRIT: 22.7 % — AB (ref 36.0–46.0)
Hemoglobin: 7.7 g/dL — ABNORMAL LOW (ref 12.0–15.0)
MCH: 28.3 pg (ref 26.0–34.0)
MCHC: 33.9 g/dL (ref 30.0–36.0)
MCV: 83.5 fL (ref 78.0–100.0)
PLATELETS: 123 10*3/uL — AB (ref 150–400)
RBC: 2.72 MIL/uL — ABNORMAL LOW (ref 3.87–5.11)
RDW: 14.6 % (ref 11.5–15.5)
WBC: 14.9 10*3/uL — AB (ref 4.0–10.5)

## 2017-07-23 LAB — PREPARE RBC (CROSSMATCH)

## 2017-07-23 LAB — APTT: APTT: 73 s — AB (ref 24–36)

## 2017-07-23 LAB — HEPARIN LEVEL (UNFRACTIONATED): Heparin Unfractionated: 0.66 IU/mL (ref 0.30–0.70)

## 2017-07-23 LAB — MAGNESIUM: Magnesium: 1.6 mg/dL — ABNORMAL LOW (ref 1.7–2.4)

## 2017-07-23 LAB — ECHOCARDIOGRAM LIMITED
HEIGHTINCHES: 65 in
Weight: 3365.1 oz

## 2017-07-23 LAB — POTASSIUM: POTASSIUM: 3.6 mmol/L (ref 3.5–5.1)

## 2017-07-23 LAB — PHOSPHORUS: Phosphorus: 1.5 mg/dL — ABNORMAL LOW (ref 2.5–4.6)

## 2017-07-23 LAB — POCT I-STAT 4, (NA,K, GLUC, HGB,HCT)
Glucose, Bld: 178 mg/dL — ABNORMAL HIGH (ref 65–99)
HCT: 20 % — ABNORMAL LOW (ref 36.0–46.0)
HEMOGLOBIN: 6.8 g/dL — AB (ref 12.0–15.0)
POTASSIUM: 3 mmol/L — AB (ref 3.5–5.1)
SODIUM: 132 mmol/L — AB (ref 135–145)

## 2017-07-23 MED ORDER — MAGNESIUM SULFATE 4 GM/100ML IV SOLN
4.0000 g | Freq: Once | INTRAVENOUS | Status: AC
  Administered 2017-07-23: 4 g via INTRAVENOUS
  Filled 2017-07-23: qty 100

## 2017-07-23 MED ORDER — POTASSIUM CHLORIDE 20 MEQ/15ML (10%) PO SOLN
40.0000 meq | Freq: Once | ORAL | Status: AC
  Administered 2017-07-23: 40 meq via ORAL
  Filled 2017-07-23: qty 30

## 2017-07-23 MED ORDER — FAMOTIDINE IN NACL 20-0.9 MG/50ML-% IV SOLN
20.0000 mg | INTRAVENOUS | Status: DC
  Administered 2017-07-23 – 2017-07-24 (×2): 20 mg via INTRAVENOUS
  Filled 2017-07-23 (×2): qty 50

## 2017-07-23 MED ORDER — POTASSIUM CHLORIDE 20 MEQ/15ML (10%) PO SOLN
ORAL | Status: AC
  Administered 2017-07-23: 30 meq
  Filled 2017-07-23: qty 30

## 2017-07-23 MED ORDER — POTASSIUM CHLORIDE 10 MEQ/50ML IV SOLN
10.0000 meq | INTRAVENOUS | Status: AC
  Administered 2017-07-23 (×5): 10 meq via INTRAVENOUS
  Filled 2017-07-23 (×2): qty 50

## 2017-07-23 MED ORDER — POTASSIUM CHLORIDE 20 MEQ/15ML (10%) PO SOLN
30.0000 meq | ORAL | Status: AC
  Administered 2017-07-23: 30 meq
  Filled 2017-07-23: qty 30

## 2017-07-23 MED ORDER — SODIUM CHLORIDE 0.9 % IV SOLN
Freq: Once | INTRAVENOUS | Status: AC
  Administered 2017-07-24: via INTRAVENOUS

## 2017-07-23 MED ORDER — SPIRONOLACTONE 25 MG PO TABS
25.0000 mg | ORAL_TABLET | Freq: Every day | ORAL | Status: DC
  Administered 2017-07-23 – 2017-07-24 (×2): 25 mg
  Filled 2017-07-23 (×2): qty 1

## 2017-07-23 MED FILL — Medication: Qty: 1 | Status: AC

## 2017-07-23 NOTE — Progress Notes (Addendum)
Advanced Heart Failure Rounding Note  PCP-Cardiologist: No primary care provider on file.   Subjective:    Drips: Amio 60 mg/hr NE 9 mcg Vaso 0.03 Milrinone 0.0.125  Became hypotensive with likely early sepsis am of 07/22/17. Started on vasopressin, milrinone cut back, and started on VAnc/Zosyn. BCx sent prior to ABX.   Coox 71.6% this am on Milrinone 0.125, NE 9, and Vasopressin 0.03.   Impella at P6 with 2.6 flow. Good waveform.  CVP 11-12  Intubated and sedated. Intermittently awakes and follows commands, but pressures drop with agitation  Cr 1.05. K 3.1/ Mg 1.6. WBC 15.7.   Swan numbers PA 42/13 (22) CVP 11-12 CO 5.2 CI 2.7  Objective:   Weight Range: 216 lb 14.9 oz (98.4 kg) Body mass index is 36.1 kg/m.   Vital Signs:   Temp:  [98.8 F (37.1 C)-99.9 F (37.7 C)] 99.1 F (37.3 C) (05/24 0600) Pulse Rate:  [66-131] 66 (05/24 0600) Resp:  [11-24] 24 (05/24 0600) SpO2:  [94 %-100 %] 95 % (05/24 0600) FiO2 (%):  [40 %-50 %] 40 % (05/24 0313) Weight:  [216 lb 14.9 oz (98.4 kg)] 216 lb 14.9 oz (98.4 kg) (05/24 0600) Last BM Date: 07/16/17  Weight change: Filed Weights   07/29/2017 0600 07/22/17 0637 07/23/17 0600  Weight: 210 lb 5.1 oz (95.4 kg) 218 lb 14.7 oz (99.3 kg) 216 lb 14.9 oz (98.4 kg)    Intake/Output:   Intake/Output Summary (Last 24 hours) at 07/23/2017 0722 Last data filed at 07/23/2017 0700 Gross per 24 hour  Intake 4528.06 ml  Output 4310 ml  Net 218.06 ml      Physical Exam    General: Intubated/sedated.  HEENT: + ETT Neck: Supple. JVP ~8. Carotids 2+ bilat; no bruits. No thyromegaly or nodule noted. Cor: PMI nondisplaced. RRR, No M/G/R noted. + Impella hum.  Lungs: Diminished basilar sounds Abdomen: Soft, non-tender, non-distended, no HSM. No bruits or masses. +BS  Extremities: No cyanosis, clubbing, or rash. Trace ankle edema. Warm to the touch. R groin impella site with oozing. Dressing changed overnight.  Neuro:  Intubated/sedated   Telemetry   NSR 60-70s, personally reviewed.   EKG    No new tracings.    Labs    CBC Recent Labs    07/22/17 0422  07/22/17 1802 07/23/17 0228  WBC 19.3*  --   --  15.7*  NEUTROABS 16.5*  --   --  13.2*  HGB 9.2*   < > 7.8* 7.9*  HCT 27.5*  --   --  23.4*  MCV 83.6  --   --  83.6  PLT 170  --   --  134*   < > = values in this interval not displayed.   Basic Metabolic Panel Recent Labs    07/22/17 0422 07/22/17 1802 07/23/17 0228  NA 131*  --  131*  K 4.0 3.3* 3.1*  CL 100*  --  95*  CO2 24  --  26  GLUCOSE 158*  --  152*  BUN 16  --  22*  CREATININE 0.99  --  1.05*  CALCIUM 7.8*  --  7.4*  MG 2.0 1.8 1.6*  PHOS 2.8 1.9* 1.5*   Liver Function Tests Recent Labs    07/22/17 0422 07/23/17 0228  AST 93* 80*  ALT 117* 99*  ALKPHOS 86 84  BILITOT 2.5* 3.1*  PROT 4.7* 4.5*  ALBUMIN 2.2* 1.9*   No results for input(s): LIPASE, AMYLASE in the last  72 hours. Cardiac Enzymes Recent Labs    07/02/2017 1358 07/05/2017 2043  TROPONINI 27.12* 36.12*    BNP: BNP (last 3 results) No results for input(s): BNP in the last 8760 hours.  ProBNP (last 3 results) No results for input(s): PROBNP in the last 8760 hours.   D-Dimer No results for input(s): DDIMER in the last 72 hours. Hemoglobin A1C No results for input(s): HGBA1C in the last 72 hours. Fasting Lipid Panel No results for input(s): CHOL, HDL, LDLCALC, TRIG, CHOLHDL, LDLDIRECT in the last 72 hours. Thyroid Function Tests No results for input(s): TSH, T4TOTAL, T3FREE, THYROIDAB in the last 72 hours.  Invalid input(s): FREET3  Other results:   Imaging    No results found.   Medications:     Scheduled Medications: . aspirin  81 mg Per Tube Daily  . atorvastatin  80 mg Per Tube Daily  . chlorhexidine gluconate (MEDLINE KIT)  15 mL Mouth Rinse BID  . chlorhexidine gluconate (MEDLINE KIT)  15 mL Mouth Rinse BID  . Chlorhexidine Gluconate Cloth  6 each Topical Daily  .  digoxin  0.125 mg Intravenous Daily  . docusate  100 mg Per Tube BID  . feeding supplement (PRO-STAT SUGAR FREE 64)  30 mL Per Tube BID  . feeding supplement (VITAL HIGH PROTEIN)  1,000 mL Per Tube Q24H  . fentaNYL (SUBLIMAZE) injection  50 mcg Intravenous Once  . ipratropium  0.5 mg Nebulization Q6H  . mouth rinse  15 mL Mouth Rinse QID  . sodium chloride flush  10-40 mL Intracatheter Q12H  . sodium chloride flush  3 mL Intravenous Q12H  . sodium chloride flush  3 mL Intravenous Q12H  . ticagrelor  90 mg Per Tube BID    Infusions: . sodium chloride 10 mL/hr at 07/23/17 0700  . sodium chloride 10 mL/hr at 07/23/17 0700  . sodium chloride    . amiodarone 60 mg/hr (07/23/17 0700)  . famotidine (PEPCID) IV    . fentaNYL infusion INTRAVENOUS 200 mcg/hr (07/23/17 0700)  . furosemide (LASIX) infusion 8 mg/hr (07/23/17 0700)  . impella catheter heparin 50 unit/mL in dextrose 5%    . insulin (NOVOLIN-R) infusion 6.3 mL/hr at 07/23/17 0700  . midazolam (VERSED) infusion Stopped (07/23/17 0320)  . milrinone 0.125 mcg/kg/min (07/23/17 0700)  . norepinephrine (LEVOPHED) Adult infusion 10.027 mcg/min (07/23/17 0700)  . piperacillin-tazobactam (ZOSYN)  IV Stopped (07/23/17 0618)  . vancomycin Stopped (07/22/17 1107)  . vasopressin (PITRESSIN) infusion - *FOR SHOCK* 0.03 Units/min (07/23/17 0700)    PRN Medications: sodium chloride, acetaminophen (TYLENOL) oral liquid 160 mg/5 mL **OR** acetaminophen, bisacodyl, fentaNYL, levalbuterol, midazolam, ondansetron (ZOFRAN) IV, sodium chloride flush, sodium chloride flush, sodium chloride flush    Patient Profile   [name] is a 84 y.o. female with h/o CAD s/p PCI to RCA and LAD 04/2013, PAF, On Eliquis, Diabetes, and HTN  Presented to [facility] 07/08/2017 with near-syncope, emesis, and CP. Emergently transferred to [facility] with EKG with anterolateral ST elevations. Taken emergently for Cath.   Assessment/Plan   1. Cardiogenic shock in setting of  Acute MI - Output improved with Impella implant am of 07/19/2017. - Coox 71.6% on milrinone 0.125, NE 9, and Vasopresisn 0.03. - Volume status remains elevated. Good UOP, but ++ drips. CVP 11-12.  - Continue to attempt diuresis with lasix 40 mg TID. - Impella remains at P6 with 2.6-2.7 of flow. Good waveform.  - Not candidate for up-titration to impella 5.0 given age, multiple comorbidiites, and bleeding  risk.   2. CAD s/p PCA with LAD DES - Relatively stable with mechanical support.  - Continue ASA and statin.  - No change to current plan.    3. Acute hypoxic respiratory failure - Remains intubated and sedated. Intermittently awakens.  - FiO2 40%. 5 PEEP.   4. RLE ischemia - s/p emergent ex-vivo SFA bypass from Impella 07/02/2017 - Stable.   5. VF Arrest 07/15/2017 - Repeat LHC 07/24/2017 with no acute stent thrombosis.  - Quiescent.  - Keep K > 4.0 and Mg > 2.0. Supp ordered.   6. Paroxysmal Afib with RVR - Attempt DCCV x 3 07/22/2017, each time with return to Afib.  - Converted to NSR on amio.  - CHA2DS2/VAc at least 6.   7. Hypokalemia/Hypomagnesemia - goal K > 4.0 and Mg > 2.0.  - Supp this am.   8. ID - WBC slightly improved on ABX. Tmax 99.9 - BCx pending.  - Continue vanc/zosyn.   Patient is critically ill and in multiple system organ failure.  Continue to follow closely. Prognosis guarded. Set back yesterday with new sepsis, but numbers improved this am on ABX and vasopressin.   Medication concerns reviewed with patient and pharmacy team. Barriers identified: None at this time.   Length of Stay: [address]  [name]  07/23/2017, 7:22 AM  Advanced Heart Failure Team Pager [phone] (M-F; 7a - 4p)  Please contact [facility] for night-coverage after hours (4p -7a ) and weekends on [email or web address]  Agree with above.  She remains critically ill on ventilator with Impella support at P-6, NE at 9, vasopressin and milrinone 0.125. Started on broad spectrum abx  yesterday for presumed sepsis. Cx remain negative. Now back in NSR on amio 60. Diuresing well on lasix gtt. Waveforms on Impella look good. Swan numbers improved with CVP 11 and CI ~2.5  On exam Sedated but will awaken on vent +ETT Cor RRR Lungs coarse Ab obese soft NT Ext RFA impella with stable ecchymosis/hematoma. Sheath ok .Distal perfusion ok   Critically ill but making slow progress. Will turn Impella down to P-5. Wean pressors slowly. Continue to diurese. Continue broad spectrum abx and amio. Will try to wean Impella out over the weekend. To get impella out safely will perfuse RLE with ex-vivo shunt from LFA and then pull Impela followed by Mynx closure of distal perfusion sheath - possibly Sunday. D/w Dr. Einar Gip, Dr Donzetta Matters and cath lab team.   CRITICAL CARE Performed by: Glori Bickers  Total critical care time: 45 minutes  Critical care time was exclusive of separately billable procedures and treating other patients.  Critical care was necessary to treat or prevent imminent or life-threatening deterioration.  Critical care was time spent personally by me (independent of midlevel providers or residents) on the following activities: development of treatment plan with patient and/or surrogate as well as nursing, discussions with consultants, evaluation of patient's response to treatment, examination of patient, obtaining history from patient or surrogate, ordering and performing treatments and interventions, ordering and review of laboratory studies, ordering and review of radiographic studies, pulse oximetry and re-evaluation of patient's condition.  Glori Bickers, MD  10:09 AM

## 2017-07-23 NOTE — Progress Notes (Signed)
   VASCULAR SURGERY ASSESSMENT & PLAN:   Feet both look adequately perfused. Still with good doppler signal in sheath.   If any problems over the weekend, Dr. Bridgett Larsson is on call.    SUBJECTIVE:   Sedated on vent  PHYSICAL EXAM:   Vitals:   07/23/17 0313 07/23/17 0400 07/23/17 0500 07/23/17 0600  BP:      Pulse:  66 66 66  Resp:  (!) 24 (!) 24 (!) 24  Temp:  99 F (37.2 C) 99 F (37.2 C) 99.1 F (37.3 C)  TempSrc:      SpO2: 100% 94% 94% 95%  Weight:    216 lb 14.9 oz (98.4 kg)  Height:       Feet both adequately perfused.   LABS:   Lab Results  Component Value Date   WBC 15.7 (H) 07/23/2017   HGB 7.9 (L) 07/23/2017   HCT 23.4 (L) 07/23/2017   MCV 83.6 07/23/2017   PLT 134 (L) 07/23/2017   Lab Results  Component Value Date   CREATININE 1.05 (H) 07/23/2017   Lab Results  Component Value Date   INR 1.05 04/16/2013   CBG (last 3)  Recent Labs    07/23/17 0333 07/23/17 0415 07/23/17 0557  GLUCAP 136* 148* 133*    PROBLEM LIST:    Active Problems:   Acute anterior wall MI (HCC)   Acute anterolateral myocardial infarction ([place])   Cardiogenic shock (HCC)   Pulmonary edema   ST elevation myocardial infarction (STEMI) ([place])   Acute respiratory failure with hypoxia (HCC)   CURRENT MEDS:   . aspirin  81 mg Per Tube Daily  . atorvastatin  80 mg Per Tube Daily  . chlorhexidine gluconate (MEDLINE KIT)  15 mL Mouth Rinse BID  . chlorhexidine gluconate (MEDLINE KIT)  15 mL Mouth Rinse BID  . Chlorhexidine Gluconate Cloth  6 each Topical Daily  . digoxin  0.125 mg Intravenous Daily  . docusate  100 mg Per Tube BID  . feeding supplement (PRO-STAT SUGAR FREE 64)  30 mL Per Tube BID  . feeding supplement (VITAL HIGH PROTEIN)  1,000 mL Per Tube Q24H  . fentaNYL (SUBLIMAZE) injection  50 mcg Intravenous Once  . ipratropium  0.5 mg Nebulization Q6H  . mouth rinse  15 mL Mouth Rinse QID  . sodium chloride flush  10-40 mL Intracatheter Q12H  . sodium chloride  flush  3 mL Intravenous Q12H  . sodium chloride flush  3 mL Intravenous Q12H  . ticagrelor  90 mg Per Tube BID    Deitra Mayo Beeper: [phone] Office: [phone] 07/23/2017

## 2017-07-23 NOTE — Progress Notes (Signed)
ANTICOAGULATION CONSULT NOTE - [facility] for heparin Indication: Impella  Allergies  Allergen Reactions  . Sulfonamide Derivatives Other (See Comments)    REACTION: childhood unknown type, ended up in hospital    Patient Measurements: Height: _0  (165.1 cm) Weight: 216 lb 14.9 oz (98.4 kg) IBW/kg (Calculated) : 57 Heparin Dosing Weight: 75.4 kg   Vital Signs: Temp: 99 F (37.2 C) (05/24 0630) Pulse Rate: 67 (05/24 0630)  Labs: Recent Labs    07/12/2017 1358  07/03/2017 2043  07/19/2017 1115 07/10/2017 1527 07/04/2017 1816  07/22/17 0422 07/22/17 0728  07/22/17 1802 07/23/17 0228  HGB  --    < >  --    < > 9.8* 9.7* 10.1*   < > 9.2*  --    < > 7.8* 7.9*  HCT  --    < >  --    < >  --  28.7*  --   --  27.5*  --   --   --  23.4*  PLT  --    < >  --    < >  --  168  --   --  170  --   --   --  134*  APTT  --   --   --   --  87*  --   --   --  72*  --   --   --  73*  HEPARINUNFRC  --   --   --   --  0.98*  --   --   --   --  0.98*  --   --   --   CREATININE 0.89   < >  --    < >  --  0.77 0.86  --  0.99  --   --   --  1.05*  TROPONINI 27.12*  --  36.12*  --   --   --   --   --   --   --   --   --   --    < > = values in this interval not displayed.    Estimated Creatinine Clearance: 47.2 mL/min (A) (by C-G formula based on SCr of 1.05 mg/dL (H)).   Medical History: Past Medical History:  Diagnosis Date  . Allergic rhinitis due to other allergen 06/14/2008   Qualifier: Diagnosis of  By: Arnoldo Morale MD, Balinda Quails   . Cellulitis and abscess of neck 03/18/2007   Qualifier: Diagnosis of  By: Arnoldo Morale MD, Balinda Quails   . Diabetes mellitus   . Fibrocystic breast disease   . Hypertension   . Meningioma ([place])   . SEBACEOUS CYST, NECK 02/08/2007   Qualifier: Diagnosis of  By: Arnoldo Morale MD, Balinda Quails   . Tendon tear, ankle     Medications:  Scheduled:  . aspirin  81 mg Per Tube Daily  . atorvastatin  80 mg Per Tube Daily  . chlorhexidine gluconate (MEDLINE KIT)  15 mL  Mouth Rinse BID  . chlorhexidine gluconate (MEDLINE KIT)  15 mL Mouth Rinse BID  . Chlorhexidine Gluconate Cloth  6 each Topical Daily  . digoxin  0.125 mg Intravenous Daily  . docusate  100 mg Per Tube BID  . feeding supplement (PRO-STAT SUGAR FREE 64)  30 mL Per Tube BID  . feeding supplement (VITAL HIGH PROTEIN)  1,000 mL Per Tube Q24H  . fentaNYL (SUBLIMAZE) injection  50 mcg Intravenous Once  . ipratropium  0.5 mg  Nebulization Q6H  . mouth rinse  15 mL Mouth Rinse QID  . potassium chloride  40 mEq Oral Once  . sodium chloride flush  10-40 mL Intracatheter Q12H  . sodium chloride flush  3 mL Intravenous Q12H  . sodium chloride flush  3 mL Intravenous Q12H  . ticagrelor  90 mg Per Tube BID    Assessment: 53 yoF admitted with STEMI with DES to LAD now s/p VF arrest with cardiogenic shock requiring mechanical support with Impella. Pt noted to have large R groin hematoma associated with arterial bleeding with drop in Hgb, so initially heparin only in purge with low concentration (6.25 units/ml) later increased with subtherapeutic ACTs.  Heparin now in purge at full concentration (50 units/ml) running at 14 ml/hr (700 units/hr). ACTs have been at goal ~169 with purge alone. Of note, pt has had continuous rapid AFib since 5/21 pm with several failed cardioversions. aPTT therapeutic at 73 seconds with heparinized purge alone and heparin level therapeutic but still slightly elevated compared with aPTT due to recent apixaban use, so will hold off on adding systemic heparin to cover AFib at this time.  Goal of Therapy:  ACT 160-180 sec Monitor platelets by anticoagulation protocol: Yes   Plan:  -Continue heparinized purge solution -Follow ACTs per RN protocol -Daily aPTT, heparin level, CBC  Arrie Senate, PharmD, BCPS PGY-2 Cardiology Pharmacy Resident Pager: [phone] 07/23/2017

## 2017-07-23 NOTE — Progress Notes (Signed)
Subjective:  Intubated and sedated, has had excellent urine output overnight.  Objective:  Vital Signs in the last 24 hours: Temp:  [98.8 F (37.1 C)-99.9 F (37.7 C)] 99 F (37.2 C) (05/24 0630) Pulse Rate:  [65-131] 65 (05/24 0743) Resp:  [13-24] 24 (05/24 0743) BP: (139)/(62) 139/62 (05/24 0743) SpO2:  [94 %-100 %] 95 % (05/24 0743) FiO2 (%):  [40 %-50 %] 40 % (05/24 0743) Weight:  [98.4 kg (216 lb 14.9 oz)] 98.4 kg (216 lb 14.9 oz) (05/24 0600)  Intake/Output from previous day: 05/23 0701 - 05/24 0700 In: 4528.1 [I.V.:2961.6; NG/GT:710; IV Piggyback:500] Out: 4310 [Urine:4310]  Physical Exam:  General appearance: sedated and intubated Eyes: negative findings: lids and lashes normal and mild puffiness of eyelids Neck: Cannot determine JVD due to intubation and lines Neck: JVP - normal, carotids 2+= without bruits Resp: clear to auscultation bilaterally  Cardio: regular rate and rhythm, S1, S2 normal, no murmur, click, rub or gallop GI: Soft, obese.  Bowel sounds are heard. Extremities: Bilateral lower extremities are warm.  2+ edema noted.    Lab Results: BMP Recent Labs    07/30/2017 1816 07/22/17 0422 07/22/17 1802 07/23/17 0228  NA 131* 131*  --  131*  K 4.3 4.0 3.3* 3.1*  CL 102 100*  --  95*  CO2 22 24  --  26  GLUCOSE 182* 158*  --  152*  BUN 14 16  --  22*  CREATININE 0.86 0.99  --  1.05*  CALCIUM 7.9* 7.8*  --  7.4*  GFRNONAA >60 51*  --  48*  GFRAA >60 59*  --  55*    CBC Recent Labs  Lab 07/23/17 0228  WBC 15.7*  RBC 2.80*  HGB 7.9*  HCT 23.4*  PLT 134*  MCV 83.6  MCH 28.2  MCHC 33.8  RDW 14.3  LYMPHSABS 0.8  MONOABS 1.2*  EOSABS 0.0  BASOSABS 0.0    HEMOGLOBIN A1C Lab Results  Component Value Date   HGBA1C 5.9 (H) 07/09/2017   MPG 122.63 07/16/2017    Cardiac Panel (last 3 results) Recent Labs    07/24/2017 0600 07/04/2017 1358 07/10/2017 2043  TROPONINI 25.70* 27.12* 36.12*    BNP (last 3 results) No results for  input(s): PROBNP in the last 8760 hours.  TSH Recent Labs    06/10/17 1451  TSH 1.13    Lipid Panel     Component Value Date/Time   CHOL 224 (H) 07/13/2017 0311   TRIG 95 07/06/2017 0311   HDL 40 (L) 07/01/2017 0311   CHOLHDL 5.6 07/28/2017 0311   VLDL 19 07/19/2017 0311   LDLCALC 165 (H) 06/30/2017 0311   LDLDIRECT 198.0 12/17/2015 1206     Hepatic Function Panel Recent Labs    07/14/2017 1527 07/22/17 0422 07/23/17 0228  PROT 4.3* 4.7* 4.5*  ALBUMIN 2.0* 2.2* 1.9*  AST 102* 93* 80*  ALT 121* 117* 99*  ALKPHOS 79 86 84  BILITOT 1.7* 2.5* 3.1*    Imaging: Dg Chest Port 1 View  Result Date: 07/23/2017 CLINICAL DATA:  Follow-up endotracheal tube placement EXAM: PORTABLE CHEST 1 VIEW COMPARISON:  07/22/2017 FINDINGS: Endotracheal tube, nasogastric catheter, Swan-Ganz catheter and left subclavian central line are again seen and stable. An Impella device is noted extending into the left ventricle in satisfactory position. Bilateral small pleural effusions are noted increased from the prior exam but possibly positional in nature. Mild vascular congestion is again seen. No focal confluent infiltrate is noted. IMPRESSION: Tubes and  lines as described above. Mild vascular congestion and effusions. Electronically Signed   By: Inez Catalina M.D.   On: 07/23/2017 07:59   Dg Chest Port 1 View  Result Date: 07/22/2017 CLINICAL DATA:  Evaluate endotracheal, LVAD present EXAM: PORTABLE CHEST 1 VIEW COMPARISON:  Portable chest x-ray of 07/30/2017 FINDINGS: The tip of the endotracheal tube is approximately 2.4 cm above the carina. There is little change in pulmonary vascular congestion and probable small bilateral pleural effusions. Cardiomegaly is stable. Impella LVAD device appears unchanged in position. Swan-Ganz catheter from below again projects over the right pulmonary artery and left central venous line tip overlies the mid SVC. IMPRESSION: 1. Little change in pulmonary vascular  congestion and small effusions. 2. Stable cardiomegaly with LVAD device present. 3. Endotracheal tube tip 2.4 cm above the carina. Electronically Signed   By: Ivar Drape M.D.   On: 07/22/2017 09:24    Cardiac Studies:  EKG 07/22/2017: Atrial fibrillation with RVR at rate of 125 bpm, anteroseptal infarct old.  ECHO: 07/05/2017: Left ventricle: The estimated ejection fraction was in the range   of 10% to 15%.  Assessment/Plan:  1.  Cardiogenic shock 2. Acute STEMI anterolateral wall S/P stenting of proximal LAD with 3.5 x 20 mm Synergy. History of non-STEMI and PTCA with overlapping stents on 04/18/2013 with implantation of a 3.0 x 15 mm and 3.0 x 38 mm Xience DES in the proximal and mid LAD and staged intervention to the right coronary artery with implantation of a 3.0 x 38 mm x 2 DES. 3.  Paroxysmal A. fib, low back in A. fib with RVR. CHA2DS2-VASCScore: Risk Score8. 4.  Blood loss anemia  Recommendation: Her hemodynamics have improved significantly.  Right groin site hematoma has remained stable, hemoglobin has been trending down, may need blood transfusion in view of cardiogenic shock.  CVP and wedge pressures are normalized, she is presently on Neo-Synephrine 10 mcg and vasopressin at 0.03 along with milrinone at 0.125 mics.  We will try to wean her from Neo-Synephrine and eventually vasopressin today if her pressures hold up, will start gradual weaning of Impella with the hope of extubation and removal of the Impella device in the next 24 to 48 hours.  Urine output has been excellent between 150 to 175 cc an hour overnight.  Will replace potassium and magnesium.  Lower extremity is warm without evidence of acute arterial insufficiency.  Appreciate advanced heart failure team continue to follow.  Adrian Prows, M.D. 07/23/2017, 8:13 AM [facility] Pager: [phone] Office: [phone] If no answer: [phone]

## 2017-07-23 NOTE — Progress Notes (Signed)
PULMONARY / CRITICAL CARE MEDICINE   Name: LARUE DRAWDY MRN: [id number] DOB: August 01, 1931    ADMISSION DATE:  07/04/2017 CONSULTATION DATE:  07/05/2017  REFERRING MD:  Dr. Einar Gip  CHIEF COMPLAINT:  Resp failure  HISTORY OF PRESENT ILLNESS:   HPI obtained from medical chart review as patient is obtunded and unable to provide.  84 year old female with PMH of noncompliance, CAD with prior MI in 2015, PAF with questionable compliance with Eliquis, HTN, DM and obesity admitted to Cardiology on 5/18 after presenting near syncope with chest pain.  Found to have anterior STEMI and taken to cath lab where she was found to have occluded LAD which was stented.    On 5/19, she had episode of VF and subsequent episodes of VT requiring multiple shocks.  Was alert and oriented post ROSC.  She was taken emergently back to cath lab on 5/19 but no culprit found.  She was going to be medically managed.  Additionally she developed afib with RVR from her baseline bradycardia requiring amiodarone gtt, metoprolol, and subsequently Cardizem for rate control.   She was doing well until the evening of 5/20 when she became diaphoretic and complained of shortness of breath.  She was placed on NRB and given lasix and then required BiPAP.  However she became obtunded and PCCM consulted for further management.   On preparation for intubation, patient developed vtach with pulse requiring cardioversion and antiarrhythmics.  She subsquently became hypotensive requiring vasopressor support, emergency intubation and central line placement.  Cardiology to bedside and preparing patient to return to cath lab for possible IABP vs impella.     SUBJECTIVE:  Still on high-dose pressors but weaning  VITAL SIGNS: Blood Pressure 139/62   Pulse 62   Temperature 98.8 F (37.1 C)   Respiration 17   Height 5\' 5"  (1.651 m)   Weight 216 lb 14.9 oz (98.4 kg)   Oxygen Saturation 96%   Body Mass Index 36.10 kg/m    HEMODYNAMICS: PAP: (35-49)/(13-23) 49/19 CVP:  [7 mmHg-17 mmHg] 13 mmHg PCWP:  [12 mmHg-16 mmHg] 14 mmHg CO:  [4.5 L/min-5.5 L/min] 5.5 L/min CI:  [2.3 L/min/m2-2.9 L/min/m2] 2.9 L/min/m2  VENTILATOR SETTINGS: Vent Mode: Other (Comment) FiO2 (%):  [40 %] 40 % Set Rate:  [18 bmp-24 bmp] 18 bmp Vt Set:  [450 mL] 450 mL PEEP:  [5 cmH20] 5 cmH20 Plateau Pressure:  [10 cmH20-22 cmH20] 10 cmH20  INTAKE / OUTPUT:  Intake/Output Summary (Last 24 hours) at 07/23/2017 1152 Last data filed at 07/23/2017 1100 Gross per 24 hour  Intake 4433.06 ml  Output 5405 ml  Net -971.94 ml     PHYSICAL EXAMINATION: General: 84 year old female remains critically ill on multiple pressors and Impella pump   HEENT sclera slightly icteric with scleral edema orally intubated mucous membranes moist Pulmonary: Scattered rhonchi equal chest rise with mechanical ventilation Cardiac: Regular irregular Abdomen: Soft, not apparently tender hypoactive bowel sounds Extremity: Diffuse anasarca.  The right lower extremity is cool somewhat warmer compared to prior exam Neuro: Opens eyes, follows commands, not agitated today GU: Clear yellow LABS:  BMET Recent Labs  Lab 07/13/2017 1816 07/22/17 0422 07/22/17 1802 07/23/17 0228 07/23/17 0808  NA 131* 131*  --  131* 132*  K 4.3 4.0 3.3* 3.1* 3.0*  CL 102 100*  --  95*  --   CO2 22 24  --  26  --   BUN 14 16  --  22*  --   CREATININE 0.86  0.99  --  1.05*  --   GLUCOSE 182* 158*  --  152* 178*    Electrolytes Recent Labs  Lab 07/26/2017 1816 07/22/17 0422 07/22/17 1802 07/23/17 0228  CALCIUM 7.9* 7.8*  --  7.4*  MG 2.2 2.0 1.8 1.6*  PHOS 3.1 2.8 1.9* 1.5*    CBC Recent Labs  Lab 07/02/2017 1527  07/22/17 0422  07/23/17 0228 07/23/17 0753 07/23/17 0808  WBC 15.4*  --  19.3*  --  15.7*  --   --   HGB 9.7*   < > 9.2*   < > 7.9* 7.9* 6.8*  HCT 28.7*  --  27.5*  --  23.4*  --  20.0*  PLT 168  --  170  --  134*  --   --    < > = values in this  interval not displayed.    Coag's Recent Labs  Lab 07/10/2017 1115 07/22/17 0422 07/23/17 0228  APTT 87* 72* 73*    Sepsis Markers Recent Labs  Lab 07/07/2017 0215  07/28/2017 2043 07/16/2017 0006 07/01/2017 0306  LATICACIDVEN  --    < > 3.7* 2.7* 2.6*  PROCALCITON 0.27  --   --   --   --    < > = values in this interval not displayed.    ABG Recent Labs  Lab 07/22/17 0538 07/22/17 0957 07/23/17 0238  PHART 7.297* 7.384 7.492*  PCO2ART 46.4 37.1 33.8  PO2ART 171.0* 81.0* 111.0*    Liver Enzymes Recent Labs  Lab 07/08/2017 1527 07/22/17 0422 07/23/17 0228  AST 102* 93* 80*  ALT 121* 117* 99*  ALKPHOS 79 86 84  BILITOT 1.7* 2.5* 3.1*  ALBUMIN 2.0* 2.2* 1.9*    Cardiac Enzymes Recent Labs  Lab 07/22/2017 0600 07/16/2017 1358 07/19/2017 2043  TROPONINI 25.70* 27.12* 36.12*    Glucose Recent Labs  Lab 07/23/17 0557 07/23/17 0654 07/23/17 0806 07/23/17 0905 07/23/17 1001 07/23/17 1056  GLUCAP 133* 171* 178* 165* 170* 176*    Imaging Dg Chest Port 1 View  Result Date: 07/23/2017 CLINICAL DATA:  Follow-up endotracheal tube placement EXAM: PORTABLE CHEST 1 VIEW COMPARISON:  07/22/2017 FINDINGS: Endotracheal tube, nasogastric catheter, Swan-Ganz catheter and left subclavian central line are again seen and stable. An Impella device is noted extending into the left ventricle in satisfactory position. Bilateral small pleural effusions are noted increased from the prior exam but possibly positional in nature. Mild vascular congestion is again seen. No focal confluent infiltrate is noted. IMPRESSION: Tubes and lines as described above. Mild vascular congestion and effusions. Electronically Signed   By: Inez Catalina M.D.   On: 07/23/2017 07:59   STUDIES:  5/18 LHC >> prox LAD 100% stenosed with DES placed, LVEDP elevated at 30  5/19 LHC >> stents patent; no culprit found   5/20 TTE >> Left ventricle: The cavity size was normal. There was mild   concentric hypertrophy.  The estimated ejection fraction was in   the range of 30% to 35%. Could not assess LV diastolic function   due to atrial fibrillation. - Regional wall motion abnormality: Akinesis of the apical septal   and apical myocardium; severe hypokinesis of the mid-apical   anterior and mid anteroseptal myocardium. There is also mild   global hypokinesis. GLS severely reduced at -6.9% - Left atrium: The atrium was moderately dilated. - Tricuspid valve: Mildly dilated annulus. There was mild-moderate   regurgitation. - Pulmonary arteries: PA peak pressure: 54 mm Hg (S).  CULTURES: 5/18 MRSA  PCR >> neg Blood cultures 5/23 ANTIBIOTICS: Vancomycin 5/23 Zosyn 5/23  SIGNIFICANT EVENTS: 5/18 Admitted, anterior STEMI/ LHC/ stent 5/19 vfib/ vtach arrest, multiple shocks, LHC 5/21 respiratory distress, vtach, shock, back to cath lab  LINES/TUBES: PIV  5/21 ETT >> 5/21 L [place] CVL >> 5/21 R aline >> 5/21 foley   DISCUSSION: 70 yoF admitted 5/18 with large anterior wall STEMI s/p stent to prox LAD.  On 5/19, went into Vtach/vfib arrest with intact mental status post ROSC, taken back to cath without culprit found with subsequent afib with rvr.   Developed respiratory distress, shock, and recurrent arrhythmias requiring cardioversion during the night 5/20, requiring intubation and vasopressor support.  A little more hemodynamically stable after Impella repositioning.  Weaning pressors now.  Has sustained a mild acute kidney injury but is making urine so hopefully this will recover.  Overall she is once again making progress but I am concerned that 84 years old given the severity of her illness that making a full recovery may be difficult at best.  From a pulmonary standpoint we will continue to provide full ventilator support, we decrease the minute ventilation to address iatrogenic respiratory alkalosis, will repeat a chest x-ray in a.m., I am hopeful with ongoing Lasix effusions will improve, we will hold off  from weaning until more hemodynamically stable.  ASSESSMENT / PLAN:  Acute hypoxic respiratory failure- related to cardiogenic shock/ pulmonary edema  Portable chest x-ray reviewed: Increased bilateral effusions persistent pulmonary edema aeration little worse today when comparing to yesterday's film lines and tubes in satisfactory position plan Continue full ventilator support, decrease ventilator frequency to address iatrogenic hyperventilation PAD protocol Follow-up chest x-ray in a.m. 5/24 No weaning until more hemodynamically stable  Large wall anterior STEMI 5/18 s/p stent to prox LAD Cardiogenic shock Afib w/ RVR status post cardioversion with only brief transition to sinus rhythm  recurrent Vtach - TTE 5/20, EF 30-35%, global hypokinesis, PAP 54 mm Hg -Impella initiated 5/21 -Pressor needs: Remains on levo fed and also Impella, pressor requirements have increased over the evening Plan Weaning pressors per heart failure Continue rate control per heart failure Continuing Lasix Impella pump per heart failure team Continue telemetry   right lower extremity limb ischemia status post ex vivo bypass to SFA on 5/21 with improved perfusion No evidence of compartment syndrome however remains discolored.  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 No clear evidence of active bleed hgb  Istat 6.8 reading so may be  error Plan Repeat cbc transfuse if < 7  DM -Glycemic control is acceptable Plan Continue sliding scale insulin  Acute encephalopathy - intact mental status following arrest 5/19; placed on sedation last night on 5/22 due to ventilator compliance and agitation More awake on 5/24 not agitated. Plan RASS goal -1 to -2 PAD protocol Supportive care  FAMILY  - Updates: Daughters updated daily  - Inter-disciplinary family meet or Palliative Care meeting due by:  5/28   DVT prophylaxis: heparin gtt,  per cardiology SUP H2B Diet: NPO-->start nutrition Activity: BR Disposition : ICU critically ill   Erick Colace ACNP-BC [place] Pager # [phone] OR # [phone] if no answer

## 2017-07-24 DIAGNOSIS — R578 Other shock: Secondary | ICD-10-CM

## 2017-07-24 LAB — BPAM RBC
BLOOD PRODUCT EXPIRATION DATE: 201906062359
BLOOD PRODUCT EXPIRATION DATE: 201906092359
BLOOD PRODUCT EXPIRATION DATE: 201906092359
ISSUE DATE / TIME: 201905210820
ISSUE DATE / TIME: 201905242343
UNIT TYPE AND RH: 600
Unit Type and Rh: 600
Unit Type and Rh: 600

## 2017-07-24 LAB — COMPREHENSIVE METABOLIC PANEL
ALT: 87 U/L — ABNORMAL HIGH (ref 14–54)
AST: 86 U/L — ABNORMAL HIGH (ref 15–41)
Albumin: 2 g/dL — ABNORMAL LOW (ref 3.5–5.0)
Alkaline Phosphatase: 105 U/L (ref 38–126)
Anion gap: 10 (ref 5–15)
BUN: 26 mg/dL — ABNORMAL HIGH (ref 6–20)
CALCIUM: 7.6 mg/dL — AB (ref 8.9–10.3)
CO2: 30 mmol/L (ref 22–32)
Chloride: 95 mmol/L — ABNORMAL LOW (ref 101–111)
Creatinine, Ser: 1.13 mg/dL — ABNORMAL HIGH (ref 0.44–1.00)
GFR calc non Af Amer: 44 mL/min — ABNORMAL LOW (ref >60)
GFR, EST AFRICAN AMERICAN: 51 mL/min — AB (ref >60)
Glucose, Bld: 163 mg/dL — ABNORMAL HIGH (ref 65–99)
POTASSIUM: 3.7 mmol/L (ref 3.5–5.1)
SODIUM: 135 mmol/L (ref 135–145)
Total Bilirubin: 3.3 mg/dL — ABNORMAL HIGH (ref 0.3–1.2)
Total Protein: 4.7 g/dL — ABNORMAL LOW (ref 6.5–8.1)

## 2017-07-24 LAB — GLUCOSE, CAPILLARY
GLUCOSE-CAPILLARY: 145 mg/dL — AB (ref 65–99)
GLUCOSE-CAPILLARY: 147 mg/dL — AB (ref 65–99)
GLUCOSE-CAPILLARY: 153 mg/dL — AB (ref 65–99)
GLUCOSE-CAPILLARY: 150 mg/dL — AB (ref 65–99)
GLUCOSE-CAPILLARY: 176 mg/dL — AB (ref 65–99)
GLUCOSE-CAPILLARY: 165 mg/dL — AB (ref 65–99)
Glucose-Capillary: 135 mg/dL — ABNORMAL HIGH (ref 65–99)
Glucose-Capillary: 135 mg/dL — ABNORMAL HIGH (ref 65–99)
Glucose-Capillary: 142 mg/dL — ABNORMAL HIGH (ref 65–99)
Glucose-Capillary: 143 mg/dL — ABNORMAL HIGH (ref 65–99)
Glucose-Capillary: 145 mg/dL — ABNORMAL HIGH (ref 65–99)
Glucose-Capillary: 146 mg/dL — ABNORMAL HIGH (ref 65–99)
Glucose-Capillary: 157 mg/dL — ABNORMAL HIGH (ref 65–99)
Glucose-Capillary: 163 mg/dL — ABNORMAL HIGH (ref 65–99)
Glucose-Capillary: 180 mg/dL — ABNORMAL HIGH (ref 65–99)
Glucose-Capillary: 181 mg/dL — ABNORMAL HIGH (ref 65–99)
Glucose-Capillary: 212 mg/dL — ABNORMAL HIGH (ref 65–99)
Glucose-Capillary: 228 mg/dL — ABNORMAL HIGH (ref 65–99)
Glucose-Capillary: 235 mg/dL — ABNORMAL HIGH (ref 65–99)

## 2017-07-24 LAB — CBC WITH DIFFERENTIAL/PLATELET
BASOS ABS: 0 10*3/uL (ref 0.0–0.1)
BASOS PCT: 0 %
Eosinophils Absolute: 0.3 10*3/uL (ref 0.0–0.7)
Eosinophils Relative: 2 %
HEMATOCRIT: 27.1 % — AB (ref 36.0–46.0)
HEMOGLOBIN: 8.9 g/dL — AB (ref 12.0–15.0)
LYMPHS PCT: 5 %
Lymphs Abs: 0.6 10*3/uL — ABNORMAL LOW (ref 0.7–4.0)
MCH: 27.9 pg (ref 26.0–34.0)
MCHC: 32.8 g/dL (ref 30.0–36.0)
MCV: 85 fL (ref 78.0–100.0)
MONOS PCT: 10 %
Monocytes Absolute: 1.3 10*3/uL — ABNORMAL HIGH (ref 0.1–1.0)
NEUTROS ABS: 10.6 10*3/uL — AB (ref 1.7–7.7)
Neutrophils Relative %: 83 %
Platelets: 113 10*3/uL — ABNORMAL LOW (ref 150–400)
RBC: 3.19 MIL/uL — ABNORMAL LOW (ref 3.87–5.11)
RDW: 14.9 % (ref 11.5–15.5)
WBC: 12.8 10*3/uL — ABNORMAL HIGH (ref 4.0–10.5)

## 2017-07-24 LAB — TYPE AND SCREEN
ABO/RH(D): A NEG
Antibody Screen: NEGATIVE
UNIT DIVISION: 0
UNIT DIVISION: 0
UNIT DIVISION: 0

## 2017-07-24 LAB — CBC
HEMATOCRIT: 26.4 % — AB (ref 36.0–46.0)
HEMATOCRIT: 29.3 % — AB (ref 36.0–46.0)
HEMOGLOBIN: 8.7 g/dL — AB (ref 12.0–15.0)
Hemoglobin: 9.9 g/dL — ABNORMAL LOW (ref 12.0–15.0)
MCH: 28.6 pg (ref 26.0–34.0)
MCH: 28.2 pg (ref 26.0–34.0)
MCHC: 33 g/dL (ref 30.0–36.0)
MCHC: 33.8 g/dL (ref 30.0–36.0)
MCV: 86.8 fL (ref 78.0–100.0)
MCV: 83.5 fL (ref 78.0–100.0)
Platelets: 77 10*3/uL — ABNORMAL LOW (ref 150–400)
Platelets: 94 10*3/uL — ABNORMAL LOW (ref 150–400)
RBC: 3.04 MIL/uL — ABNORMAL LOW (ref 3.87–5.11)
RBC: 3.51 MIL/uL — ABNORMAL LOW (ref 3.87–5.11)
RDW: 14.6 % (ref 11.5–15.5)
RDW: 14.6 % (ref 11.5–15.5)
WBC: 13.8 10*3/uL — AB (ref 4.0–10.5)
WBC: 23.5 10*3/uL — ABNORMAL HIGH (ref 4.0–10.5)

## 2017-07-24 LAB — HEMOGLOBIN
HEMOGLOBIN: 8.9 g/dL — AB (ref 12.0–15.0)
Hemoglobin: 7.8 g/dL — ABNORMAL LOW (ref 12.0–15.0)

## 2017-07-24 LAB — COOXEMETRY PANEL
Carboxyhemoglobin: 1.9 % — ABNORMAL HIGH (ref 0.5–1.5)
METHEMOGLOBIN: 0.8 % (ref 0.0–1.5)
O2 SAT: 84.6 %
Total hemoglobin: 9.2 g/dL — ABNORMAL LOW (ref 12.0–16.0)

## 2017-07-24 LAB — PREPARE RBC (CROSSMATCH)

## 2017-07-24 LAB — POCT I-STAT 3, ART BLOOD GAS (G3+)
ACID-BASE EXCESS: 6 mmol/L — AB (ref 0.0–2.0)
ACID-BASE EXCESS: 9 mmol/L — AB (ref 0.0–2.0)
Acid-Base Excess: 12 mmol/L — ABNORMAL HIGH (ref 0.0–2.0)
BICARBONATE: 30.7 mmol/L — AB (ref 20.0–28.0)
Bicarbonate: 35.1 mmol/L — ABNORMAL HIGH (ref 20.0–28.0)
Bicarbonate: 33.2 mmol/L — ABNORMAL HIGH (ref 20.0–28.0)
O2 SAT: 100 %
O2 SAT: 97 %
O2 Saturation: 98 %
PCO2 ART: 41 mmHg (ref 32.0–48.0)
PH ART: 7.541 — AB (ref 7.350–7.450)
PO2 ART: 226 mmHg — AB (ref 83.0–108.0)
PO2 ART: 87 mmHg (ref 83.0–108.0)
Patient temperature: 36.8
Patient temperature: 37.8
TCO2: 32 mmol/L (ref 22–32)
TCO2: 36 mmol/L — ABNORMAL HIGH (ref 22–32)
TCO2: 35 mmol/L — AB (ref 22–32)
pCO2 arterial: 46.4 mmHg (ref 32.0–48.0)
pCO2 arterial: 46.4 mmHg (ref 32.0–48.0)
pH, Arterial: 7.432 (ref 7.350–7.450)
pH, Arterial: 7.465 — ABNORMAL HIGH (ref 7.350–7.450)
pO2, Arterial: 105 mmHg (ref 83.0–108.0)

## 2017-07-24 LAB — POCT I-STAT 4, (NA,K, GLUC, HGB,HCT)
Glucose, Bld: 141 mg/dL — ABNORMAL HIGH (ref 65–99)
Glucose, Bld: 172 mg/dL — ABNORMAL HIGH (ref 65–99)
Glucose, Bld: 198 mg/dL — ABNORMAL HIGH (ref 65–99)
HCT: 23 % — ABNORMAL LOW (ref 36.0–46.0)
HCT: 17 % — ABNORMAL LOW (ref 36.0–46.0)
HEMATOCRIT: 50 % — AB (ref 36.0–46.0)
HEMOGLOBIN: 7.8 g/dL — AB (ref 12.0–15.0)
Hemoglobin: 17 g/dL — ABNORMAL HIGH (ref 12.0–15.0)
Hemoglobin: 5.8 g/dL — CL (ref 12.0–15.0)
POTASSIUM: 3.2 mmol/L — AB (ref 3.5–5.1)
Potassium: 3.7 mmol/L (ref 3.5–5.1)
Potassium: 4.5 mmol/L (ref 3.5–5.1)
SODIUM: 134 mmol/L — AB (ref 135–145)
Sodium: 135 mmol/L (ref 135–145)
Sodium: 135 mmol/L (ref 135–145)

## 2017-07-24 LAB — BASIC METABOLIC PANEL
ANION GAP: 9 (ref 5–15)
BUN: 30 mg/dL — ABNORMAL HIGH (ref 6–20)
CHLORIDE: 94 mmol/L — AB (ref 101–111)
CO2: 30 mmol/L (ref 22–32)
CREATININE: 1.16 mg/dL — AB (ref 0.44–1.00)
Calcium: 6.8 mg/dL — ABNORMAL LOW (ref 8.9–10.3)
GFR calc non Af Amer: 42 mL/min — ABNORMAL LOW (ref >60)
GFR, EST AFRICAN AMERICAN: 49 mL/min — AB (ref >60)
Glucose, Bld: 222 mg/dL — ABNORMAL HIGH (ref 65–99)
Potassium: 3.8 mmol/L (ref 3.5–5.1)
Sodium: 133 mmol/L — ABNORMAL LOW (ref 135–145)

## 2017-07-24 LAB — APTT: aPTT: 90 seconds — ABNORMAL HIGH (ref 24–36)

## 2017-07-24 LAB — POCT ACTIVATED CLOTTING TIME
ACTIVATED CLOTTING TIME: 169 s
ACTIVATED CLOTTING TIME: 169 s
ACTIVATED CLOTTING TIME: 175 s
ACTIVATED CLOTTING TIME: 169 s
Activated Clotting Time: 169 seconds

## 2017-07-24 LAB — PHOSPHORUS: Phosphorus: 2.5 mg/dL (ref 2.5–4.6)

## 2017-07-24 LAB — DIGOXIN LEVEL: DIGOXIN LVL: 0.8 ng/mL (ref 0.8–2.0)

## 2017-07-24 LAB — MAGNESIUM: Magnesium: 2.1 mg/dL (ref 1.7–2.4)

## 2017-07-24 LAB — HEPARIN LEVEL (UNFRACTIONATED): Heparin Unfractionated: 0.63 IU/mL (ref 0.30–0.70)

## 2017-07-24 MED ORDER — POTASSIUM CHLORIDE 10 MEQ/50ML IV SOLN
10.0000 meq | INTRAVENOUS | Status: AC
  Administered 2017-07-24 (×3): 10 meq via INTRAVENOUS
  Filled 2017-07-24: qty 50

## 2017-07-24 MED ORDER — FUROSEMIDE 10 MG/ML IJ SOLN
80.0000 mg | Freq: Once | INTRAMUSCULAR | Status: AC
  Administered 2017-07-24: 80 mg via INTRAVENOUS
  Filled 2017-07-24: qty 8

## 2017-07-24 MED ORDER — VECURONIUM BROMIDE 10 MG IV SOLR
INTRAVENOUS | Status: AC
  Administered 2017-07-24: 10 mg
  Filled 2017-07-24: qty 10

## 2017-07-24 MED ORDER — NOREPINEPHRINE 16 MG/250ML-% IV SOLN
0.0000 ug/min | INTRAVENOUS | Status: DC
Start: 2017-07-24 — End: 2017-07-25
  Administered 2017-07-24: 18 ug/min via INTRAVENOUS
  Filled 2017-07-24: qty 250

## 2017-07-24 MED ORDER — AMIODARONE IV BOLUS ONLY 150 MG/100ML
150.0000 mg | Freq: Once | INTRAVENOUS | Status: AC
  Administered 2017-07-24: 150 mg via INTRAVENOUS

## 2017-07-24 MED ORDER — ALBUMIN HUMAN 5 % IV SOLN
INTRAVENOUS | Status: AC
Start: 2017-07-24 — End: 2017-07-24
  Administered 2017-07-24: 12.5 g
  Filled 2017-07-24: qty 750

## 2017-07-24 MED ORDER — EPINEPHRINE PF 1 MG/10ML IJ SOSY: 0.2000 mg | PREFILLED_SYRINGE | Freq: Once | INTRAMUSCULAR | Status: DC

## 2017-07-24 MED ORDER — EPINEPHRINE PF 1 MG/ML IJ SOLN: 1.0000 mg | Freq: Once | INTRAMUSCULAR | Status: DC

## 2017-07-24 MED ORDER — SODIUM BICARBONATE 8.4 % IV SOLN
INTRAVENOUS | Status: AC
  Administered 2017-07-24: 50 meq
  Filled 2017-07-24: qty 50

## 2017-07-24 MED ORDER — POTASSIUM CHLORIDE 20 MEQ/15ML (10%) PO SOLN
40.0000 meq | Freq: Every day | ORAL | Status: DC
  Administered 2017-07-24: 40 meq

## 2017-07-24 MED ORDER — SODIUM BICARBONATE 8.4 % IV SOLN
50.0000 meq | Freq: Once | INTRAVENOUS | Status: AC
  Administered 2017-07-24: 50 meq via INTRAVENOUS

## 2017-07-24 MED ORDER — "THROMBI-PAD 3""X3"" EX PADS"
1.0000 | MEDICATED_PAD | Freq: Once | CUTANEOUS | Status: AC
  Administered 2017-07-24: 1 via TOPICAL
  Filled 2017-07-24 (×2): qty 1

## 2017-07-24 MED ORDER — EPINEPHRINE PF 1 MG/10ML IJ SOSY
PREFILLED_SYRINGE | INTRAMUSCULAR | Status: AC
  Administered 2017-07-24: 1 mg
  Filled 2017-07-24: qty 10

## 2017-07-24 NOTE — Progress Notes (Signed)
   Daily Progress Note   Called by nursing staff to see this patient.  On my way in to evaluate the patient, Dr. Einar Gip called to tell me to stand down.  We discussed briefly that Dr. Einar Gip believes any surgical intervention is at high risk for poor outcome, especially with recent events.   Available as needed.  Adele Barthel, MD, FACS [facility] Office: [phone] Pager: [phone]  07/24/2017, 8:33 PM

## 2017-07-24 NOTE — Progress Notes (Signed)
Pt noted to be in afib 128 on bedside ekg.  Cardiology fellow paged.  No orders received at this time.  No change in flow rate on the impella.  Flow remains 2.4.  Per Cardiology Fellow, if pt maintains afib for 1 hr without drop in BP, may bolus amiodarone 150mg  x1.  Will continue to monitor closely.

## 2017-07-24 NOTE — Progress Notes (Signed)
Fentanyl 20 ml wasted in sink.  Witnessed by Anselm Pancoast RN.

## 2017-07-24 NOTE — Progress Notes (Signed)
RT responded to a rapid response call. MD and RNs at bedside. Pt removed from vent and manually ventilated with Ambu bag.  Pt placed back on full vent support at 1519.

## 2017-07-24 NOTE — Progress Notes (Signed)
Versed 10ml wasted in sink due to expired bag.  Witnessed by [employer].

## 2017-07-24 NOTE — Progress Notes (Signed)
PULMONARY / CRITICAL CARE MEDICINE   Name: [name] MRN: [id number] DOB: 1931/05/28    ADMISSION DATE:  07/10/2017 CONSULTATION DATE:  07/15/2017  REFERRING MD:  Dr. Einar Gip  CHIEF COMPLAINT:  Resp failure  HISTORY OF PRESENT ILLNESS:   HPI obtained from medical chart review as patient is obtunded and unable to provide.  84 year old female with PMH of noncompliance, CAD with prior MI in 2015, PAF with questionable compliance with Eliquis, HTN, DM and obesity admitted to Cardiology on 5/18 after presenting near syncope with chest pain.  Found to have anterior STEMI and taken to cath lab where she was found to have occluded LAD which was stented.    On 5/19, she had episode of VF and subsequent episodes of VT requiring multiple shocks.  Was alert and oriented post ROSC.  She was taken emergently back to cath lab on 5/19 but no culprit found.  She was going to be medically managed.  Additionally she developed afib with RVR from her baseline bradycardia requiring amiodarone gtt, metoprolol, and subsequently Cardizem for rate control.   She was doing well until the evening of 5/20 when she became diaphoretic and complained of shortness of breath.  She was placed on NRB and given lasix and then required BiPAP.  However she became obtunded and PCCM consulted for further management.   On preparation for intubation, patient developed vtach with pulse requiring cardioversion and antiarrhythmics.  She subsquently became hypotensive requiring vasopressor support, emergency intubation and central line placement.  Cardiology to bedside and preparing patient to return to cath lab for possible IABP vs impella.     SUBJECTIVE:  Pressors weaned and low dose at present CVP of 12 PCWP of 14 For Impella DC 5/25 per Dr.Bensimohn T max 99.7  VITAL SIGNS: BP (!) 110/56   Pulse 86   Temp 99.3 F (37.4 C)   Resp 18   Ht 5\' 5"  (1.651 m)   Wt 220 lb 7.4 oz (100 kg)   SpO2 100%   BMI 36.69 kg/m    HEMODYNAMICS: PAP: (40-57)/(17-28) 46/20 CVP:  [9 mmHg-15 mmHg] 12 mmHg PCWP:  [17 mmHg-21 mmHg] 21 mmHg CO:  [5.8 L/min-7.2 L/min] 5.8 L/min CI:  [3 L/min/m2-3.8 L/min/m2] 3 L/min/m2  VENTILATOR SETTINGS: Vent Mode: Other (Comment) FiO2 (%):  [40 %] 40 % Set Rate:  [18 bmp] 18 bmp Vt Set:  [450 mL] 450 mL PEEP:  [5 cmH20] 5 cmH20 Plateau Pressure:  [16 cmH20-23 cmH20] 20 cmH20  INTAKE / OUTPUT:  Intake/Output Summary (Last 24 hours) at 07/24/2017 1204 Last data filed at 07/24/2017 1152 Gross per 24 hour  Intake 4322 ml  Output 5500 ml  Net -1178 ml     PHYSICAL EXAMINATION: General: 84 year old female  critically ill on multiple pressors ,  Impella pump , sedated and ventilated   HEENT sclera slightly icteric with scleral edema,  orally intubated,  mucous membranes moist Pulmonary: Bilateral excursion with coarse BS, dependent crackles, sedated and intubated on full vent support. Cardiac: S1, S2, RRR, No MRG. Impella hum Abdomen: Soft, not apparently tender hypoactive bowel sounds Extremity: Diffuse anasarca.  The right lower extremity is cool somewhat warmer compared to prior exam Neuro: Opens eyes, follows commands, not agitated today GU: Clear yellow LABS:  BMET Recent Labs  Lab 07/22/17 0422  07/23/17 0228  07/24/17 0033 07/24/17 0400 07/24/17 1047  NA 131*  --  131*   < > 135 135 134*  K 4.0   < >  3.1*   < > 3.7 3.7 3.2*  CL 100*  --  95*  --   --  95*  --   CO2 24  --  26  --   --  30  --   BUN 16  --  22*  --   --  26*  --   CREATININE 0.99  --  1.05*  --   --  1.13*  --   GLUCOSE 158*  --  152*   < > 141* 163* 172*   < > = values in this interval not displayed.    Electrolytes Recent Labs  Lab 07/22/17 0422 07/22/17 1802 07/23/17 0228 07/24/17 0400  CALCIUM 7.8*  --  7.4* 7.6*  MG 2.0 1.8 1.6* 2.1  PHOS 2.8 1.9* 1.5* 2.5    CBC Recent Labs  Lab 07/23/17 0228  07/23/17 1250  07/24/17 0033 07/24/17 0400 07/24/17 0643 07/24/17 1047   WBC 15.7*  --  14.9*  --   --  12.8*  --   --   HGB 7.9*   < > 7.7*   < > 17.0* 8.9* 8.9* 7.8*  HCT 23.4*   < > 22.7*  --  50.0* 27.1*  --  23.0*  PLT 134*  --  123*  --   --  113*  --   --    < > = values in this interval not displayed.    Coag's Recent Labs  Lab 07/22/17 0422 07/23/17 0228 07/24/17 0400  APTT 72* 73* 90*    Sepsis Markers Recent Labs  Lab 07/07/2017 0215  07/10/2017 2043 07/04/2017 0006 07/24/2017 0306  LATICACIDVEN  --    < > 3.7* 2.7* 2.6*  PROCALCITON 0.27  --   --   --   --    < > = values in this interval not displayed.    ABG Recent Labs  Lab 07/23/17 0238 07/23/17 1147 07/24/17 0353  PHART 7.492* 7.415 7.432  PCO2ART 33.8 42.5 46.4  PO2ART 111.0* 92.0 105.0    Liver Enzymes Recent Labs  Lab 07/22/17 0422 07/23/17 0228 07/24/17 0400  AST 93* 80* 86*  ALT 117* 99* 87*  ALKPHOS 86 84 105  BILITOT 2.5* 3.1* 3.3*  ALBUMIN 2.2* 1.9* 2.0*    Cardiac Enzymes Recent Labs  Lab 07/23/2017 0600 07/13/2017 1358 07/08/2017 2043  TROPONINI 25.70* 27.12* 36.12*    Glucose Recent Labs  Lab 07/24/17 0459 07/24/17 0600 07/24/17 0659 07/24/17 0805 07/24/17 0904 07/24/17 1001  GLUCAP 157* 143* 150* 135* 135* 142*    Imaging No results found. STUDIES:  5/18 LHC >> prox LAD 100% stenosed with DES placed, LVEDP elevated at 30  5/19 LHC >> stents patent; no culprit found   5/20 TTE >> Left ventricle: The cavity size was normal. There was mild   concentric hypertrophy. The estimated ejection fraction was in   the range of 30% to 35%. Could not assess LV diastolic function   due to atrial fibrillation. - Regional wall motion abnormality: Akinesis of the apical septal   and apical myocardium; severe hypokinesis of the mid-apical   anterior and mid anteroseptal myocardium. There is also mild   global hypokinesis. GLS severely reduced at -6.9% - Left atrium: The atrium was moderately dilated. - Tricuspid valve: Mildly dilated annulus. There  was mild-moderate   regurgitation. - Pulmonary arteries: PA peak pressure: 54 mm Hg (S).  CULTURES: 5/18 MRSA PCR >> neg Blood cultures 5/23 ANTIBIOTICS: Vancomycin 5/23 Zosyn 5/23  SIGNIFICANT EVENTS: 5/18 Admitted, anterior STEMI/ LHC/ stent 5/19 vfib/ vtach arrest, multiple shocks, LHC 5/21 respiratory distress, vtach, shock, back to cath lab 5/21>> Impella insertion>> 5/25  LINES/TUBES: PIV  5/21 ETT >> 5/21 L [place] CVL >> 5/21 R aline >> 5/21 foley   DISCUSSION: 66 yoF admitted 5/18 with large anterior wall STEMI s/p stent to prox LAD.  On 5/19, went into Vtach/vfib arrest with intact mental status post ROSC, taken back to cath without culprit found with subsequent afib with rvr.   Developed respiratory distress, shock, and recurrent arrhythmias requiring cardioversion during the night 5/20, requiring intubation and vasopressor support.  A little more hemodynamically stable after Impella repositioning.  Weaning pressors now.  Has sustained a mild acute kidney injury but is making urine so hopefully this will recover.  Overall she is once again making progress but I am concerned that 84 years old given the severity of her illness that making a full recovery may be difficult at best.  From a pulmonary standpoint we will continue to provide full ventilator support, we decrease the minute ventilation to address iatrogenic respiratory alkalosis, will repeat a chest x-ray in a.m., we are hopeful that with ongoing Lasix effusions will improve, we will hold off from weaning until more hemodynamically stable.  ASSESSMENT / PLAN:  Acute hypoxic respiratory failure- related to cardiogenic shock/ pulmonary edema  Portable chest x-ray reviewed: Increased bilateral effusions persistent pulmonary edema aeration little worse today when comparing to yesterday's film lines and tubes in satisfactory position plan Continue full ventilator support, decrease ventilator frequency to address iatrogenic  hyperventilation PAD protocol Follow-up chest x-ray in a.m. 5/26 Lasix gtt per cards ABG prn No weaning until more hemodynamically stable  Large wall anterior STEMI 5/18 s/p stent to prox LAD Cardiogenic shock Afib w/ RVR status post cardioversion with only brief transition to sinus rhythm  recurrent Vtach - TTE 5/20, EF 30-35%, global hypokinesis, PAP 54 mm Hg -Impella initiated 5/21, will  Discontinue 5/25 -Pressor needs: Remains on levo fed and also Impella, pressor requirements have decreased over the last 24 hours, CVP is 12, PCWP is 14 Plan Weaning pressors per heart failure Continue rate control per heart failure Continuing Lasix drip per HF team Impella pump per heart failure team>> for removal 5/25 per Dr. Haroldine Laws as she has reached optimal benefit, with improving hemodynamics, Plan for removal of Impella :   perfuse RLE with ex-vivo shunt from LFA and then pull Impela followed by Mynx closure of distal perfusion sheath per Dr. Jeffie Pollock For Luiz Blare removal per HF team 5/25 C0-ox prn Continue telemetry Appreciate cards assist  right lower extremity limb ischemia status post ex vivo bypass to SFA on 5/21 with improved perfusion No evidence of compartment syndrome however remains discolored.  A little warmer today Plan Monitor pulses and refill Deferring primary anticoagulation to his surgical and primary team services See above for plan to remove Impella/ improve perfussion  Of  RLE  Mild AKA secondary to shock state Plan Wean arterial pressure goal greater than 65 Renal dose adjusted  medications Intake output Trend CMET/ BMET daily Monitor urine output She is not a good candidate for dialysis should she worsen  Fluid and electrolyte imbalance:  Hypokalemia & hypomagnesemia  Plan Replace prn ( Potassium repletion 5/25) Trend BMET daily and prn   Leukocytosis-afebrile but white blood cells climbing, meet SIRS criteria, rule out sepsis Plan Follow-up blood  cultures>> pending 5/25 Day #2 vancomycin and Zosyn for nosocomial coverage and clinical  decline no clear source Trend fever/ WBC/ CXR  Anemia of critical illness.  No clear evidence of active bleed  HGB 8.9 on 5/25 Plan Trend  cbc transfuse if < 7 Monitor for any obvious source of bleed   GI Tolerating TF Plan: SUP Continue TF  DM -Glycemic control is acceptable Plan Continue sliding scale insulin  Acute encephalopathy - intact mental status following arrest 5/19; placed on sedation last night on 5/22 due to ventilator compliance and agitation Sedated 5/25 for removal of Impella  Plan RASS goal -1 to -2 PAD protocol Supportive care WUA daily when feasible  FAMILY  - Updates: Daughters updated daily  - Inter-disciplinary family meet or Palliative Care meeting due by:  5/28   DVT prophylaxis: heparin gtt, per cardiology SUP H2B Diet: NPO-->start nutrition Activity: BR Disposition : ICU critically ill   Magdalen Spatz AGACNP-BC [place] Pager  # [phone] if no answer

## 2017-07-24 NOTE — Progress Notes (Signed)
   Attempted to get left femoral artery access to build ex-vivo shunt from left femoral to right distal perfusion sheath.   Multiple attempts to cannulate left femoral artery with u/s and Doppler guidance. We were finally able to cannulate artery but unable to feed wire. Attempts then abandoned. Patient then became profoundly hypotensive with MAPs in 44s and nearly arrested. CVP dropped to 2. Felt to be actively bleeding from left femoral site though no obvious signs on exam.   Manual pressure held for greater than 1 hour. With the help of multiple RNs and RT patient emergently given several boluses of epinephrine and bicarb. Norepi turned up to 50 and vasopressin restarted. Patient started on IVF wide open and received 2u albumin. 3u of emergency-release RBCs given with rapid infuser. Patient bagged manually by RT during this time. Patient's AF rate accelerated to 170. Given IV amio bolus and eventually converted back to NSR.   After nearly 2 hours of resuscitation, patient stabilized with SBP 100-110. NE weaned back down to 10.   Follow-up CBC pending. Will follow q4 CBC. Transfuse further as needed.   Dr. Einar Gip arrived at bedside to assist as well. Family updated.   2 hours of additional CCT.   Glori Bickers, MD  4:08 PM

## 2017-07-24 NOTE — Progress Notes (Signed)
Critical care note:  Seen the patient  This evening. Continues to require Impella support and vasopressor and inodilator support. Hemoglobin has improved. However, she now has absent Rt poppliteal, DP, and PT pulsed. She likely has had a acute embolic occlusion.   I personally discussed the situation with Drs. Ganji and Bensimhon. I agree with Dr. Bridgett Larsson that she is not candidate for any surgical intervention and will likely not survive any surgical intervention.  I had a long conversation with the patient's family and updated them of the current situation. Her prognosis is extremely guarded.They are going to discuss as a family and make a decision regarding her code status, leaning towards DNR. In that case, I recommend no escalation of therapy, and continue current support for now. I offered them empathy and support.   90 min of critical care time.  Nigel Mormon, MD [facility] [place] Pager: [phone] Office: [phone] If no answer Cell [phone]

## 2017-07-24 NOTE — Progress Notes (Signed)
Advanced Heart Failure Rounding Note  PCP-Cardiologist: No primary care provider on file.   Subjective:    Drips: Amio 60 mg/hr NE 3 mcg Vaso off Milrinone 0.0.125  Became hypotensive with likely early sepsis am of 07/22/17. Started on vasopressin, milrinone cut back, and started on VAnc/Zosyn. BCx sent prior to ABX. BCx NGTD   Remains intubated/sedated. On impella P-5. Drips as above. Back in AF but rates in 90s on amio gtt. On heparin through impella purge only.   Impella at P5 with 2.5 flow. Good waveforms  On lasix gtt CVP 10  Co-ox 85%   [place]wan numbers PA 40/20 CVP 10 CO 5.9 CI 3.0   Objective:   Weight Range: 100 kg (220 lb 7.4 oz) Body mass index is 36.69 kg/m.   Vital Signs:   Temp:  [98.4 F (36.9 C)-100 F (37.8 C)] 99.3 F (37.4 C) (05/25 1134) Pulse Rate:  [63-151] 86 (05/25 1146) Resp:  [10-24] 18 (05/25 1134) BP: (110-127)/(53-64) 110/56 (05/25 1146) SpO2:  [94 %-100 %] 100 % (05/25 1146) FiO2 (%):  [40 %] 40 % (05/25 1146) Weight:  [100 kg (220 lb 7.4 oz)] 100 kg (220 lb 7.4 oz) (05/25 0500) Last BM Date: 07/16/17  Weight change: Filed Weights   07/22/17 0637 07/23/17 0600 07/24/17 0500  Weight: 99.3 kg (218 lb 14.7 oz) 98.4 kg (216 lb 14.9 oz) 100 kg (220 lb 7.4 oz)    Intake/Output:   Intake/Output Summary (Last 24 hours) at 07/24/2017 1238 Last data filed at 07/24/2017 1226 Gross per 24 hour  Intake 4315.22 ml  Output 5825 ml  Net -1509.78 ml      Physical Exam    General:  Intubated/sedated  HEENT: normal ETT  Neck: supple. JVP 10 Carotids 2+ bilat; no bruits. No lymphadenopathy or thryomegaly appreciated. Cor: PMI nondisplaced. IRR + impella hum  Lungs: clear anteriorl   Abdomen: obese soft, nontender, nondistended. No hepatosplenomegaly. No bruits or masses. Good bowel sounds. Extremities: no cyanosis, clubbing, rash, RFA impella and swan. Stable soft hematoma. No active bleeding. Ex-vivo shunt intact. RLE warm pale but not  mottled  Neuro: intubated/sedated   Telemetry   AF 90s, personally reviewed.   EKG    No new tracings.    Labs    CBC Recent Labs    07/23/17 0228  07/23/17 1250  07/24/17 0400 07/24/17 0643 07/24/17 1047  WBC 15.7*  --  14.9*  --  12.8*  --   --   NEUTROABS 13.2*  --   --   --  10.6*  --   --   HGB 7.9*   < > 7.7*   < > 8.9* 8.9* 7.8*  HCT 23.4*   < > 22.7*   < > 27.1*  --  23.0*  MCV 83.6  --  83.5  --  85.0  --   --   PLT 134*  --  123*  --  113*  --   --    < > = values in this interval not displayed.   Basic Metabolic Panel Recent Labs    07/23/17 0228  07/24/17 0400 07/24/17 1047  NA 131*   < > 135 134*  K 3.1*   < > 3.7 3.2*  CL 95*  --  95*  --   CO2 26  --  30  --   GLUCOSE 152*   < > 163* 172*  BUN 22*  --  26*  --   CREATININE  1.05*  --  1.13*  --   CALCIUM 7.4*  --  7.6*  --   MG 1.6*  --  2.1  --   PHOS 1.5*  --  2.5  --    < > = values in this interval not displayed.   Liver Function Tests Recent Labs    07/23/17 0228 07/24/17 0400  AST 80* 86*  ALT 99* 87*  ALKPHOS 84 105  BILITOT 3.1* 3.3*  PROT 4.5* 4.7*  ALBUMIN 1.9* 2.0*   No results for input(s): LIPASE, AMYLASE in the last 72 hours. Cardiac Enzymes No results for input(s): CKTOTAL, CKMB, CKMBINDEX, TROPONINI in the last 72 hours.  BNP: BNP (last 3 results) No results for input(s): BNP in the last 8760 hours.  ProBNP (last 3 results) No results for input(s): PROBNP in the last 8760 hours.   D-Dimer No results for input(s): DDIMER in the last 72 hours. Hemoglobin A1C No results for input(s): HGBA1C in the last 72 hours. Fasting Lipid Panel No results for input(s): CHOL, HDL, LDLCALC, TRIG, CHOLHDL, LDLDIRECT in the last 72 hours. Thyroid Function Tests No results for input(s): TSH, T4TOTAL, T3FREE, THYROIDAB in the last 72 hours.  Invalid input(s): FREET3  Other results:   Imaging    No results found.   Medications:     Scheduled Medications: . aspirin   81 mg Per Tube Daily  . atorvastatin  80 mg Per Tube Daily  . chlorhexidine gluconate (MEDLINE KIT)  15 mL Mouth Rinse BID  . Chlorhexidine Gluconate Cloth  6 each Topical Daily  . digoxin  0.125 mg Intravenous Daily  . docusate  100 mg Per Tube BID  . feeding supplement (PRO-STAT SUGAR FREE 64)  30 mL Per Tube BID  . feeding supplement (VITAL HIGH PROTEIN)  1,000 mL Per Tube Q24H  . fentaNYL (SUBLIMAZE) injection  50 mcg Intravenous Once  . ipratropium  0.5 mg Nebulization Q6H  . mouth rinse  15 mL Mouth Rinse QID  . potassium chloride  40 mEq Per Tube Daily  . sodium chloride flush  10-40 mL Intracatheter Q12H  . sodium chloride flush  3 mL Intravenous Q12H  . sodium chloride flush  3 mL Intravenous Q12H  . spironolactone  25 mg Per Tube Daily  . ticagrelor  90 mg Per Tube BID    Infusions: . sodium chloride 10 mL/hr at 07/24/17 1100  . sodium chloride 10 mL/hr at 07/24/17 1100  . sodium chloride    . amiodarone 60 mg/hr (07/24/17 1100)  . famotidine (PEPCID) IV Stopped (07/23/17 2157)  . fentaNYL infusion INTRAVENOUS 225 mcg/hr (07/24/17 1100)  . furosemide (LASIX) infusion 12 mg/hr (07/24/17 1100)  . impella catheter heparin 50 unit/mL in dextrose 5%    . insulin (NOVOLIN-R) infusion 0.7 mL/hr at 07/24/17 1100  . midazolam (VERSED) infusion 2 mg/hr (07/24/17 1100)  . milrinone 0.125 mcg/kg/min (07/24/17 1100)  . norepinephrine (LEVOPHED) Adult infusion 2.987 mcg/min (07/24/17 1100)  . piperacillin-tazobactam (ZOSYN)  IV 3.375 g (07/24/17 0950)  . potassium chloride 10 mEq (07/24/17 1215)  . vancomycin 1,250 mg (07/24/17 1029)  . vasopressin (PITRESSIN) infusion - *FOR SHOCK* Stopped (07/23/17 1236)    PRN Medications: sodium chloride, acetaminophen (TYLENOL) oral liquid 160 mg/5 mL **OR** acetaminophen, bisacodyl, fentaNYL, levalbuterol, midazolam, ondansetron (ZOFRAN) IV, sodium chloride flush, sodium chloride flush, sodium chloride flush    Patient Profile   [name] is a 84 y.o. female with h/o CAD s/p PCI to RCA and  LAD 04/2013, PAF, On Eliquis, Diabetes, and HTN  Presented to [facility] 07/23/2017 with near-syncope, emesis, and CP. Emergently transferred to [facility] with EKG with anterolateral ST elevations. Taken emergently for Cath.   Assessment/Plan   1. Cardiogenic shock in setting of Acute MI - She remains critically ill but improved with Impella support. - Hemodynamics now optimized on Impella at P-5 and minimal vasopressors.  - We have turned Impella down to P-3 and hemodynamics stable  - Feel this is the best window to get pump out.  - Discussed removing Impella in OR with vessel closure by VVS but felt to be too high risk for OR. Will perfuse RLE with ex-vivo shunt from LFA and then pull Impela followed by Mynx closure of distal perfusion sheath. Discussed with family at length and they understand risks of cardiac decompensation, limb ischemia and death.   2. CAD s/p PCA with LAD DES - Relatively stable with mechanical support.  - Continue ASA, Brilinta and statin.  - No change to current plan.    3. Acute hypoxic respiratory failure - Remains intubated and sedated. Intermittently awakens.  - FiO2 40%. 5 PEEP.  - CCM following. I discussed with them today. Will reassess for extubation once Impella out and she is stable. Continue diuresis   4. RLE ischemia - s/p emergent ex-vivo SFA bypass from Impella 07/13/2017 - Stable.  - management as above  5. VF Arrest 06/30/2017 - Repeat LHC 07/08/2017 with no acute stent thrombosis.  - Quiescent.  - Keep K > 4.0 and Mg > 2.0. Supp ordered.   6. Paroxysmal Afib with RVR - Attempt DCCV x 3 07/26/2017, each time with return to Afib.  - Converted to NSR on amio. No back in AF but rate controlled.  - Start heparin once groin stable after Impella pull - CHA2DS2/VAc at least 6.   7. Hypokalemia/Hypomagnesemia - goal K > 4.0 and Mg > 2.0.  - Supp this am.   8. ID - Suspect sepsis on 5/23. Now improved  with abx - BCx NGTD.  - Continue vanc/zosyn.   CRITICAL CARE Performed by: Glori Bickers  Total critical care time: 60 minutes  Critical care time was exclusive of separately billable procedures and treating other patients.  Critical care was necessary to treat or prevent imminent or life-threatening deterioration.  Critical care was time spent personally by me (independent of midlevel providers or residents) on the following activities: development of treatment plan with patient and/or surrogate as well as nursing, discussions with consultants, evaluation of patient's response to treatment, examination of patient, obtaining history from patient or surrogate, ordering and performing treatments and interventions, ordering and review of laboratory studies, ordering and review of radiographic studies, pulse oximetry and re-evaluation of patient's condition.  Length of Stay: Roberta, MD  07/24/2017, 12:38 PM  Advanced Heart Failure Team Pager [phone] (M-F; 7a - 4p)  Please contact [facility] for night-coverage after hours (4p -7a ) and weekends on [email or web address]

## 2017-07-24 NOTE — Progress Notes (Signed)
ANTICOAGULATION CONSULT NOTE - [place] for heparin Indication: Impella  Allergies  Allergen Reactions  . Sulfonamide Derivatives Other (See Comments)    REACTION: childhood unknown type, ended up in hospital    Patient Measurements: Height: '5\' 5"'$  (165.1 cm) Weight: 220 lb 7.4 oz (100 kg) IBW/kg (Calculated) : 57 Heparin Dosing Weight: 75.4 kg   Vital Signs: Temp: 99.3 F (37.4 C) (05/25 1134) Temp Source: Core (05/25 0800) BP: 110/56 (05/25 1146) Pulse Rate: 86 (05/25 1146)  Labs: Recent Labs    07/22/17 0422 07/22/17 0728  07/23/17 0228 07/23/17 0753  07/23/17 1250  07/24/17 0033 07/24/17 0400 07/24/17 0643 07/24/17 0805 07/24/17 1047  HGB 9.2*  --    < > 7.9* 7.9*   < > 7.7*   < > 17.0* 8.9* 8.9*  --  7.8*  HCT 27.5*  --   --  23.4*  --    < > 22.7*  --  50.0* 27.1*  --   --  23.0*  PLT 170  --   --  134*  --   --  123*  --   --  113*  --   --   --   APTT 72*  --   --  73*  --   --   --   --   --  90*  --   --   --   HEPARINUNFRC  --  0.98*  --   --  0.66  --   --   --   --   --   --  0.63  --   CREATININE 0.99  --   --  1.05*  --   --   --   --   --  1.13*  --   --   --    < > = values in this interval not displayed.    Estimated Creatinine Clearance: 44.2 mL/min (A) (by C-G formula based on SCr of 1.13 mg/dL (H)).   Medical History: Past Medical History:  Diagnosis Date  . Allergic rhinitis due to other allergen 06/14/2008   Qualifier: Diagnosis of  By: Arnoldo Morale MD, Balinda Quails   . Cellulitis and abscess of neck 03/18/2007   Qualifier: Diagnosis of  By: Arnoldo Morale MD, Balinda Quails   . Diabetes mellitus   . Fibrocystic breast disease   . Hypertension   . Meningioma ([place])   . SEBACEOUS CYST, NECK 02/08/2007   Qualifier: Diagnosis of  By: Arnoldo Morale MD, Balinda Quails   . Tendon tear, ankle     Medications:  Scheduled:  . aspirin  81 mg Per Tube Daily  . atorvastatin  80 mg Per Tube Daily  . chlorhexidine gluconate (MEDLINE KIT)  15 mL Mouth Rinse  BID  . Chlorhexidine Gluconate Cloth  6 each Topical Daily  . digoxin  0.125 mg Intravenous Daily  . docusate  100 mg Per Tube BID  . feeding supplement (PRO-STAT SUGAR FREE 64)  30 mL Per Tube BID  . feeding supplement (VITAL HIGH PROTEIN)  1,000 mL Per Tube Q24H  . fentaNYL (SUBLIMAZE) injection  50 mcg Intravenous Once  . ipratropium  0.5 mg Nebulization Q6H  . mouth rinse  15 mL Mouth Rinse QID  . potassium chloride  40 mEq Per Tube Daily  . sodium chloride flush  10-40 mL Intracatheter Q12H  . sodium chloride flush  3 mL Intravenous Q12H  . sodium chloride flush  3 mL Intravenous Q12H  . spironolactone  25 mg Per Tube Daily  . ticagrelor  90 mg Per Tube BID    Assessment: 78 yoF admitted with STEMI with DES to LAD now s/p VF arrest with cardiogenic shock requiring mechanical support with Impella. Pt noted to have large R groin hematoma associated with arterial bleeding with drop in Hgb, so initially heparin only in purge with low concentration (6.25 units/ml) later increased with subtherapeutic ACTs.  Heparin now in purge at full concentration (50 units/ml) running at 14 ml/hr (700 units/hr). ACTs have been at goal with purge alone. Of note, pt has had intermittent rapid AFib since 5/21 pm with several failed cardioversions. Heparin level therapeutic at 0.63 with purge alone so no systemic heparin added to cover AFib. Pltc trending down as expected with Impella, Hgb low but relatively stable.  Goal of Therapy:  ACT 160-180 sec Monitor platelets by anticoagulation protocol: Yes   Plan:  -Continue heparinized purge solution -Follow ACTs per RN protocol -Daily aPTT, heparin level, CBC  Arrie Senate, PharmD, BCPS PGY-2 Cardiology Pharmacy Resident Pager: [phone] 07/24/2017

## 2017-07-25 ENCOUNTER — Inpatient Hospital Stay (HOSPITAL_COMMUNITY): Payer: Medicare HMO

## 2017-07-25 LAB — POCT I-STAT 3, ART BLOOD GAS (G3+)
ACID-BASE EXCESS: 3 mmol/L — AB (ref 0.0–2.0)
Acid-Base Excess: 11 mmol/L — ABNORMAL HIGH (ref 0.0–2.0)
BICARBONATE: 25.7 mmol/L (ref 20.0–28.0)
Bicarbonate: 35.4 mmol/L — ABNORMAL HIGH (ref 20.0–28.0)
O2 Saturation: 95 %
O2 Saturation: 100 %
PCO2 ART: 45.7 mmHg (ref 32.0–48.0)
PCO2 ART: 30.7 mmHg — AB (ref 32.0–48.0)
PH ART: 7.535 — AB (ref 7.350–7.450)
PO2 ART: 171 mmHg — AB (ref 83.0–108.0)
Patient temperature: 38.5
Patient temperature: 38.2
TCO2: 37 mmol/L — AB (ref 22–32)
TCO2: 27 mmol/L (ref 22–32)
pH, Arterial: 7.502 — ABNORMAL HIGH (ref 7.350–7.450)
pO2, Arterial: 77 mmHg — ABNORMAL LOW (ref 83.0–108.0)

## 2017-07-25 LAB — COMPREHENSIVE METABOLIC PANEL
ALK PHOS: 83 U/L (ref 38–126)
ALT: 68 U/L — ABNORMAL HIGH (ref 14–54)
ANION GAP: 9 (ref 5–15)
AST: 82 U/L — ABNORMAL HIGH (ref 15–41)
Albumin: 2.1 g/dL — ABNORMAL LOW (ref 3.5–5.0)
BILIRUBIN TOTAL: 2.6 mg/dL — AB (ref 0.3–1.2)
BUN: 32 mg/dL — ABNORMAL HIGH (ref 6–20)
CALCIUM: 7.2 mg/dL — AB (ref 8.9–10.3)
CO2: 33 mmol/L — ABNORMAL HIGH (ref 22–32)
Chloride: 93 mmol/L — ABNORMAL LOW (ref 101–111)
Creatinine, Ser: 1.22 mg/dL — ABNORMAL HIGH (ref 0.44–1.00)
GFR, EST AFRICAN AMERICAN: 46 mL/min — AB (ref >60)
GFR, EST NON AFRICAN AMERICAN: 40 mL/min — AB (ref >60)
Glucose, Bld: 171 mg/dL — ABNORMAL HIGH (ref 65–99)
POTASSIUM: 3.4 mmol/L — AB (ref 3.5–5.1)
SODIUM: 135 mmol/L (ref 135–145)
TOTAL PROTEIN: 4.3 g/dL — AB (ref 6.5–8.1)

## 2017-07-25 LAB — GLUCOSE, CAPILLARY
GLUCOSE-CAPILLARY: 158 mg/dL — AB (ref 65–99)
GLUCOSE-CAPILLARY: 162 mg/dL — AB (ref 65–99)
GLUCOSE-CAPILLARY: 163 mg/dL — AB (ref 65–99)
GLUCOSE-CAPILLARY: 166 mg/dL — AB (ref 65–99)
GLUCOSE-CAPILLARY: 167 mg/dL — AB (ref 65–99)
Glucose-Capillary: 124 mg/dL — ABNORMAL HIGH (ref 65–99)
Glucose-Capillary: 136 mg/dL — ABNORMAL HIGH (ref 65–99)

## 2017-07-25 LAB — HEMOGLOBIN: HEMOGLOBIN: 9.1 g/dL — AB (ref 12.0–15.0)

## 2017-07-25 LAB — COOXEMETRY PANEL
Carboxyhemoglobin: 2 % — ABNORMAL HIGH (ref 0.5–1.5)
Methemoglobin: 1 % (ref 0.0–1.5)
O2 Saturation: 76.8 %
Total hemoglobin: 8.7 g/dL — ABNORMAL LOW (ref 12.0–16.0)

## 2017-07-25 LAB — CBC WITH DIFFERENTIAL/PLATELET
BASOS ABS: 0.2 10*3/uL — AB (ref 0.0–0.1)
Basophils Relative: 1 %
EOS PCT: 0 %
Eosinophils Absolute: 0 10*3/uL (ref 0.0–0.7)
HEMATOCRIT: 26.1 % — AB (ref 36.0–46.0)
Hemoglobin: 8.8 g/dL — ABNORMAL LOW (ref 12.0–15.0)
LYMPHS ABS: 1.5 10*3/uL (ref 0.7–4.0)
Lymphocytes Relative: 7 %
MCH: 28.4 pg (ref 26.0–34.0)
MCHC: 33.7 g/dL (ref 30.0–36.0)
MCV: 84.2 fL (ref 78.0–100.0)
MONOS PCT: 8 %
Monocytes Absolute: 1.8 10*3/uL — ABNORMAL HIGH (ref 0.1–1.0)
Neutro Abs: 18.5 10*3/uL — ABNORMAL HIGH (ref 1.7–7.7)
Neutrophils Relative %: 84 %
Platelets: 106 10*3/uL — ABNORMAL LOW (ref 150–400)
RBC: 3.1 MIL/uL — ABNORMAL LOW (ref 3.87–5.11)
RDW: 15 % (ref 11.5–15.5)
WBC: 22 10*3/uL — ABNORMAL HIGH (ref 4.0–10.5)

## 2017-07-25 LAB — APTT: aPTT: 132 seconds — ABNORMAL HIGH (ref 24–36)

## 2017-07-25 LAB — MAGNESIUM: Magnesium: 1.9 mg/dL (ref 1.7–2.4)

## 2017-07-25 LAB — HEPARIN LEVEL (UNFRACTIONATED): HEPARIN UNFRACTIONATED: 0.65 [IU]/mL (ref 0.30–0.70)

## 2017-07-25 LAB — POCT ACTIVATED CLOTTING TIME
ACTIVATED CLOTTING TIME: 186 s
Activated Clotting Time: 169 seconds
Activated Clotting Time: 180 seconds

## 2017-07-25 LAB — PHOSPHORUS: PHOSPHORUS: 3.2 mg/dL (ref 2.5–4.6)

## 2017-07-25 MED ORDER — EPINEPHRINE PF 1 MG/ML IJ SOLN
0.5000 ug/min | INTRAVENOUS | Status: DC
  Filled 2017-07-25: qty 4

## 2017-07-25 MED FILL — Medication: Qty: 2 | Status: AC

## 2017-07-26 LAB — BPAM RBC
BLOOD PRODUCT EXPIRATION DATE: 201906092359
BLOOD PRODUCT EXPIRATION DATE: 201906162359
BLOOD PRODUCT EXPIRATION DATE: 201906172359
Blood Product Expiration Date: 201906032359
Blood Product Expiration Date: 201906142359
Blood Product Expiration Date: 201906152359
Blood Product Expiration Date: 201906172359
Blood Product Expiration Date: 201906182359
ISSUE DATE / TIME: 201905251454
ISSUE DATE / TIME: 201905251454
ISSUE DATE / TIME: 201905251454
ISSUE DATE / TIME: 201905251647
ISSUE DATE / TIME: 201905270234
ISSUE DATE / TIME: 201905270234
UNIT TYPE AND RH: 600
UNIT TYPE AND RH: 600
UNIT TYPE AND RH: 9500
UNIT TYPE AND RH: 9500
UNIT TYPE AND RH: 9500
UNIT TYPE AND RH: 9500
Unit Type and Rh: 9500
Unit Type and Rh: 9500

## 2017-07-26 LAB — GLUCOSE, CAPILLARY: Glucose-Capillary: 197 mg/dL — ABNORMAL HIGH (ref 65–99)

## 2017-07-26 LAB — TYPE AND SCREEN
ABO/RH(D): A NEG
ANTIBODY SCREEN: NEGATIVE
UNIT DIVISION: 0
UNIT DIVISION: 0
Unit division: 0
Unit division: 0
Unit division: 0
Unit division: 0
Unit division: 0
Unit division: 0

## 2017-07-27 ENCOUNTER — Ambulatory Visit: Payer: Medicare HMO | Admitting: Family Medicine

## 2017-07-27 LAB — CULTURE, BLOOD (ROUTINE X 2)
CULTURE: NO GROWTH
Culture: NO GROWTH
Special Requests: ADEQUATE
Special Requests: ADEQUATE

## 2017-07-31 NOTE — Progress Notes (Signed)
Family came to me and stated adamantly that they request pt remain FULL CODE.  The want to keep this code status until clot is/isn't confirmed to RLE.

## 2017-07-31 NOTE — Death Summary Note (Signed)
DEATH SUMMARY NOTE:  84 year old Caucasian female with atrial fibrillation, type 2 DM, dementia, CAD s/p prior PCI, was admitted to [facility] on 07/06/2017 with STEMI- large anterior wall myocardial infarction. She underwent successful primary PCI to LAD with restoration of the flow. Unfortunately, she developed post MI complications of ventricular fibrillation leading on 05/19 from which she was successfully resuscitated. She further developed cardiogenic shock and pulmonary edema on 05/20 requiring hemodynamic support with Impella CP. Further complications included bleeding at Impella placement site requiring replacement and adjustment multiple times. Subsequent hospital course was further complicated by post MI recurrent atrial fibrillation, sepsis, bleeding, ischemic right lower extremity.   Patient suffered increasing requirement of vasopressor support on August 22, 2017, followed by PEA arrest. After 7 min of ACLS resuscitation attempts without ROSC, it was evident that any further attempts were futile. Patient was pronounced dead.  Primary cause of death: Cardiogenic shock Other causes: Myocardial infarction, ventricular fibrillation, sepsis, bleeding, atrial fibrillation/  Time of death: 7:20 AM  Chaplain support offered to the  Bereaved family.  Nigel Mormon, MD [facility] [place] Pager: [phone] Office: [phone] If no answer Cell [phone]

## 2017-07-31 NOTE — Progress Notes (Signed)
   2017-07-26 0600  Clinical Encounter Type  Visited With Family  Visit Type Initial;Critical Care;Spiritual support;Psychological support  Referral From Nurse  Spiritual Encounters  Spiritual Needs Grief support;Emotional  [place] paged to support family in critical care; [place] met with family discussing the patient current situation and MD diagnosis reviewing, at family request, their options regarding care; [place] offered emotional, spiritual and spiritual support including prayer.

## 2017-07-31 NOTE — Progress Notes (Signed)
No doppler pulses popliteal and below to right foot.  Right foot is cool and mottled. Drs Haroldine Laws, Patwardhan, and Chen notified of RLE status.  Right femoral sheath flushes without resistance and flow audible via doppler.  Will continue to monitor closely.

## 2017-07-31 NOTE — Significant Event (Signed)
.. ..    Name: [name] MRN: [id number] DOB: 08-Apr-1931    ADMISSION DATE:  07/05/2017    Paged to bedside by Elink at 6:50 AM when pt became hypotensive ABG: 7.5/45/77/35 While at bedside evaluating pt, A- line lost waveform and pt had no palpable pulse CODE BLUE: called RRT at bedside RN staff at bedside ACLS protocol followed Pt received Epi pushes and Calcium x 1 easily bagged per RT At each pulse check in [place] Cardiology at bedside and discussed with family CODE stopped at 7:06 pt pulseless and pronounced at that time   Signed Dr Seward [facility] Loc[facility]  27-Jul-2017, 7:09 AM

## 2017-07-31 NOTE — Code Documentation (Signed)
Paged code blue to room.  Arrived with senior code pager holder Dr. Reesa Chew (Internal Medicine residency).  Critical Care team was already there running the code with adequate staffing.  We checked with CCM NP and were told that we were not needed.  -Dr. Criss Rosales, PGY1 FM (intern code pager)

## 2017-07-31 NOTE — Progress Notes (Signed)
CODE BLUE NOTE  Patient Name: [name]   MRN: [id number]   Date of Birth/ Sex: 1931/03/12 , female      Admission Date: 07/28/2017  Attending Provider: Adrian Prows, MD  Primary Diagnosis: <principal problem not specified>    Indication: Patient found to be PEA at 0656. On Levophed at 50 mcg, EPI 20 mcg, and Vasopressin at 0.03. CPR started and continued for 10 minutes. Family at bedside. Pronounced at 0706  Technical Description:  - CPR performance duration:  10 minute  - Was defibrillation or cardioversion used? No   - Was external pacer placed? Yes  - Was patient intubated pre/post CPR? Intubated pre-arrest     Medications Administered: Y = Yes; Blank = No Amiodarone    Atropine    Calcium  Y 1 amp   Epinephrine  Y x 4   Lidocaine    Magnesium    Norepinephrine    Phenylephrine    Sodium bicarbonate    Vasopressin     Hayden Pedro, AGACNP-BC [facility]  Pgr: [phone]  PCCM Pgr: [phone]

## 2017-07-31 NOTE — Progress Notes (Signed)
Time of death 0720, verified with Audry Riles RN. Emotional support provided to the family, chaplain at the bedside.

## 2017-07-31 NOTE — Progress Notes (Signed)
Midozolam 20mg /54ml wasted.  Witnessed by [place] McAlvain.

## 2017-07-31 DEATH — deceased

## 2017-09-05 ENCOUNTER — Other Ambulatory Visit: Payer: Self-pay | Admitting: Family Medicine

## 2017-12-09 ENCOUNTER — Ambulatory Visit: Payer: Medicare HMO | Admitting: Neurology

## 2017-12-17 ENCOUNTER — Ambulatory Visit: Payer: Medicare HMO | Admitting: Neurology
